# Patient Record
Sex: Female | Born: 1945
Health system: Southern US, Community
[De-identification: ages and names within clinical notes are randomized; demographics above are authoritative.]

## PROBLEM LIST (undated history)

## (undated) DIAGNOSIS — I739 Peripheral vascular disease, unspecified: Secondary | ICD-10-CM

## (undated) DIAGNOSIS — J189 Pneumonia, unspecified organism: Secondary | ICD-10-CM

## (undated) DIAGNOSIS — F17200 Nicotine dependence, unspecified, uncomplicated: Secondary | ICD-10-CM

## (undated) DIAGNOSIS — C140 Malignant neoplasm of pharynx, unspecified: Secondary | ICD-10-CM

## (undated) DIAGNOSIS — E785 Hyperlipidemia, unspecified: Secondary | ICD-10-CM

## (undated) DIAGNOSIS — R0989 Other specified symptoms and signs involving the circulatory and respiratory systems: Secondary | ICD-10-CM

## (undated) HISTORY — DX: Other specified symptoms and signs involving the circulatory and respiratory systems: R09.89

## (undated) HISTORY — PX: PEG TUBE PLACEMENT: SUR1034

## (undated) HISTORY — PX: PEG TUBE REMOVAL: SHX2187

## (undated) HISTORY — DX: Peripheral vascular disease, unspecified: I73.9

## (undated) HISTORY — PX: AORTA - BILATERAL FEMORAL ARTERY BYPASS GRAFT: SHX1175

## (undated) HISTORY — DX: Hyperlipidemia, unspecified: E78.5

## (undated) HISTORY — DX: Nicotine dependence, unspecified, uncomplicated: F17.200

## (undated) HISTORY — PX: TONSILLECTOMY: SUR1361

## (undated) HISTORY — PX: COLONOSCOPY: SHX174

## (undated) HISTORY — PX: TOTAL LARYNGECTOMY: SHX2543

## (undated) HISTORY — DX: Malignant neoplasm of pharynx, unspecified: C14.0

---

## 2008-06-13 ENCOUNTER — Ambulatory Visit: Payer: Self-pay | Admitting: Vascular Surgery

## 2008-06-27 ENCOUNTER — Encounter: Admission: RE | Admit: 2008-06-27 | Discharge: 2008-06-27 | Payer: Self-pay | Admitting: Vascular Surgery

## 2008-06-27 ENCOUNTER — Ambulatory Visit: Payer: Self-pay | Admitting: Vascular Surgery

## 2008-08-08 ENCOUNTER — Ambulatory Visit: Payer: Self-pay | Admitting: Vascular Surgery

## 2008-08-09 ENCOUNTER — Encounter: Payer: Self-pay | Admitting: Vascular Surgery

## 2008-08-09 ENCOUNTER — Ambulatory Visit: Payer: Self-pay | Admitting: Vascular Surgery

## 2008-08-09 ENCOUNTER — Inpatient Hospital Stay (HOSPITAL_COMMUNITY): Admission: RE | Admit: 2008-08-09 | Discharge: 2008-08-15 | Payer: Self-pay | Admitting: Vascular Surgery

## 2008-08-29 ENCOUNTER — Ambulatory Visit: Payer: Self-pay | Admitting: Vascular Surgery

## 2008-09-05 ENCOUNTER — Ambulatory Visit: Payer: Self-pay | Admitting: Vascular Surgery

## 2008-12-12 ENCOUNTER — Ambulatory Visit: Payer: Self-pay | Admitting: Vascular Surgery

## 2009-02-26 ENCOUNTER — Ambulatory Visit: Payer: Self-pay | Admitting: Vascular Surgery

## 2009-05-29 ENCOUNTER — Ambulatory Visit: Payer: Self-pay | Admitting: Vascular Surgery

## 2009-09-11 ENCOUNTER — Ambulatory Visit: Payer: Self-pay | Admitting: Vascular Surgery

## 2009-12-20 ENCOUNTER — Emergency Department (HOSPITAL_COMMUNITY): Admission: EM | Admit: 2009-12-20 | Discharge: 2009-12-20 | Payer: Self-pay | Admitting: Emergency Medicine

## 2009-12-27 ENCOUNTER — Ambulatory Visit: Payer: Self-pay | Admitting: Cardiology

## 2010-05-29 LAB — CBC
HCT: 39.7 % (ref 36.0–46.0)
MCHC: 33.5 g/dL (ref 30.0–36.0)
Platelets: 178 10*3/uL (ref 150–400)
RDW: 15.6 % — ABNORMAL HIGH (ref 11.5–15.5)
WBC: 16.3 10*3/uL — ABNORMAL HIGH (ref 4.0–10.5)

## 2010-05-29 LAB — COMPREHENSIVE METABOLIC PANEL
ALT: 25 U/L (ref 0–35)
Albumin: 3.7 g/dL (ref 3.5–5.2)
Calcium: 9.5 mg/dL (ref 8.4–10.5)
Glucose, Bld: 216 mg/dL — ABNORMAL HIGH (ref 70–99)
Potassium: 3.7 mEq/L (ref 3.5–5.1)
Sodium: 140 mEq/L (ref 135–145)
Total Protein: 6.1 g/dL (ref 6.0–8.3)

## 2010-05-29 LAB — DIFFERENTIAL
Eosinophils Absolute: 0 10*3/uL (ref 0.0–0.7)
Lymphs Abs: 1.1 10*3/uL (ref 0.7–4.0)
Monocytes Absolute: 1 10*3/uL (ref 0.1–1.0)
Monocytes Relative: 6 % (ref 3–12)
Neutro Abs: 14.2 10*3/uL — ABNORMAL HIGH (ref 1.7–7.7)
Neutrophils Relative %: 87 % — ABNORMAL HIGH (ref 43–77)

## 2010-05-29 LAB — URINALYSIS, ROUTINE W REFLEX MICROSCOPIC
Glucose, UA: 250 mg/dL — AB
Specific Gravity, Urine: 1.034 — ABNORMAL HIGH (ref 1.005–1.030)

## 2010-05-29 LAB — URINE MICROSCOPIC-ADD ON

## 2010-05-29 LAB — URINE CULTURE

## 2010-06-23 LAB — BASIC METABOLIC PANEL
BUN: 8 mg/dL (ref 6–23)
Calcium: 8.1 mg/dL — ABNORMAL LOW (ref 8.4–10.5)
Creatinine, Ser: 0.54 mg/dL (ref 0.4–1.2)
GFR calc non Af Amer: >60 mL/min (ref >60)

## 2010-06-23 LAB — CBC
MCV: 91 fL (ref 78.0–100.0)
Platelets: 219 10*3/uL (ref 150–400)
WBC: 8.2 10*3/uL (ref 4.0–10.5)

## 2010-06-23 LAB — BRAIN NATRIURETIC PEPTIDE: Pro B Natriuretic peptide (BNP): 109 pg/mL — ABNORMAL HIGH (ref 0.0–100.0)

## 2010-06-23 LAB — GLUCOSE, CAPILLARY
Glucose-Capillary: 115 mg/dL — ABNORMAL HIGH (ref 70–99)
Glucose-Capillary: 119 mg/dL — ABNORMAL HIGH (ref 70–99)

## 2010-06-24 LAB — CBC
HCT: 26.8 % — ABNORMAL LOW (ref 36.0–46.0)
HCT: 27.3 % — ABNORMAL LOW (ref 36.0–46.0)
HCT: 29.9 % — ABNORMAL LOW (ref 36.0–46.0)
Hemoglobin: 10.1 g/dL — ABNORMAL LOW (ref 12.0–15.0)
Hemoglobin: 7.7 g/dL — CL (ref 12.0–15.0)
Hemoglobin: 9.2 g/dL — ABNORMAL LOW (ref 12.0–15.0)
Hemoglobin: 9.4 g/dL — ABNORMAL LOW (ref 12.0–15.0)
Hemoglobin: 9.5 g/dL — ABNORMAL LOW (ref 12.0–15.0)
MCHC: 33.5 g/dL (ref 30.0–36.0)
MCHC: 34.2 g/dL (ref 30.0–36.0)
MCV: 86.4 fL (ref 78.0–100.0)
MCV: 87.5 fL (ref 78.0–100.0)
MCV: 88.2 fL (ref 78.0–100.0)
Platelets: 66 10*3/uL — ABNORMAL LOW (ref 150–400)
Platelets: 73 10*3/uL — ABNORMAL LOW (ref 150–400)
RBC: 2.57 MIL/uL — ABNORMAL LOW (ref 3.87–5.11)
RBC: 3.04 MIL/uL — ABNORMAL LOW (ref 3.87–5.11)
RBC: 3.12 MIL/uL — ABNORMAL LOW (ref 3.87–5.11)
RBC: 3.37 MIL/uL — ABNORMAL LOW (ref 3.87–5.11)
RBC: 3.46 MIL/uL — ABNORMAL LOW (ref 3.87–5.11)
RBC: 5.11 MIL/uL (ref 3.87–5.11)
RDW: 14.3 % (ref 11.5–15.5)
RDW: 14.4 % (ref 11.5–15.5)
WBC: 10.1 10*3/uL (ref 4.0–10.5)
WBC: 10.9 10*3/uL — ABNORMAL HIGH (ref 4.0–10.5)
WBC: 6.9 10*3/uL (ref 4.0–10.5)
WBC: 9.9 10*3/uL (ref 4.0–10.5)

## 2010-06-24 LAB — BASIC METABOLIC PANEL
BUN: 3 mg/dL — ABNORMAL LOW (ref 6–23)
BUN: 5 mg/dL — ABNORMAL LOW (ref 6–23)
CO2: 19 mEq/L (ref 19–32)
CO2: 29 mEq/L (ref 19–32)
Calcium: 6.1 mg/dL — CL (ref 8.4–10.5)
Calcium: 8.1 mg/dL — ABNORMAL LOW (ref 8.4–10.5)
Chloride: 103 mEq/L (ref 96–112)
Chloride: 108 mEq/L (ref 96–112)
Chloride: 109 mEq/L (ref 96–112)
Creatinine, Ser: 0.56 mg/dL (ref 0.4–1.2)
GFR calc Af Amer: >60 mL/min (ref >60)
GFR calc Af Amer: >60 mL/min (ref >60)
GFR calc Af Amer: >60 mL/min (ref >60)
GFR calc non Af Amer: >60 mL/min (ref >60)
Glucose, Bld: 147 mg/dL — ABNORMAL HIGH (ref 70–99)
Potassium: 3 mEq/L — ABNORMAL LOW (ref 3.5–5.1)
Potassium: 3.9 mEq/L (ref 3.5–5.1)
Potassium: 4.1 mEq/L (ref 3.5–5.1)
Sodium: 134 mEq/L — ABNORMAL LOW (ref 135–145)
Sodium: 136 mEq/L (ref 135–145)
Sodium: 137 mEq/L (ref 135–145)
Sodium: 138 mEq/L (ref 135–145)
Sodium: 139 mEq/L (ref 135–145)

## 2010-06-24 LAB — GLUCOSE, CAPILLARY
Glucose-Capillary: 120 mg/dL — ABNORMAL HIGH (ref 70–99)
Glucose-Capillary: 124 mg/dL — ABNORMAL HIGH (ref 70–99)
Glucose-Capillary: 130 mg/dL — ABNORMAL HIGH (ref 70–99)
Glucose-Capillary: 132 mg/dL — ABNORMAL HIGH (ref 70–99)
Glucose-Capillary: 142 mg/dL — ABNORMAL HIGH (ref 70–99)
Glucose-Capillary: 146 mg/dL — ABNORMAL HIGH (ref 70–99)
Glucose-Capillary: 162 mg/dL — ABNORMAL HIGH (ref 70–99)

## 2010-06-24 LAB — TYPE AND SCREEN
ABO/RH(D): AB NEG
Antibody Screen: POSITIVE
Donor AG Type: NEGATIVE
Donor AG Type: NEGATIVE
PT AG Type: NEGATIVE

## 2010-06-24 LAB — POCT I-STAT 3, ART BLOOD GAS (G3+)
Acid-Base Excess: 2 mmol/L (ref 0.0–2.0)
Acid-base deficit: 4 mmol/L — ABNORMAL HIGH (ref 0.0–2.0)
Acid-base deficit: 8 mmol/L — ABNORMAL HIGH (ref 0.0–2.0)
Bicarbonate: 17.1 mEq/L — ABNORMAL LOW (ref 20.0–24.0)
Bicarbonate: 22.3 mEq/L (ref 20.0–24.0)
Bicarbonate: 25.2 mEq/L — ABNORMAL HIGH (ref 20.0–24.0)
O2 Saturation: 94 %
O2 Saturation: 95 %
O2 Saturation: 96 %
O2 Saturation: 96 %
Patient temperature: 98.4
TCO2: 18 mmol/L (ref 0–100)
TCO2: 19 mmol/L (ref 0–100)
TCO2: 21 mmol/L (ref 0–100)
TCO2: 24 mmol/L (ref 0–100)
pCO2 arterial: 36.3 mmHg (ref 35.0–45.0)
pCO2 arterial: 37.3 mmHg (ref 35.0–45.0)
pH, Arterial: 7.267 — ABNORMAL LOW (ref 7.350–7.400)
pH, Arterial: 7.308 — ABNORMAL LOW (ref 7.350–7.400)
pO2, Arterial: 102 mmHg — ABNORMAL HIGH (ref 80.0–100.0)
pO2, Arterial: 129 mmHg — ABNORMAL HIGH (ref 80.0–100.0)
pO2, Arterial: 67 mmHg — ABNORMAL LOW (ref 80.0–100.0)
pO2, Arterial: 81 mmHg (ref 80.0–100.0)
pO2, Arterial: 86 mmHg (ref 80.0–100.0)
pO2, Arterial: 94 mmHg (ref 80.0–100.0)

## 2010-06-24 LAB — COMPREHENSIVE METABOLIC PANEL
ALT: 12 U/L (ref 0–35)
ALT: 9 U/L (ref 0–35)
AST: 16 U/L (ref 0–37)
AST: 20 U/L (ref 0–37)
CO2: 19 mEq/L (ref 19–32)
CO2: 22 mEq/L (ref 19–32)
Calcium: 9.9 mg/dL (ref 8.4–10.5)
Chloride: 106 mEq/L (ref 96–112)
GFR calc Af Amer: >60 mL/min (ref >60)
GFR calc Af Amer: >60 mL/min (ref >60)
GFR calc non Af Amer: >60 mL/min (ref >60)
Sodium: 133 mEq/L — ABNORMAL LOW (ref 135–145)
Sodium: 142 mEq/L (ref 135–145)
Total Bilirubin: 1.8 mg/dL — ABNORMAL HIGH (ref 0.3–1.2)
Total Protein: 6.8 g/dL (ref 6.0–8.3)

## 2010-06-24 LAB — POCT I-STAT 4, (NA,K, GLUC, HGB,HCT): Hemoglobin: 10.5 g/dL — ABNORMAL LOW (ref 12.0–15.0)

## 2010-06-24 LAB — POCT I-STAT 7, (LYTES, BLD GAS, ICA,H+H)
Acid-base deficit: 1 mmol/L (ref 0.0–2.0)
Bicarbonate: 26.2 mEq/L — ABNORMAL HIGH (ref 20.0–24.0)
Calcium, Ion: 1.21 mmol/L (ref 1.12–1.32)
HCT: 31 % — ABNORMAL LOW (ref 36.0–46.0)
Hemoglobin: 10.5 g/dL — ABNORMAL LOW (ref 12.0–15.0)
O2 Saturation: 100 %
Patient temperature: 35.1
Patient temperature: 35.2
Potassium: 3.9 mEq/L (ref 3.5–5.1)
Sodium: 138 mEq/L (ref 135–145)
pCO2 arterial: 49.1 mmHg — ABNORMAL HIGH (ref 35.0–45.0)
pH, Arterial: 7.346 — ABNORMAL LOW (ref 7.350–7.400)
pO2, Arterial: 447 mmHg — ABNORMAL HIGH (ref 80.0–100.0)
pO2, Arterial: 482 mmHg — ABNORMAL HIGH (ref 80.0–100.0)

## 2010-06-24 LAB — BLOOD GAS, ARTERIAL
Drawn by: 181601
FIO2: 0.21 %
O2 Saturation: 97.5 %
pCO2 arterial: 36.7 mmHg (ref 35.0–45.0)
pO2, Arterial: 83.4 mmHg (ref 80.0–100.0)

## 2010-06-24 LAB — PROTIME-INR
INR: 1.7 — ABNORMAL HIGH (ref 0.00–1.49)
Prothrombin Time: 18.5 seconds — ABNORMAL HIGH (ref 11.6–15.2)

## 2010-06-24 LAB — APTT: aPTT: 40 seconds — ABNORMAL HIGH (ref 24–37)

## 2010-06-24 LAB — CARDIAC PANEL(CRET KIN+CKTOT+MB+TROPI)
Total CK: 431 U/L — ABNORMAL HIGH (ref 7–177)
Troponin I: 0.03 ng/mL (ref 0.00–0.06)

## 2010-06-24 LAB — AMYLASE: Amylase: 58 U/L (ref 27–131)

## 2010-07-09 IMAGING — CR DG CHEST 1V PORT
1 series · 1 of 1 positions shown · non-contrast
Comparison: 08/09/2008.

CLINICAL DATA: Postop aortic bypass surgery.  Swan-Ganz catheter
placement.

PORTABLE CHEST - 1 VIEW

[view not recorded]
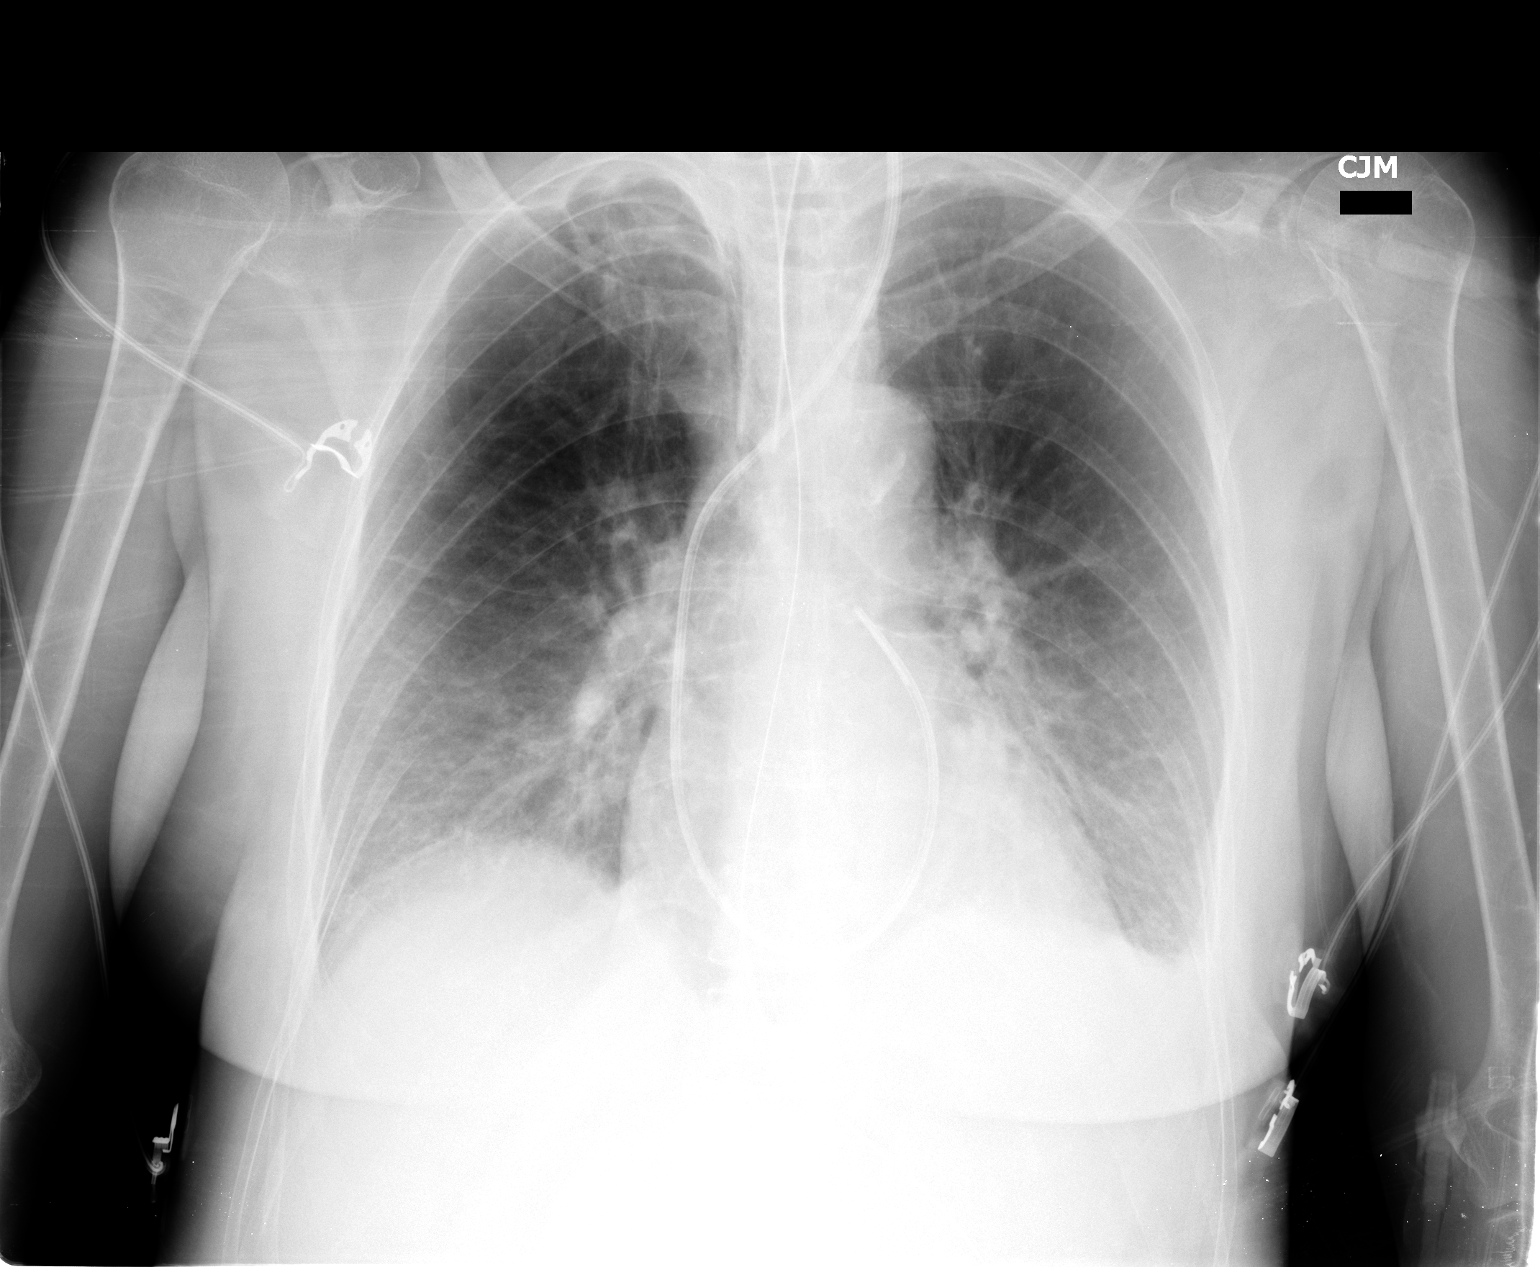

[1 of 1 positions shown; findings below may reference images not displayed]

FINDINGS: 6065 hours.  The tip of the endotracheal tube is 2.0 cm
above the carina.  A left internal jugular Swan-Ganz catheter tip
is in the main pulmonary artery.  A nasogastric tube projects below
the diaphragm.  There is bibasilar atelectasis with small bilateral
pleural effusions.  No pneumothorax is present.  The heart size and
mediastinal contours appear stable allowing for the lower lung
volumes.  Right apical scarring appears stable.
IMPRESSION: 1.  Support system positioned as above.
2.  Small pleural effusions and basilar atelectasis.  No
pneumothorax.

## 2010-07-28 ENCOUNTER — Telehealth: Payer: Self-pay | Admitting: Cardiology

## 2010-07-28 NOTE — Telephone Encounter (Signed)
Patient was prescribed Lipitor, Zetia, and Meteprolol, does she need to continue to take these.  Last seen Oct 2011.  Please call, they were not sure how long she has to take these.

## 2010-07-29 ENCOUNTER — Telehealth: Payer: Self-pay | Admitting: Cardiology

## 2010-07-29 NOTE — Discharge Summary (Signed)
NAMEMATEJA, DIER               ACCOUNT NO.:  [id number]   MEDICAL RECORD NO.:  [id number]          PATIENT TYPE:  INP   LOCATION:  2030                         FACILITY:  [facility]   PHYSICIAN:  Janetta Hora. Fields, MD  DATE OF BIRTH:  March 22, 1945   DATE OF ADMISSION:  08/09/2008  DATE OF DISCHARGE:  08/15/2008                               DISCHARGE SUMMARY   FINAL DISCHARGE DIAGNOSES:  1. Chronic aortic occlusion with risk of limb loss.  2. Dyslipidemia.   PROCEDURES PERFORMED:  Aortobifemoral bypass grafting with bilateral  common femoral endarterectomy with right femoral to above knee popliteal  bypass grafting with nonreversed right greater saphenous vein.  Intraoperative arteriogram x1 by Dr. Darrick Penna, Aug 09, 2008.   COMPLICATIONS:  None.   CONDITION ON DISCHARGE:  Stable, improving.   DISCHARGE MEDICATIONS:  1. Metoprolol succinate 25 mg p.o. q.a.m.  2. Lipitor 40 mg p.o. q.p.m.  3. Aspirin 81 mg p.o. q.p.m.  4. Aleve p.r.n.  5. Percocet 5/325 one p.o. q.4 h. p.r.n. pain, total #40 were given.   DISPOSITION:  She is being discharged home in stable condition with her  wounds healing well.  She is given careful instructions regarding the  care of her wounds and her activity level.  She is given a return  appointment with Dr. Darrick Penna in 2 weeks for staple removal, ABIs, and  followup.   BRIEF IDENTIFYING STATEMENT:  For complete details, please refer the  typed history and physical.  Briefly, this 65 year old woman was  referred to Dr. Darrick Penna with chronic aortic occlusion.  Dr. Darrick Penna found  her to have a very high risk for limb loss.  He recommended  aortobifemoral bypass grafting with a right femoral through popliteal  bypass grafting and endarterectomies of both common femoral arteries.  She was informed of the risks and benefits of the procedure and after  careful consideration, she elected to proceed with surgery.   HOSPITAL COURSE:  Preoperative workup was completed  as an outpatient.  She was brought in through same-day surgery and underwent the  aforementioned revascularization procedure.  For complete details,  please refer the typed operative report.  The procedure was without  complications.  She was returned to an intensive care unit in critical,  but hemodynamically stable condition.  Mechanical ventilation was  weaned, since she was able to be extubated on the first postoperative  day.  She was transferred to a bed on the surgical step down unit, Jul 16, 2008.  On August 14, 2008, she was able to be transferred to a bed on a  surgical convalescent floor.   Postoperatively, she did have some oxygenation issues which eventually  resolved.  Her activity level and diet were advanced as she tolerated.  By August 15, 2008, she was walking and improving with physical therapy.  She was tolerating a regular diet.  Her wounds were healing well and she  was felt stable for discharge.  She was discharged in stable condition  on August 15, 2008.      Wilmon Arms, PA      Leonette Most  Holland Commons, MD  Electronically Signed    KEL/MEDQ  D:  08/15/2008  T:  08/15/2008  Job:  [id number]   cc:   Janetta Hora. Darrick Penna, MD

## 2010-07-29 NOTE — Assessment & Plan Note (Signed)
OFFICE VISIT   Heather Jimenez, Heather Jimenez  DOB:  [birth year]                                       09/05/2008  ZOXWR#:[id number]   The patient returns for followup today after undergoing aortobifemoral  bypass graft with bilateral common femoral endarterectomy and right  femoral to above knee popliteal bypass with vein.  She recovered well  postoperatively.  She returns today for her postop visit.   On exam today abdominal and groin incisions are well-healed.  The leg  incisions on her right leg are well-healed.  She has a 1+ DP pulse in  the right foot.  She states that she essentially has no claudication  symptoms at this point.  The ulcer on her right toe is almost healed at  this point.  ABIs today were 1.02 on the right and 0.71 on the left.  These have improved dramatically from 0 on the right and 0.37 on the  left preoperatively.  She had biphasic waveforms bilaterally.   Overall the patient seems to be doing well.  Unfortunately she continues  to smoke and I counseled her against this today.  She will follow up on  our graft surveillance protocol in three months' time.   Janetta Hora. Fields, MD  Electronically Signed   CEF/MEDQ  D:  09/05/2008  T:  09/06/2008  Job:  [id number]   cc:   Gillis Ends, Dr  Elmore Guise., M.D.

## 2010-07-29 NOTE — Assessment & Plan Note (Signed)
OFFICE VISIT   TERISSA, HAFFEY  DOB:  17-Jan-1946                                       08/08/2008  ZOXWR#:[id number]   The patient was last seen on 06/27/2008.  At that time it was  recommended to her she needed aortobifemoral bypass grafting.  However,  she refused at that time.  The patient now presents and is now willing  to undergo the procedure.  Unfortunately she has had some deterioration  of her right foot.  She has developed an ulceration on the medial aspect  of her right first toe and she has some erythema in the foot as well.   I believe the best option for limb salvage is aortobifemoral bypass  grafting and possibly a right femoral-popliteal bypass as well.  Risks,  benefits, possible complications and procedure details including but not  limited to bleeding, infection, myocardial infarction, limb loss were  explained to the patient today.  She understands and agrees to proceed.  The procedure is scheduled for 08/09/2008.  A full history and physical  is dictated on the [facility] system.   Janetta Hora. Fields, MD  Electronically Signed   CEF/MEDQ  D:  08/08/2008  T:  08/09/2008  Job:  [id number]

## 2010-07-29 NOTE — Procedures (Signed)
BYPASS GRAFT EVALUATION   INDICATION:  Follow up right lower extremity bypass graft.   HISTORY:  Diabetes:  No.  Cardiac:  No.  Hypertension:  No.  Smoking:  Yes.  Previous Surgery:  Right femoral to above-knee bypass graft, 08/09/08.   SINGLE LEVEL ARTERIAL EXAM                               RIGHT              LEFT  Brachial:                    113                118  Anterior tibial:             110                78  Posterior tibial:            96                 111  Peroneal:  Ankle/brachial index:        0.93               0.94   PREVIOUS ABI:  Date: 05/29/09  RIGHT:  0.93  LEFT:  0.76   LOWER EXTREMITY BYPASS GRAFT DUPLEX EXAM:   DUPLEX:  Patent right femoral to above-knee bypass graft with biphasic  waveforms noted proximal and within the graft.  There are elevated  velocities of 318 cm at the distal anastomosis.   IMPRESSION:  1. Patient's right ankle brachial index is stable; however, the left      ankle brachial index has increased from previous study.  2. Stable right femoral to above-knee bypass graft with elevated      velocity, as noted above.    ___________________________________________  Janetta Hora. Fields, MD   CB/MEDQ  D:  09/11/2009  T:  09/11/2009  Job:  [id number]

## 2010-07-29 NOTE — Procedures (Signed)
CAROTID DUPLEX EXAM   INDICATION:  Bruit.   HISTORY:  Diabetes:  No.  Cardiac:  No.  Hypertension:  No.  Smoking:  Yes.  Previous Surgery:  __________.  CV History:  Asymptomatic  Amaurosis Fugax No, Paresthesias No, Hemiparesis No.                                       RIGHT             LEFT  Brachial systolic pressure:         120               102  Brachial Doppler waveforms:         Normal.           Abnormal.  Vertebral direction of flow:        Antegrade.  Antegrade/atypical.  DUPLEX VELOCITIES (cm/sec)  CCA peak systolic                   79.               100.  ECA peak systolic                   354.              137.  ICA peak systolic                   167.              86.  ICA end diastolic                   40.               27.  PLAQUE MORPHOLOGY:                  Mixed.            Mixed.  PLAQUE AMOUNT:                      Moderate.         Mild.  PLAQUE LOCATION:                    ICA/ECA.          ICA/ECA.   IMPRESSION:  1. 40 TO 59% stenosis of the internal carotid artery.  2. 1 to 39% stenosis of the left internal carotid artery.  3. There is an elevated velocity of greater than 300 cm per second      noted in the left proximal subclavian artery with the antegrade      left vetebral artery demonstrating early systolic deceleration.  4. A mild difference in the bilateral brachial pressures is noted.   ___________________________________________    Janetta Hora Fields, MD   CH/MEDQ  D:  06/13/2008  T:  06/13/2008  Job:  [id number]

## 2010-07-29 NOTE — Procedures (Signed)
BYPASS GRAFT EVALUATION   INDICATION:  Followup lower extremity bypass graft.   HISTORY:  Diabetes:  No.  Cardiac:  No.  Hypertension:  No.  Smoking:  Yes.  Previous Surgery:  Aortobifemoral and right fem-pop bypass graft on  08/09/2008.   SINGLE LEVEL ARTERIAL EXAM                               RIGHT              LEFT  Brachial:                    123                120  Anterior tibial:             117                100  Posterior tibial:            Not detected       104  Peroneal:                    116  Ankle/brachial index:        0.95               0.85   PREVIOUS ABI:  Date:  12/12/2008  RIGHT:  1.03  LEFT:  0.66   LOWER EXTREMITY BYPASS GRAFT DUPLEX EXAM:   DUPLEX:  Biphasic Doppler waveforms noted throughout the aortobifemoral  bypass graft with no increased velocities noted.  Biphasic Doppler waveforms noted throughout the right lower extremity  bypass graft with an increased velocity of 327 cm/s noted at the distal  anastomosis.   IMPRESSION:  1. Patent aortobifemoral and right femoral-popliteal bypass grafts      with an increased velocity noted, as described above.  2. Stable right ankle brachial index with a significant increase in      the left ankle brachial index noted.   ___________________________________________  Janetta Hora Fields, MD   CH/MEDQ  D:  02/26/2009  T:  02/27/2009  Job:  [id number]

## 2010-07-29 NOTE — Assessment & Plan Note (Signed)
OFFICE VISIT   Heather Jimenez, Heather Jimenez  DOB:  [birth year]                                       06/27/2008  UEAVW#:[id number]   The patient is a 65 year old female who was last seen on March 31 with  signs and symptoms of aortic occlusion.  She returns today for further  followup after a CT angiogram.   On physical exam today blood pressure is 105/72, pulse is 121 and  regular.  Lower extremities:  She has some dependent rubor of the right  foot.  Left foot is pale.  She has absent femoral and pedal pulses  bilaterally.  She has no ulcerations on the feet.   Review of her CT angiogram today shows a distal aortic occlusion with a  large amount of thrombus lining the infrarenal aorta and extending up  into the suprarenal aorta.  She has bilateral severe common femoral  disease.  The profunda femoris arteries are patent bilaterally.  She has  bilateral superficial femoral artery occlusions with two-vessel runoff  to the right and three-vessel runoff to the left.   I discussed with the patient today that she would need aortobifemoral  bypass grafting with most likely bilateral femoral endarterectomies and  possible profundoplasty.  Risks, benefits, possible complications and  procedure details were explained to the patient including bleeding,  infection, graft thrombosis, limb loss, myocardial events, pulmonary  events.  She understands and agrees to proceed.  She is scheduled for a  cardiac stress test on April 19 with [facility].  She will  than have had an echocardiogram on April 20 to evaluate her possible  aortic stenosis.  Tentatively her aortobifemoral bypass is scheduled for  April 27.   Janetta Hora. Fields, MD  Electronically Signed   CEF/MEDQ  D:  06/28/2008  T:  06/28/2008  Job:  [id number]   cc:   Elmore Guise., M.D.  Dr Gillis Ends

## 2010-07-29 NOTE — H&P (Signed)
Heather Jimenez, Heather Jimenez NO.:  [id number]   MEDICAL RECORD NO.:  [id number]          PATIENT TYPE:  INP   LOCATION:  2899                         FACILITY:  [facility]   PHYSICIAN:  Janetta Hora. Fields, MD  DATE OF BIRTH:  [birth year]   DATE OF ADMISSION:  08/09/2008  DATE OF DISCHARGE:                              HISTORY & PHYSICAL   The patient is a 65 year old female who was initially seen on June 13, 2008.  She was referred for right foot pain which had been going on for  approximately 3 weeks.  Her past medical history is primarily  significant for heavy smoking up to a pack and a half per day for many  years.  At that time, she denied any problems with nonhealing wounds on  her foot.  She denies history of hypertension, elevated cholesterol, or  diabetes.  Since that time, she still does not really have significant  pain in her foot, but says it hurts occasionally and this is relieved by  1 or 2 aspirin.  However, she has had deterioration of her right foot  with development of an ulceration on the medial aspect of her right  first toe.  She has no symptoms in her left leg.  Recent CT angiogram  showed distal aortic occlusion with a large amount of thrombus lying in  the infrarenal aorta extending into the suprarenal aorta.  She also has  bilateral severe common femoral disease.  The profunda femoris is patent  bilaterally.  She has bilateral superficial femoral artery occlusions  with 2-vessel runoff to the right foot and 3-vessel runoff to the left  foot.   PAST SURGICAL HISTORY:  She had a tonsillectomy.  She had a left foot  abscess 3 years ago.   MEDICATIONS:  She takes Darvocet 1 tablet 4 times a day, Cipro which has  now been discontinued, aspirin 325 mg once a day.   PHYSICAL EXAMINATION:  VITAL SIGNS:  Blood pressure is 129/77 in the  left arm, pulse is 74 and regular.  HEENT:  Unremarkable.  NECK:  2+ carotid pulses with bilateral carotid bruits.  CHEST:  Clear to auscultation.  CARDIAC:  A 4/6 systolic murmur heard throughout the precordium  radiating to the neck.  She has 2+ brachial pulses bilaterally.  She has  a 2+ right radial pulse.  She has a 1+ left radial pulse.  ABDOMEN:  Thin, soft, nontender, and nondistended.  Good palpable aortic  pulsation.  She has no masses.  EXTREMITIES:  She has absent femoral popliteal and pedal pulses  bilaterally.  Feet are cool bilaterally.  She has dependent rubor in the  right forefoot.  She has an ulceration on the medial aspect of the right  first toe with no purulent drainage, but some clearish discharge.   She had bilateral ABIs performed on June 14, 2008, which were 0.37 on  the left with absent Doppler flow on the right.  She had a mild blood  pressure discrepancy with the right arm being 120 mmHg and the left  being 120 mmHg suggesting left  subclavian stenosis.  She also had a  carotid duplex exam which showed a 40-60% right internal carotid artery  stenosis and less than 40% left internal carotid artery stenosis.   She also recently had a full cardiology evaluation by Dr. Reyes Ivan with  [facility].  This included an echocardiogram and stress test  which are included with her preoperative packet.   In summary, the patient has a chronic aortic occlusion and has risk of  limb loss especially in her right lower extremity.  Plan is for  aortobifemoral bypass grafting which will most likely also require  bilateral femoral endarterectomy and potentially right femoral popliteal  bypass grafting as well.  Risks, benefits, possible  complications, and procedure details were explained to the patient today  including but not limited to bleeding, infection, limb loss, myocardial  infarction.  She understands and agrees to proceed.  Procedure is  scheduled for Aug 09, 2008.      Janetta Hora. Fields, MD  Electronically Signed     CEF/MEDQ  D:  08/08/2008  T:  08/09/2008  Job:   [id number]

## 2010-07-29 NOTE — Assessment & Plan Note (Signed)
OFFICE VISIT   Heather Jimenez, Heather Jimenez  DOB:  1946/02/14                                       06/13/2008  FAOZH#:[id number]   The patient is a 65 year old female referred by Dr. Gillis Ends for  right foot pain for 3 weeks.  She was previously seen in the emergency  room in [place] 2 days ago and was put on Cipro and Darvocet.  She was  seen by Dr. Manson Passey with [facility] urgent care during that visit.  The  patient denies any claudication symptoms.  However, she has been  developing rest pain in her right foot for approximately 3 weeks.  She  denies history of hypertension, diabetes, elevated cholesterol or  coronary disease.  She is a heavy smoker and has been a pack and a half  per day smoker for many years.  She has not really had any problems with  nonhealing wounds on her foot.   PAST MEDICAL HISTORY:  Is otherwise fairly unremarkable.   PAST SURGICAL HISTORY:  She had a tonsillectomy.  She had a left foot  abscess 3 years ago.   MEDICATIONS:  1. Darvocet 1 tablet four times a day.  2. Cipro 1 tablet twice a day.  3. She is also currently on aspirin 325 mg once a day as well.   ALLERGIES:  She has no known drug allergies.   FAMILY HISTORY:  Unremarkable.   SOCIAL HISTORY:  She is married and has five children.  Smoking history  is as above.  She does not consume alcohol regularly.   REVIEW OF SYSTEMS:  CONSTITUTIONAL:  She is 5 feet 3 inches, 102 pounds.  Cardiac, GI, renal, neurologic, orthopedic, psychiatric, ENT and  hematologic review of systems are all negative.  VASCULAR:  She denies history of TIA or stroke or DVT.  PULMONARY:  She denies history of asthma or COPD but cannot climb two  flights of stairs without becoming short of breath.   PHYSICAL EXAM:  Blood pressure is 102/72 in the left arm, pulse is 98  and regular.  HEENT is unremarkable.  Neck has bilateral carotid bruits.  Chest is clear to auscultation.  She has a 4/6 systolic murmur  heard  throughout the precordium and radiating to her neck.  She has 2+  brachial pulses bilaterally.  She has a 2+ right radial pulse.  She has  a 1+ left radial pulse.  Abdomen is thin, soft, nontender, nondistended  with palpable aortic pulsation.  She has no masses.  Extremities, she  has absent femoral, popliteal and pedal pulses bilaterally.  Feet are  cool with no edema.  She has no ulcerations on the feet.  The color is  slightly dusky bilaterally.   She had bilateral ABIs performed today which were 0.37 on the left and  absent Doppler flow on the right side.  She also had mild blood pressure  discrepancy with a systolic pressure of 120 mmHg on the right side and  102 mmHg on the left side suggesting left subclavian stenosis.  In light  of her bilateral carotid bruits we also did a carotid duplex exam which  showed a 40-60% right internal carotid artery stenosis, less than 40%  left internal carotid artery stenosis with antegrade vertebral flow  bilaterally and again some suggestion of left proximal subclavian  stenosis.  In summary, the patient has evidence clinically of aortic occlusion.  This is chronic in nature due to her overall symptoms.  Her primary risk  factor is smoking.  I discussed with her at length today smoking  cessation.  I also gave her a brochure and numbers for the [place]  smoking cessation clinic.  She will most likely need aortobifemoral  bypass to reperfuse her lower extremities.  Since she also has a very  harsh cardiac murmur and certainly has cardiac risk factors I have also  scheduled her an appointment with [facility] for a  preoperative evaluation and further evaluation of her cardiac murmur.  She apparently did have rheumatic fever as a child and may have some  valvular compromise secondary to this or her atherosclerosis.  She will  return for followup with me after her cardiology evaluation.  We will  also arrange a CT angiogram  of the abdomen, pelvis with lower extremity  runoff for further evaluation of her aortic occlusive disease.   Janetta Hora. Fields, MD  Electronically Signed   CEF/MEDQ  D:  06/14/2008  T:  06/14/2008  Job:  [id number]   cc:   Dr Gillis Ends

## 2010-07-29 NOTE — Op Note (Signed)
Heather Jimenez, Heather Jimenez               ACCOUNT NO.:  [id number]   MEDICAL RECORD NO.:  [id number]          PATIENT TYPE:  INP   LOCATION:  2316                         FACILITY:  [facility]   PHYSICIAN:  Janetta Hora. Fields, MD  DATE OF BIRTH:  1945-11-07   DATE OF PROCEDURE:  08/09/2008  DATE OF DISCHARGE:                               OPERATIVE REPORT   PROCEDURES:  1. Aortobifemoral bypass graft.  2. Bilateral common femoral endarterectomy.  3. Right femoral to above-knee popliteal bypass with nonreversed right      greater saphenous vein.  4. Intraoperative arteriogram x1.   PREOPERATIVE DIAGNOSIS:  Bilateral lower extremity ischemia with tissue  loss, right foot.   POSTOPERATIVE DIAGNOSIS:  Bilateral lower extremity ischemia with tissue  loss, right foot.   ANESTHESIA:  General.   SURGEON:  Charles E. Fields, MD   ASSISTANT:  1. Larina Earthly, MD  2. Wilmon Arms, PA-C   OPERATIVE FINDINGS:  1. 16 x 8-mm Dacron graft, end-to-side anastomosis.  2. 2.5 x 3-mm right greater saphenous vein.  3. Patent right femoral to above-knee popliteal bypass with three-      vessel runoff to the right foot.   OPERATIVE DETAILS:  After obtaining informed consent, the patient was  taken to the operating room.  The patient was placed in supine position  on the operating table.  After induction of general anesthesia and  endotracheal intubation, the patient was prepped from the nipples down  to the toes bilaterally.  Next, a right groin incision was made over the  area of the right common femoral artery.  This was carried down through  the subcutaneous tissues down to the level of the artery.  The artery  was dissected free circumferentially.  The artery was occluded and  heavily calcified with atherosclerotic plaque.  The artery was dissected  free circumferentially and dissection carried up onto the level of the  external iliac artery underneath the inguinal ligament.  Circumflex  iliac  branches were dissected free circumferentially and vessel loops  were placed around these.  The profunda femoris and superficial femoral  arteries were also dissected free circumferentially.  The proximal  portion of the profunda femoris artery was heavily calcified, but was  more soft down at the level of the first branch points.  The profunda  femoris artery was approximately 2 mm in diameter.  Next, a similar  incision was made on the left leg.  A longitudinal incision was made  over the level of the left common femoral artery carried down through  the subcutaneous tissues down to the level of the left common femoral  artery.  Common femoral arteries were dissected free circumferentially.  The profunda femoris and superficial femoral arteries were of similar  nature in character as the right side.  These were dissected free  circumferentially and vessel loops were placed around these.  Vessel  loops were also placed around the circumflex iliac branches.  Retroperitoneal tunnels were begun underneath the inguinal ligament and  the circumflex iliac vein crossing the artery was divided between silk  ties bilaterally.  Next, on the right side, the saphenofemoral junction  was dissected free.  Small side branches were ligated and divided  between silk ties.  Moist Ray-Tec sponges were then placed in each groin  and a midline laparotomy incision was then performed extending from the  xiphoid down to the pubis.  This was carried down through the  subcutaneous tissues.  There were several large collateral branches in  the subcutaneous tissues and bleeding was controlled with cautery.  Incision was carried down at the level of the fascia.  The fascia and  peritoneum were opened with a full lengthy incision.  Abdomen was  inspected.  There were mild changes of the liver consistent with  possible early cirrhotic changes.  There was also some blunting of the  edge of the liver.  The remainder of  the abdomen was without significant  pathology.  The left and right ovaries were small and normal size for  age.  Next, a small bowel was reflected to the right transverse colon,  greater omentum was reflected superiorly and Omni retractor was brought  up in the operative field to assist in retraction.  Dissection was  carried down to the level of the retroperitoneum and the abdominal aorta  was exposed on its anterior surface.  The inferior mesenteric artery was  fairly large in character approximately 3 mm in diameter.  This was  dissected free circumferentially and vessel loop placed around this.  Dissection was carried up to the level of the left renal vein.  This was  retracted slightly, superiorly.  The inferior portion of the right renal  artery was identified and a suitable place for clamping was made just  below this.  Due to the fact that the patient had no significant pelvic  flow, due to the aortic occlusion, it was decided that an end-to-side  anastomosis would be performed.  Next, retroperitoneal tunnels were  created from the abdomen to the left groin and right groin.  These were  retroperitoneal and retroureteral bilaterally.  Umbilical tapes were  placed below these.  The patient was then given 5000 units of  intravenous heparin.  The patient was also given additional units of  heparin during the course with a totaled of 15,000 units of heparin.   Next, the IMA was controlled with vessel loop.  The aorta was clamped  just below the level of the renal arteries and just at the level of the  inferior mesenteric artery.  A longitudinal opening was made in the  abdominal aorta.  There was some thrombus and atherosclerotic debris in  the abdominal aorta at this level and this was thoroughly irrigated and  removed under direct vision.  A 16 x 8-mm Dacron graft was then brought  up in the operative field and sewn end of graft to side of artery using  a running 3-0 Prolene suture.   Just prior to completion of anastomosis,  this was tested and found to be leaking some at the very top portion at  approximately 3 o'clock position.  These were repaired with two  pledgeted sutures.  Next, the clamp was removed and there was good  pulsatile flow in both limbs immediately.  Attention was then turned to  the right limb.  This was brought through the retroperitoneal tunnel  down to the level of the right common femoral artery.  Right common  femoral artery was controlled proximally with a Cooley clamp and the  profunda femoris and superficial femoral artery  was controlled with  vessel loops.  Longitudinal opening was made in the right common femoral  artery and there was a large amount of atherosclerotic plaque  essentially completely obstructing the artery.  Endarterectomy was begun  in a suitable plane and a portion of the distal external iliac artery  was also endarterectomized bluntly.  A good endpoint was made on the  profunda femoris on the right side.  The superficial femoral artery was  chronically occluded.  The Dacron graft was then spatulated on the right  side and sewn end of graft to side of artery using a running 5-0 Prolene  suture.  Just prior to completion of the anastomosis, this was forebled,  backbled, and thoroughly flushed.  Anastomosis was secured.  Clamps were  released.  There was pulsatile flow in the right limb immediately.  Next, attention was turned to the left leg.  In similar fashion, the  left external iliac artery was controlled with a Cooley clamp and the  vessels distally were controlled with vessel loops.  A longitudinal  opening was made in the left common femoral artery and again this was  essentially completely occluded.  Endarterectomy was begun in a suitable  plane and a good endpoint was also obtained on the profunda femoris on  the left.  The left superficial femoral artery was chronically occluded.  Graft on the left side was then  beveled and sewn end of graft to side of  artery using a running 5-0 Prolene suture.  Just prior to completion of  the anastomosis, this was forebled, backbled, and thoroughly flushed.  Anastomosis was secured.  Clamps were released.  There was a pulsatile  flow in the left limb immediately.  The patient had a dorsalis pedis and  posterior tibial Doppler signal on the left side at this point.  Since  the patient had some tissue loss in the right foot, it was decided to  also add a femoral to above-knee popliteal bypass to the right foot for  assistance in healing the wound on her right foot.  At this point, the  greater saphenous vein on the right side was harvested through several  skip incisions down the right leg down to the level of just below the  knee.  The vein became quite diminutive at this level.  The vein was  then harvested from this level all the way to the saphenofemoral  junction, which was ligated with a 2-0 silk tie and transected.  The  vein was then gently distended and inspected.  A longitudinal opening  was made in the hood of the left limb of the aortobifemoral bypass on  the right side.  The vein was spatulated and sewn end of vein to the  side of graft using a running 6-0 Prolene suture.  Just prior to  completion of the anastomosis, this was forebled, backbled, and  thoroughly flushed.  Anastomosis was secured.  Clamps were released.  There was pulsatile flow in the proximal aspect of vein immediately.  Valvulotome was then used to lyse the valves since the vein had been  placed in a nonreversed configuration.  A subsartorial tunnel was  created and the vein was brought through the subsartorial tunnel down to  the level of the popliteal space.  The above-knee popliteal artery was  exposed through deepening one of the saphenectomy incisions and the  artery dissected free circumferentially.  It was then controlled  proximally and distally with fine bulldog clamps.  The artery was fairly  soft in character, but thickened.  The artery was opened longitudinally  and the vein cut to length and sewn end of vein to side of artery using  a running 6-0 Prolene suture.  Just prior to completion of the  anastomosis, this was forebled, backbled, and thoroughly flushed.  Anastomosis was secured.  Clamps were released.  There was pulsatile  flow in the popliteal artery immediately.  The patient had dorsalis  pedis and posterior tibial Doppler signal as well.  Intraoperative  arteriogram was then obtained by introducing a 21-gauge butterfly needle  in the proximal aspect of the vein grafting with inflow occlusion.  Arteriogram was obtained.  This showed a patent popliteal anastomosis of  the above-knee popliteal artery with three-vessel runoff.  At this  point, the needle was removed from the vein graft and the hole was  secured with a single 6-0 Prolene suture.  Next, the patient was given a  total of 150 mg of protamine to obtain hemostasis.  The retroperitoneum  was closed back over the graft in the abdomen.  A piece of bovine  pericardium was wrapped circumferentially around the midportion of the  graft to assist in coverage.  The viscera were then returned to their  normal position and the abdomen was closed with a running #1 PDS suture  in the fascia.  The skin was then closed with staples.  Attention was  then turned to the left groin.  This was closed with multiple layers of  running 2-0 and 3-0 Vicryl suture.  The skin was closed with staples.  One the right lower extremity, the right groin was closed with running 2-  0 and 3-0 Vicryl suture.  The skin was closed with staples.  The  saphenectomy incisions were then closed with running 3-0 Vicryl sutures  and the skin closed with staples.  The patient tolerated the procedure  well and there were no complications.  Instrument, sponge, needle counts  were correct at the end of the case.  The patient was  taken to the  intensive care unit, intubated on a ventilator at the end of the case.      Janetta Hora. Fields, MD  Electronically Signed     CEF/MEDQ  D:  08/10/2008  T:  08/11/2008  Job:  [id number]

## 2010-07-29 NOTE — Procedures (Signed)
BYPASS GRAFT EVALUATION   INDICATION:  Followup lower extremity revascularization.   HISTORY:  Diabetes:  No.  Cardiac:  No.  Hypertension:  No.  Smoking:  Yes.  Previous Surgery:  Right femoral-popliteal artery bypass graft and  aortobifemoral bypass graft 08/09/2008 by Dr. Darrick Penna.   SINGLE LEVEL ARTERIAL EXAM                               RIGHT              LEFT  Brachial:                    130                136  Anterior tibial:             126                93  Posterior tibial:            100                103  Peroneal:  Ankle/brachial index:        0.93               0.76   PREVIOUS ABI:  Date:  02/26/2009  RIGHT:  0.95  LEFT:  0.85   LOWER EXTREMITY BYPASS GRAFT DUPLEX EXAM:   DUPLEX:  Doppler arterial waveforms appear biphasic proximal to, within  and distal to right lower extremity bypass graft.  Stable elevated velocities at the distal anastomosis of 353 cm/s.   IMPRESSION:  1. Patent right femoral-popliteal artery bypass graft with stable      elevated velocities at the distal anastomosis.  2. Fairly stable bilateral ankle brachial indices when compared to      previous studies.  3. No significant changes from previous studies.   ___________________________________________  Janetta Hora Fields, MD   AS/MEDQ  D:  05/29/2009  T:  05/29/2009  Job:  [id number]

## 2010-07-29 NOTE — Procedures (Signed)
BYPASS GRAFT EVALUATION   INDICATION:  Right lower extremity bypass graft.   HISTORY:  Diabetes:  No.  Cardiac:  No.  Hypertension:  No.  Smoking:  Yes.  Previous Surgery:  Right fem-pop bypass graft and aortobifemoral bypass  graft on 08/09/2008.   SINGLE LEVEL ARTERIAL EXAM                               RIGHT              LEFT  Brachial:                    136                125  Anterior tibial:             140                77  Posterior tibial:            116                90  Peroneal:  Ankle/brachial index:        1.03               0.66   PREVIOUS ABI:  Date:  09/05/2008  RIGHT:  1.02  LEFT:  0.71   LOWER EXTREMITY BYPASS GRAFT DUPLEX EXAM:   DUPLEX:  Monophasic Doppler waveforms noted throughout the right lower  extremity bypass graft and its native vessels with no increased  velocities.   IMPRESSION:  1. Patent right femoral-popliteal bypass graft with no evidence of      stenosis.  2. Stable bilateral ankle brachial indices.   ___________________________________________  Janetta Hora Fields, MD   CH/MEDQ  D:  12/12/2008  T:  12/12/2008  Job:  [id number]

## 2010-07-29 NOTE — Telephone Encounter (Signed)
Patient's husband has a question about Heather Jimenez's medications

## 2010-07-29 NOTE — Telephone Encounter (Signed)
Husband called wanting to know if she should stay on metorprolol,lipitor, and zetia. Advised that she was suppose to get lab work last November. Will need to check lab. States her PCP did lab work in April. Advised him to call them and have recent labs sent here. Will advise when receive labs.

## 2010-07-29 NOTE — Procedures (Signed)
CAROTID DUPLEX EXAM   INDICATION:  Followup carotid artery disease.   HISTORY:  Diabetes:  No.  Cardiac:  No.  Hypertension:  No.  Smoking:  Yes.  Previous Surgery:  No carotid surgery.  CV History:  Asymptomatic.  Amaurosis Fugax No, Paresthesias No, Hemiparesis No                                       RIGHT             LEFT  Brachial systolic pressure:         130               136  Brachial Doppler waveforms:         WNL               WNL  Vertebral direction of flow:        Antegrade         Antegrade  DUPLEX VELOCITIES (cm/sec)  CCA peak systolic                   91                88  ECA peak systolic                   314               110  ICA peak systolic                   147               99  ICA end diastolic                   38                29  PLAQUE MORPHOLOGY:                  Calcific          Mixed  PLAQUE AMOUNT:                      Moderate          Mild  PLAQUE LOCATION:                    ICA / ECA / bifurcation             ICA / ECA / bifurcation   IMPRESSION:  1. Right internal carotid artery shows evidence of 40-59% stenosis.  2. Left internal carotid artery shows evidence of 1-39% stenosis.  3. Right external carotid artery stenosis.   ___________________________________________  Janetta Hora Fields, MD   AS/MEDQ  D:  05/29/2009  T:  05/29/2009  Job:  [id number]

## 2010-08-01 ENCOUNTER — Telehealth: Payer: Self-pay | Admitting: *Deleted

## 2010-08-01 NOTE — Telephone Encounter (Signed)
Husband called wanting to know if she should stay on Metoprolol, lipitor and Zetia. Had lab done at Dr. Marigene Ehlers office in March. He had them fax results to [place]. Dr. [place] reviewed and advised to continue all the meds.Advised husband.  Also made an app for her since she was to be scheduled for 6 mo.

## 2010-08-28 ENCOUNTER — Encounter: Payer: Self-pay | Admitting: Cardiology

## 2010-08-28 ENCOUNTER — Encounter

## 2010-09-03 ENCOUNTER — Ambulatory Visit: Payer: Self-pay | Admitting: Cardiology

## 2010-11-05 ENCOUNTER — Other Ambulatory Visit: Payer: Self-pay | Admitting: *Deleted

## 2010-11-05 DIAGNOSIS — E7800 Pure hypercholesterolemia, unspecified: Secondary | ICD-10-CM

## 2010-11-05 MED ORDER — EZETIMIBE 10 MG PO TABS: 10.0000 mg | ORAL_TABLET | Freq: Every day | ORAL | Status: DC

## 2010-11-05 NOTE — Telephone Encounter (Signed)
escribe medication per fax request  

## 2011-01-05 ENCOUNTER — Other Ambulatory Visit: Admitting: *Deleted

## 2011-01-05 ENCOUNTER — Encounter: Payer: Self-pay | Admitting: Cardiology

## 2011-01-05 ENCOUNTER — Other Ambulatory Visit: Payer: Medicare Other | Admitting: *Deleted

## 2011-01-05 ENCOUNTER — Ambulatory Visit (INDEPENDENT_AMBULATORY_CARE_PROVIDER_SITE_OTHER): Payer: Medicare Other | Admitting: Cardiology

## 2011-01-05 VITALS — BP 110/60 | Ht 63.0 in | Wt 80.1 lb

## 2011-01-05 DIAGNOSIS — E7800 Pure hypercholesterolemia, unspecified: Secondary | ICD-10-CM

## 2011-01-05 DIAGNOSIS — E785 Hyperlipidemia, unspecified: Secondary | ICD-10-CM

## 2011-01-05 DIAGNOSIS — I2510 Atherosclerotic heart disease of native coronary artery without angina pectoris: Secondary | ICD-10-CM

## 2011-01-05 DIAGNOSIS — F17200 Nicotine dependence, unspecified, uncomplicated: Secondary | ICD-10-CM

## 2011-01-05 DIAGNOSIS — R070 Pain in throat: Secondary | ICD-10-CM

## 2011-01-05 DIAGNOSIS — I779 Disorder of arteries and arterioles, unspecified: Secondary | ICD-10-CM | POA: Insufficient documentation

## 2011-01-05 DIAGNOSIS — I739 Peripheral vascular disease, unspecified: Secondary | ICD-10-CM

## 2011-01-05 DIAGNOSIS — Z720 Tobacco use: Secondary | ICD-10-CM

## 2011-01-05 NOTE — Assessment & Plan Note (Addendum)
Based on prior stress test showing a fixed inferior wall defect. She has no history of angina. We will continue medical management and followup again in one year.

## 2011-01-05 NOTE — Assessment & Plan Note (Signed)
Status post aortobifemoral bypass grafting. She has no claudication symptoms. She has had no followup with vascular surgery.

## 2011-01-05 NOTE — Patient Instructions (Signed)
Stay on your current medications.  I will see you again in 1 year.

## 2011-01-05 NOTE — Assessment & Plan Note (Signed)
Ongoing tobacco abuse despite a recent diagnosis of throat cancer. I have again encouraged her to quit.

## 2011-01-05 NOTE — Progress Notes (Signed)
   Heather Jimenez Date of Birth: February 20, 1946 Medical Record #[id number]  History of Present Illness: Heather Jimenez is seen today for followup of peripheral vascular disease. She's had previous aortobifemoral bypass grafting. She has a history of tobacco dependence and dyslipidemia. Previous nuclear stress test in 2010 showed a fixed inferior apical defect. Ejection fraction was normal 73%. She was last seen one year ago. Since then she was diagnosed with throat cancer. She underwent a laryngectomy and had some type of skin graft. She lost over 30 pounds. For a time she had a tracheostomy and feeding tube. The feeding tube has since been removed. She is now eating normally. He has regained some of her weight. She currently denies any claudication, chest pain, or shortness of breath. Amazingly she continues to smoke. She has cut down to one pack per day.  Current Outpatient Prescriptions on File Prior to Visit  Medication Sig Dispense Refill  . ACETAMINOPHEN PO Take by mouth as needed.        Marland Kitchen atorvastatin (LIPITOR) 80 MG tablet Take 80 mg by mouth daily.        Marland Kitchen ezetimibe (ZETIA) 10 MG tablet Take 1 tablet (10 mg total) by mouth daily.  30 tablet  2  . gabapentin (NEURONTIN) 300 MG capsule Take 300 mg by mouth daily.        . metoprolol tartrate (LOPRESSOR) 25 MG tablet Take 25 mg by mouth 2 (two) times daily.          No Known Allergies  Past Medical History  Diagnosis Date  . PAD (peripheral artery disease)   . Dyslipidemia   . Carotid bruit   . Tobacco dependence   . Throat cancer     Past Surgical History  Procedure Date  . Aorta - bilateral femoral artery bypass graft   . Tonsillectomy   . Total laryngectomy   . Peg tube placement   . Peg tube removal     History  Smoking status  . Current Everyday Smoker -- 1.0 packs/day  Smokeless tobacco  . Not on file    History  Alcohol Use No    Family History  Problem Relation Age of Onset  . Cancer Mother   . Lung cancer  Brother     Review of Systems: The review of systems is positive for tracheostomy.  All other systems were reviewed and are negative.  Physical Exam: BP 110/60  Ht 5\' 3"  (1.6 m)  Wt 80 lb 1.9 oz (36.342 kg)  BMI 14.19 kg/m2 She is a thin elderly white female in no acute distress. She has a tracheostomy. She is normocephalic, atraumatic. Pupils are equal round and reactive. Oropharynx is clear. She has no JVD. Chest bilateral carotid bruits. Lungs are clear. Cardiac exam reveals a regular rate and rhythm with a grade 1-2/6 systolic ejection murmur at the left sternal border. Abdomen is soft and nontender. She has an old surgical incision the abdomen. Her lower extremities are warm. Pedal pulses are 1+. She has no edema. Her neurologic exam is nonfocal. LABORATORY DATA:   Assessment / Plan:

## 2011-01-15 DIAGNOSIS — C139 Malignant neoplasm of hypopharynx, unspecified: Secondary | ICD-10-CM | POA: Insufficient documentation

## 2011-12-27 DIAGNOSIS — C3490 Malignant neoplasm of unspecified part of unspecified bronchus or lung: Secondary | ICD-10-CM | POA: Insufficient documentation

## 2011-12-27 DIAGNOSIS — C760 Malignant neoplasm of head, face and neck: Secondary | ICD-10-CM | POA: Insufficient documentation

## 2012-01-27 ENCOUNTER — Other Ambulatory Visit: Payer: Self-pay

## 2012-01-27 MED ORDER — EZETIMIBE 10 MG PO TABS: 10.0000 mg | ORAL_TABLET | Freq: Every day | ORAL | Status: DC

## 2014-10-13 DIAGNOSIS — F17290 Nicotine dependence, other tobacco product, uncomplicated: Secondary | ICD-10-CM | POA: Diagnosis present

## 2014-10-13 DIAGNOSIS — Z9119 Patient's noncompliance with other medical treatment and regimen: Secondary | ICD-10-CM | POA: Diagnosis present

## 2014-10-13 DIAGNOSIS — N390 Urinary tract infection, site not specified: Secondary | ICD-10-CM | POA: Diagnosis not present

## 2014-10-13 DIAGNOSIS — Z716 Tobacco abuse counseling: Secondary | ICD-10-CM | POA: Diagnosis present

## 2014-10-13 DIAGNOSIS — I2510 Atherosclerotic heart disease of native coronary artery without angina pectoris: Secondary | ICD-10-CM | POA: Diagnosis present

## 2014-10-13 DIAGNOSIS — E876 Hypokalemia: Secondary | ICD-10-CM | POA: Diagnosis not present

## 2014-10-13 DIAGNOSIS — R29898 Other symptoms and signs involving the musculoskeletal system: Secondary | ICD-10-CM | POA: Diagnosis not present

## 2014-10-13 DIAGNOSIS — Z23 Encounter for immunization: Secondary | ICD-10-CM | POA: Diagnosis not present

## 2014-10-13 DIAGNOSIS — Z951 Presence of aortocoronary bypass graft: Secondary | ICD-10-CM | POA: Diagnosis not present

## 2014-10-13 DIAGNOSIS — M79604 Pain in right leg: Secondary | ICD-10-CM | POA: Diagnosis not present

## 2014-10-13 DIAGNOSIS — M4316 Spondylolisthesis, lumbar region: Secondary | ICD-10-CM | POA: Diagnosis not present

## 2014-10-13 DIAGNOSIS — E46 Unspecified protein-calorie malnutrition: Secondary | ICD-10-CM | POA: Diagnosis present

## 2014-10-13 DIAGNOSIS — F17210 Nicotine dependence, cigarettes, uncomplicated: Secondary | ICD-10-CM | POA: Diagnosis present

## 2014-10-13 DIAGNOSIS — R627 Adult failure to thrive: Secondary | ICD-10-CM | POA: Diagnosis not present

## 2014-10-13 DIAGNOSIS — I739 Peripheral vascular disease, unspecified: Secondary | ICD-10-CM | POA: Diagnosis not present

## 2014-10-17 DIAGNOSIS — E876 Hypokalemia: Secondary | ICD-10-CM | POA: Diagnosis not present

## 2014-10-18 DIAGNOSIS — Z681 Body mass index (BMI) 19 or less, adult: Secondary | ICD-10-CM | POA: Diagnosis not present

## 2014-10-18 DIAGNOSIS — J449 Chronic obstructive pulmonary disease, unspecified: Secondary | ICD-10-CM | POA: Diagnosis not present

## 2014-10-18 DIAGNOSIS — C140 Malignant neoplasm of pharynx, unspecified: Secondary | ICD-10-CM | POA: Diagnosis not present

## 2014-10-18 DIAGNOSIS — I739 Peripheral vascular disease, unspecified: Secondary | ICD-10-CM | POA: Diagnosis not present

## 2014-10-18 DIAGNOSIS — E876 Hypokalemia: Secondary | ICD-10-CM | POA: Diagnosis not present

## 2014-10-18 DIAGNOSIS — G629 Polyneuropathy, unspecified: Secondary | ICD-10-CM | POA: Diagnosis not present

## 2014-10-18 DIAGNOSIS — R636 Underweight: Secondary | ICD-10-CM | POA: Diagnosis not present

## 2014-10-18 DIAGNOSIS — I10 Essential (primary) hypertension: Secondary | ICD-10-CM | POA: Diagnosis not present

## 2014-10-18 DIAGNOSIS — E780 Pure hypercholesterolemia: Secondary | ICD-10-CM | POA: Diagnosis not present

## 2014-10-18 DIAGNOSIS — Z720 Tobacco use: Secondary | ICD-10-CM | POA: Diagnosis not present

## 2014-10-22 DIAGNOSIS — J449 Chronic obstructive pulmonary disease, unspecified: Secondary | ICD-10-CM | POA: Diagnosis not present

## 2014-10-22 DIAGNOSIS — C140 Malignant neoplasm of pharynx, unspecified: Secondary | ICD-10-CM | POA: Diagnosis not present

## 2014-10-22 DIAGNOSIS — E780 Pure hypercholesterolemia: Secondary | ICD-10-CM | POA: Diagnosis not present

## 2014-10-22 DIAGNOSIS — Z720 Tobacco use: Secondary | ICD-10-CM | POA: Diagnosis not present

## 2014-10-22 DIAGNOSIS — R600 Localized edema: Secondary | ICD-10-CM | POA: Diagnosis not present

## 2014-10-22 DIAGNOSIS — I10 Essential (primary) hypertension: Secondary | ICD-10-CM | POA: Diagnosis not present

## 2014-10-22 DIAGNOSIS — Z681 Body mass index (BMI) 19 or less, adult: Secondary | ICD-10-CM | POA: Diagnosis not present

## 2014-10-22 DIAGNOSIS — G629 Polyneuropathy, unspecified: Secondary | ICD-10-CM | POA: Diagnosis not present

## 2014-10-22 DIAGNOSIS — I739 Peripheral vascular disease, unspecified: Secondary | ICD-10-CM | POA: Diagnosis not present

## 2014-10-22 DIAGNOSIS — R636 Underweight: Secondary | ICD-10-CM | POA: Diagnosis not present

## 2014-10-24 DIAGNOSIS — R636 Underweight: Secondary | ICD-10-CM | POA: Diagnosis not present

## 2014-10-24 DIAGNOSIS — J449 Chronic obstructive pulmonary disease, unspecified: Secondary | ICD-10-CM | POA: Diagnosis not present

## 2014-10-24 DIAGNOSIS — I739 Peripheral vascular disease, unspecified: Secondary | ICD-10-CM | POA: Diagnosis not present

## 2014-10-24 DIAGNOSIS — E780 Pure hypercholesterolemia: Secondary | ICD-10-CM | POA: Diagnosis not present

## 2014-10-24 DIAGNOSIS — I10 Essential (primary) hypertension: Secondary | ICD-10-CM | POA: Diagnosis not present

## 2014-10-24 DIAGNOSIS — Z720 Tobacco use: Secondary | ICD-10-CM | POA: Diagnosis not present

## 2014-10-24 DIAGNOSIS — C140 Malignant neoplasm of pharynx, unspecified: Secondary | ICD-10-CM | POA: Diagnosis not present

## 2014-10-24 DIAGNOSIS — E786 Lipoprotein deficiency: Secondary | ICD-10-CM | POA: Diagnosis not present

## 2014-10-24 DIAGNOSIS — G629 Polyneuropathy, unspecified: Secondary | ICD-10-CM | POA: Diagnosis not present

## 2014-10-24 DIAGNOSIS — Z681 Body mass index (BMI) 19 or less, adult: Secondary | ICD-10-CM | POA: Diagnosis not present

## 2014-10-24 DIAGNOSIS — R600 Localized edema: Secondary | ICD-10-CM | POA: Diagnosis not present

## 2014-11-01 DIAGNOSIS — I10 Essential (primary) hypertension: Secondary | ICD-10-CM | POA: Diagnosis not present

## 2014-11-01 DIAGNOSIS — Z1389 Encounter for screening for other disorder: Secondary | ICD-10-CM | POA: Diagnosis not present

## 2014-11-01 DIAGNOSIS — Z720 Tobacco use: Secondary | ICD-10-CM | POA: Diagnosis not present

## 2014-11-01 DIAGNOSIS — G629 Polyneuropathy, unspecified: Secondary | ICD-10-CM | POA: Diagnosis not present

## 2014-11-01 DIAGNOSIS — C140 Malignant neoplasm of pharynx, unspecified: Secondary | ICD-10-CM | POA: Diagnosis not present

## 2014-11-01 DIAGNOSIS — R636 Underweight: Secondary | ICD-10-CM | POA: Diagnosis not present

## 2014-11-01 DIAGNOSIS — E780 Pure hypercholesterolemia: Secondary | ICD-10-CM | POA: Diagnosis not present

## 2014-11-01 DIAGNOSIS — J449 Chronic obstructive pulmonary disease, unspecified: Secondary | ICD-10-CM | POA: Diagnosis not present

## 2014-11-01 DIAGNOSIS — Z9181 History of falling: Secondary | ICD-10-CM | POA: Diagnosis not present

## 2014-11-01 DIAGNOSIS — E876 Hypokalemia: Secondary | ICD-10-CM | POA: Diagnosis not present

## 2014-11-01 DIAGNOSIS — I739 Peripheral vascular disease, unspecified: Secondary | ICD-10-CM | POA: Diagnosis not present

## 2014-11-01 DIAGNOSIS — M159 Polyosteoarthritis, unspecified: Secondary | ICD-10-CM | POA: Diagnosis not present

## 2014-11-02 DIAGNOSIS — I10 Essential (primary) hypertension: Secondary | ICD-10-CM | POA: Diagnosis not present

## 2014-11-02 DIAGNOSIS — L03119 Cellulitis of unspecified part of limb: Secondary | ICD-10-CM | POA: Diagnosis not present

## 2014-11-02 DIAGNOSIS — J449 Chronic obstructive pulmonary disease, unspecified: Secondary | ICD-10-CM | POA: Diagnosis not present

## 2014-11-02 DIAGNOSIS — M159 Polyosteoarthritis, unspecified: Secondary | ICD-10-CM | POA: Diagnosis not present

## 2014-11-02 DIAGNOSIS — E780 Pure hypercholesterolemia: Secondary | ICD-10-CM | POA: Diagnosis not present

## 2014-11-02 DIAGNOSIS — E876 Hypokalemia: Secondary | ICD-10-CM | POA: Diagnosis not present

## 2014-11-02 DIAGNOSIS — C140 Malignant neoplasm of pharynx, unspecified: Secondary | ICD-10-CM | POA: Diagnosis not present

## 2014-11-02 DIAGNOSIS — R636 Underweight: Secondary | ICD-10-CM | POA: Diagnosis not present

## 2014-11-02 DIAGNOSIS — I739 Peripheral vascular disease, unspecified: Secondary | ICD-10-CM | POA: Diagnosis not present

## 2014-11-02 DIAGNOSIS — Z720 Tobacco use: Secondary | ICD-10-CM | POA: Diagnosis not present

## 2014-11-02 DIAGNOSIS — G629 Polyneuropathy, unspecified: Secondary | ICD-10-CM | POA: Diagnosis not present

## 2014-11-02 DIAGNOSIS — R600 Localized edema: Secondary | ICD-10-CM | POA: Diagnosis not present

## 2014-11-06 DIAGNOSIS — I10 Essential (primary) hypertension: Secondary | ICD-10-CM | POA: Diagnosis not present

## 2014-11-06 DIAGNOSIS — M159 Polyosteoarthritis, unspecified: Secondary | ICD-10-CM | POA: Diagnosis not present

## 2014-11-06 DIAGNOSIS — E780 Pure hypercholesterolemia: Secondary | ICD-10-CM | POA: Diagnosis not present

## 2014-11-06 DIAGNOSIS — J449 Chronic obstructive pulmonary disease, unspecified: Secondary | ICD-10-CM | POA: Diagnosis not present

## 2014-11-06 DIAGNOSIS — L03119 Cellulitis of unspecified part of limb: Secondary | ICD-10-CM | POA: Diagnosis not present

## 2014-11-06 DIAGNOSIS — E876 Hypokalemia: Secondary | ICD-10-CM | POA: Diagnosis not present

## 2014-11-06 DIAGNOSIS — G629 Polyneuropathy, unspecified: Secondary | ICD-10-CM | POA: Diagnosis not present

## 2014-11-06 DIAGNOSIS — R636 Underweight: Secondary | ICD-10-CM | POA: Diagnosis not present

## 2014-11-06 DIAGNOSIS — C140 Malignant neoplasm of pharynx, unspecified: Secondary | ICD-10-CM | POA: Diagnosis not present

## 2014-11-06 DIAGNOSIS — R600 Localized edema: Secondary | ICD-10-CM | POA: Diagnosis not present

## 2014-11-06 DIAGNOSIS — I739 Peripheral vascular disease, unspecified: Secondary | ICD-10-CM | POA: Diagnosis not present

## 2014-11-06 DIAGNOSIS — Z720 Tobacco use: Secondary | ICD-10-CM | POA: Diagnosis not present

## 2014-11-12 DIAGNOSIS — L03119 Cellulitis of unspecified part of limb: Secondary | ICD-10-CM | POA: Diagnosis not present

## 2014-11-12 DIAGNOSIS — E780 Pure hypercholesterolemia: Secondary | ICD-10-CM | POA: Diagnosis not present

## 2014-11-12 DIAGNOSIS — G629 Polyneuropathy, unspecified: Secondary | ICD-10-CM | POA: Diagnosis not present

## 2014-11-12 DIAGNOSIS — I739 Peripheral vascular disease, unspecified: Secondary | ICD-10-CM | POA: Diagnosis not present

## 2014-11-12 DIAGNOSIS — R600 Localized edema: Secondary | ICD-10-CM | POA: Diagnosis not present

## 2014-11-12 DIAGNOSIS — M159 Polyosteoarthritis, unspecified: Secondary | ICD-10-CM | POA: Diagnosis not present

## 2014-11-12 DIAGNOSIS — C140 Malignant neoplasm of pharynx, unspecified: Secondary | ICD-10-CM | POA: Diagnosis not present

## 2014-11-12 DIAGNOSIS — Z720 Tobacco use: Secondary | ICD-10-CM | POA: Diagnosis not present

## 2014-11-12 DIAGNOSIS — I10 Essential (primary) hypertension: Secondary | ICD-10-CM | POA: Diagnosis not present

## 2014-11-12 DIAGNOSIS — J449 Chronic obstructive pulmonary disease, unspecified: Secondary | ICD-10-CM | POA: Diagnosis not present

## 2014-11-12 DIAGNOSIS — R636 Underweight: Secondary | ICD-10-CM | POA: Diagnosis not present

## 2014-11-12 DIAGNOSIS — E876 Hypokalemia: Secondary | ICD-10-CM | POA: Diagnosis not present

## 2014-11-20 DIAGNOSIS — R636 Underweight: Secondary | ICD-10-CM | POA: Diagnosis not present

## 2014-11-20 DIAGNOSIS — R600 Localized edema: Secondary | ICD-10-CM | POA: Diagnosis not present

## 2014-11-20 DIAGNOSIS — Z720 Tobacco use: Secondary | ICD-10-CM | POA: Diagnosis not present

## 2014-11-20 DIAGNOSIS — I10 Essential (primary) hypertension: Secondary | ICD-10-CM | POA: Diagnosis not present

## 2014-11-20 DIAGNOSIS — G629 Polyneuropathy, unspecified: Secondary | ICD-10-CM | POA: Diagnosis not present

## 2014-11-20 DIAGNOSIS — E876 Hypokalemia: Secondary | ICD-10-CM | POA: Diagnosis not present

## 2014-11-20 DIAGNOSIS — J449 Chronic obstructive pulmonary disease, unspecified: Secondary | ICD-10-CM | POA: Diagnosis not present

## 2014-11-20 DIAGNOSIS — Z681 Body mass index (BMI) 19 or less, adult: Secondary | ICD-10-CM | POA: Diagnosis not present

## 2014-11-20 DIAGNOSIS — M159 Polyosteoarthritis, unspecified: Secondary | ICD-10-CM | POA: Diagnosis not present

## 2014-11-20 DIAGNOSIS — E780 Pure hypercholesterolemia: Secondary | ICD-10-CM | POA: Diagnosis not present

## 2014-11-20 DIAGNOSIS — C140 Malignant neoplasm of pharynx, unspecified: Secondary | ICD-10-CM | POA: Diagnosis not present

## 2014-11-20 DIAGNOSIS — I739 Peripheral vascular disease, unspecified: Secondary | ICD-10-CM | POA: Diagnosis not present

## 2014-11-23 ENCOUNTER — Other Ambulatory Visit: Payer: Self-pay | Admitting: *Deleted

## 2014-11-23 DIAGNOSIS — I739 Peripheral vascular disease, unspecified: Secondary | ICD-10-CM

## 2014-11-26 DIAGNOSIS — J449 Chronic obstructive pulmonary disease, unspecified: Secondary | ICD-10-CM | POA: Diagnosis not present

## 2014-11-26 DIAGNOSIS — I10 Essential (primary) hypertension: Secondary | ICD-10-CM | POA: Diagnosis not present

## 2014-11-26 DIAGNOSIS — G629 Polyneuropathy, unspecified: Secondary | ICD-10-CM | POA: Diagnosis not present

## 2014-11-26 DIAGNOSIS — C140 Malignant neoplasm of pharynx, unspecified: Secondary | ICD-10-CM | POA: Diagnosis not present

## 2014-11-26 DIAGNOSIS — Z720 Tobacco use: Secondary | ICD-10-CM | POA: Diagnosis not present

## 2014-11-26 DIAGNOSIS — R636 Underweight: Secondary | ICD-10-CM | POA: Diagnosis not present

## 2014-11-26 DIAGNOSIS — L0390 Cellulitis, unspecified: Secondary | ICD-10-CM | POA: Diagnosis not present

## 2014-11-26 DIAGNOSIS — I739 Peripheral vascular disease, unspecified: Secondary | ICD-10-CM | POA: Diagnosis not present

## 2014-11-26 DIAGNOSIS — E876 Hypokalemia: Secondary | ICD-10-CM | POA: Diagnosis not present

## 2014-11-26 DIAGNOSIS — Z681 Body mass index (BMI) 19 or less, adult: Secondary | ICD-10-CM | POA: Diagnosis not present

## 2014-11-26 DIAGNOSIS — R600 Localized edema: Secondary | ICD-10-CM | POA: Diagnosis not present

## 2014-11-26 DIAGNOSIS — E780 Pure hypercholesterolemia: Secondary | ICD-10-CM | POA: Diagnosis not present

## 2014-12-04 DIAGNOSIS — L0390 Cellulitis, unspecified: Secondary | ICD-10-CM | POA: Diagnosis not present

## 2014-12-04 DIAGNOSIS — E876 Hypokalemia: Secondary | ICD-10-CM | POA: Diagnosis not present

## 2014-12-04 DIAGNOSIS — R636 Underweight: Secondary | ICD-10-CM | POA: Diagnosis not present

## 2014-12-04 DIAGNOSIS — Z720 Tobacco use: Secondary | ICD-10-CM | POA: Diagnosis not present

## 2014-12-04 DIAGNOSIS — C140 Malignant neoplasm of pharynx, unspecified: Secondary | ICD-10-CM | POA: Diagnosis not present

## 2014-12-04 DIAGNOSIS — R600 Localized edema: Secondary | ICD-10-CM | POA: Diagnosis not present

## 2014-12-04 DIAGNOSIS — J449 Chronic obstructive pulmonary disease, unspecified: Secondary | ICD-10-CM | POA: Diagnosis not present

## 2014-12-04 DIAGNOSIS — G629 Polyneuropathy, unspecified: Secondary | ICD-10-CM | POA: Diagnosis not present

## 2014-12-04 DIAGNOSIS — M159 Polyosteoarthritis, unspecified: Secondary | ICD-10-CM | POA: Diagnosis not present

## 2014-12-04 DIAGNOSIS — I739 Peripheral vascular disease, unspecified: Secondary | ICD-10-CM | POA: Diagnosis not present

## 2014-12-04 DIAGNOSIS — E780 Pure hypercholesterolemia: Secondary | ICD-10-CM | POA: Diagnosis not present

## 2014-12-04 DIAGNOSIS — I10 Essential (primary) hypertension: Secondary | ICD-10-CM | POA: Diagnosis not present

## 2014-12-10 DIAGNOSIS — R600 Localized edema: Secondary | ICD-10-CM | POA: Diagnosis not present

## 2014-12-10 DIAGNOSIS — Z720 Tobacco use: Secondary | ICD-10-CM | POA: Diagnosis not present

## 2014-12-10 DIAGNOSIS — L0390 Cellulitis, unspecified: Secondary | ICD-10-CM | POA: Diagnosis not present

## 2014-12-10 DIAGNOSIS — J449 Chronic obstructive pulmonary disease, unspecified: Secondary | ICD-10-CM | POA: Diagnosis not present

## 2014-12-10 DIAGNOSIS — G629 Polyneuropathy, unspecified: Secondary | ICD-10-CM | POA: Diagnosis not present

## 2014-12-10 DIAGNOSIS — M159 Polyosteoarthritis, unspecified: Secondary | ICD-10-CM | POA: Diagnosis not present

## 2014-12-10 DIAGNOSIS — E780 Pure hypercholesterolemia: Secondary | ICD-10-CM | POA: Diagnosis not present

## 2014-12-10 DIAGNOSIS — R636 Underweight: Secondary | ICD-10-CM | POA: Diagnosis not present

## 2014-12-10 DIAGNOSIS — C140 Malignant neoplasm of pharynx, unspecified: Secondary | ICD-10-CM | POA: Diagnosis not present

## 2014-12-10 DIAGNOSIS — E876 Hypokalemia: Secondary | ICD-10-CM | POA: Diagnosis not present

## 2014-12-10 DIAGNOSIS — I739 Peripheral vascular disease, unspecified: Secondary | ICD-10-CM | POA: Diagnosis not present

## 2014-12-10 DIAGNOSIS — I10 Essential (primary) hypertension: Secondary | ICD-10-CM | POA: Diagnosis not present

## 2014-12-11 ENCOUNTER — Encounter: Payer: Self-pay | Admitting: Vascular Surgery

## 2014-12-13 ENCOUNTER — Ambulatory Visit (INDEPENDENT_AMBULATORY_CARE_PROVIDER_SITE_OTHER): Payer: Medicare Other | Admitting: Vascular Surgery

## 2014-12-13 ENCOUNTER — Ambulatory Visit (HOSPITAL_COMMUNITY)
Admission: RE | Admit: 2014-12-13 | Discharge: 2014-12-13 | Disposition: A | Discharge status: Home / Self Care | Payer: Medicare Other | Source: Ambulatory Visit | Attending: Vascular Surgery | Admitting: Vascular Surgery

## 2014-12-13 ENCOUNTER — Encounter: Payer: Self-pay | Admitting: Vascular Surgery

## 2014-12-13 ENCOUNTER — Other Ambulatory Visit: Payer: Self-pay

## 2014-12-13 VITALS — BP 149/101 | HR 90 | Temp 98.3°F | Ht 63.0 in | Wt 88.0 lb

## 2014-12-13 DIAGNOSIS — I739 Peripheral vascular disease, unspecified: Secondary | ICD-10-CM | POA: Diagnosis not present

## 2014-12-13 NOTE — Progress Notes (Signed)
Referred by:  Cher Nakai, MD [address], [place], [place] [address]  Reason for referral: PVD  History of Present Illness  Heather Jimenez is a 69 y.o. (06/29/1945) female who presents for evaluation of her left foot wound. She is known to Dr. Oneida Alar from having undergone prior aortobifemoral bypass and right femoral popliteal bypass in 2010. She has not been seen in the office since 2011. She is no longer able to speak following laryngectomy for throat cancer. She is able to mouth words and her history is aided by her husband. The wound on her left foot has been present for at least a month. She had a ultrasound She does not ambulate much and uses a wheelchair. She continues to smoke.   The patient denies any pulmonary or cardiac issues. She has a history of dyslipidemia that is managed on a statin. She is not on any anticoagulation.   Past Medical History  Diagnosis Date  . PAD (peripheral artery disease)   . Dyslipidemia   . Carotid bruit   . Tobacco dependence   . Throat cancer     Past Surgical History  Procedure Laterality Date  . Aorta - bilateral femoral artery bypass graft    . Tonsillectomy    . Total laryngectomy    . Peg tube placement    . Peg tube removal      Social History   Social History  . Marital Status: Married    Spouse Name: N/A  . Number of Children: N/A  . Years of Education: N/A   Occupational History  . Not on file.   Social History Main Topics  . Smoking status: Current Every Day Smoker -- 1.00 packs/day  . Smokeless tobacco: Not on file  . Alcohol Use: No  . Drug Use: No  . Sexual Activity: Not on file   Other Topics Concern  . Not on file   Social History Narrative    Family History  Problem Relation Age of Onset  . Cancer Mother   . Lung cancer Brother      Current Outpatient Prescriptions on File Prior to Visit  Medication Sig Dispense Refill  . ACETAMINOPHEN PO Take by mouth as needed.      . gabapentin  (NEURONTIN) 300 MG capsule Take 300 mg by mouth daily.      . metoprolol tartrate (LOPRESSOR) 25 MG tablet Take 25 mg by mouth 2 (two) times daily.      Marland Kitchen atorvastatin (LIPITOR) 80 MG tablet Take 80 mg by mouth daily.      Marland Kitchen ezetimibe (ZETIA) 10 MG tablet Take 1 tablet (10 mg total) by mouth daily. (Patient not taking: Reported on 12/13/2014) 30 tablet 0   No current facility-administered medications on file prior to visit.    No Known Allergies    REVIEW OF SYSTEMS:  (Positives checked otherwise negative)  CARDIOVASCULAR:  '[]'$  chest pain, '[]'$  chest pressure, '[]'$  palpitations, '[]'$  shortness of breath when laying flat, '[]'$  shortness of breath with exertion,  '[]'$  pain in feet when walking, '[x]'$  pain in feet when laying flat, '[]'$  history of blood clot in veins (DVT), '[]'$  history of phlebitis, '[]'$  swelling in legs, '[]'$  varicose veins  PULMONARY:  '[]'$  productive cough, '[]'$  asthma, '[]'$  wheezing  NEUROLOGIC:  '[]'$  weakness in arms or legs, '[]'$  numbness in arms or legs, '[]'$  difficulty speaking or slurred speech, '[]'$  temporary loss of vision in one eye, '[]'$  dizziness  HEMATOLOGIC:  '[]'$  bleeding  problems, '[]'$  problems with blood clotting too easily  MUSCULOSKEL:  '[]'$  joint pain, '[]'$  joint swelling  GASTROINTEST:  '[]'$  vomiting blood, '[]'$  blood in stool     GENITOURINARY:  '[]'$  burning with urination, '[]'$  blood in urine  PSYCHIATRIC:  '[]'$  history of major depression  INTEGUMENTARY:  '[]'$  rashes, '[]'$  ulcers  CONSTITUTIONAL:  '[]'$  fever, '[]'$  chills  For VQI Use Only  PRE-ADM LIVING: Home  AMB STATUS: Wheelchair  Physical Examination  Filed Vitals:   12/13/14 1459 12/13/14 1506  BP: 179/113 149/101  Pulse: 90   Temp: 98.3 F (36.8 C)   TempSrc: Oral   Height: '5\' 3"'$  (1.6 m)   Weight: 88 lb (39.917 kg)   SpO2: 97%    Body mass index is 15.59 kg/(m^2).  General: A&O x 3, thin, frail appearing female  Pulmonary: Non labored breathing  Vascular: Nonpalpable right femoral pulse. Palpable left femoral pulse. Non  palpable pedal pulses. Large full thickness ulceration to dorsum of right foot. Ulceration without drainage.   Neurologic: No focal deficits  Musculoskeletal: Diffuse muscle atrophy   Psychiatric: Judgment intact, Mood & affect appropriate for pt's clinical situation  Non-Invasive Vascular Imaging  ABI (Date: 12/13/2014)  Unable to be performed due to patient intolerance of test  Outside Studies/Documentation 4 pages of outside documents were reviewed including: medical records from PCP.  Medical Decision Making  Heather Jimenez is a 69 y.o. female who presents with large ulceration of right foot s/p aortobifemoral bypass and right femoral-popliteal bypass in 2010.   Based on physical exam, the right limb of her aortobifemoral bypass is occluded as well as her right femoral-popliteal bypass. She is at very high risk for amputation of her right lower extremity. Plan for aortogram with lower extremity runoff tomorrow to evaluate for possible revascularization options with Dr. Oneida Alar.   Virgina Jock, PA-C [facility] Office: [phone] Pager: [phone]  12/13/2014, 3:31 PM  This patient was seen in conjunction with Dr. Oneida Alar    History and exam details as above. Patient has a large wound on her right foot it is 7 x 7 cm and is full-thickness loss of skin. Most likely this foot is not going to be salvageable. However I believe she should at least have an arteriogram to see whether or not she has any revascularization options. Most likely the right limb of her aortobifemoral bypass graft has occluded which has also occluded her right femoropopliteal bypass. She may not have enough flow to heal even out above-knee amputation at this point. I would consider doing a femoral femoral bypass if she has a reasonable target. However, given her current frail state she may only be a candidate for amputation.  I have scheduled her for aortogram and lower  extremity runoff tomorrow and we will have further consideration at that point whether or not she would need revascularization versus primary amputation.  Risks benefits possible complications and procedure details were discussed patient and her husband today. Understand and agree to proceed.  [name], MD [facility] Office: [phone] Pager: [phone]

## 2014-12-14 ENCOUNTER — Encounter (HOSPITAL_COMMUNITY): Payer: Self-pay | Admitting: Vascular Surgery

## 2014-12-14 ENCOUNTER — Ambulatory Visit (HOSPITAL_COMMUNITY)
Admission: RE | Admit: 2014-12-14 | Discharge: 2014-12-14 | Disposition: A | Discharge status: Home / Self Care | Payer: Medicare Other | Source: Ambulatory Visit | Attending: Vascular Surgery | Admitting: Vascular Surgery

## 2014-12-14 ENCOUNTER — Other Ambulatory Visit: Payer: Self-pay

## 2014-12-14 ENCOUNTER — Encounter (HOSPITAL_COMMUNITY)
Admission: RE | Disposition: A | Discharge status: Home / Self Care | Payer: Medicare Other | Source: Ambulatory Visit | Attending: Vascular Surgery

## 2014-12-14 DIAGNOSIS — L97519 Non-pressure chronic ulcer of other part of right foot with unspecified severity: Secondary | ICD-10-CM | POA: Insufficient documentation

## 2014-12-14 DIAGNOSIS — I779 Disorder of arteries and arterioles, unspecified: Secondary | ICD-10-CM | POA: Diagnosis not present

## 2014-12-14 DIAGNOSIS — S91302A Unspecified open wound, left foot, initial encounter: Secondary | ICD-10-CM | POA: Diagnosis not present

## 2014-12-14 DIAGNOSIS — Z79899 Other long term (current) drug therapy: Secondary | ICD-10-CM | POA: Diagnosis not present

## 2014-12-14 DIAGNOSIS — Y832 Surgical operation with anastomosis, bypass or graft as the cause of abnormal reaction of the patient, or of later complication, without mention of misadventure at the time of the procedure: Secondary | ICD-10-CM | POA: Insufficient documentation

## 2014-12-14 DIAGNOSIS — T82898A Other specified complication of vascular prosthetic devices, implants and grafts, initial encounter: Secondary | ICD-10-CM | POA: Insufficient documentation

## 2014-12-14 DIAGNOSIS — Z85819 Personal history of malignant neoplasm of unspecified site of lip, oral cavity, and pharynx: Secondary | ICD-10-CM | POA: Diagnosis not present

## 2014-12-14 DIAGNOSIS — I70435 Atherosclerosis of autologous vein bypass graft(s) of the right leg with ulceration of other part of foot: Secondary | ICD-10-CM | POA: Diagnosis not present

## 2014-12-14 DIAGNOSIS — M2450 Contracture, unspecified joint: Secondary | ICD-10-CM | POA: Insufficient documentation

## 2014-12-14 DIAGNOSIS — I739 Peripheral vascular disease, unspecified: Secondary | ICD-10-CM | POA: Insufficient documentation

## 2014-12-14 DIAGNOSIS — E785 Hyperlipidemia, unspecified: Secondary | ICD-10-CM | POA: Diagnosis not present

## 2014-12-14 DIAGNOSIS — F17210 Nicotine dependence, cigarettes, uncomplicated: Secondary | ICD-10-CM | POA: Insufficient documentation

## 2014-12-14 HISTORY — PX: LOWER EXTREMITY ANGIOGRAM: SHX5508

## 2014-12-14 HISTORY — PX: PERIPHERAL VASCULAR CATHETERIZATION: SHX172C

## 2014-12-14 LAB — COMPREHENSIVE METABOLIC PANEL
ALBUMIN: 3.1 g/dL — AB (ref 3.5–5.0)
ALK PHOS: 102 U/L (ref 38–126)
ALT: 8 U/L — ABNORMAL LOW (ref 14–54)
ANION GAP: 11 (ref 5–15)
AST: 25 U/L (ref 15–41)
BUN: 6 mg/dL (ref 6–20)
CALCIUM: 8.9 mg/dL (ref 8.9–10.3)
CHLORIDE: 100 mmol/L — AB (ref 101–111)
CO2: 24 mmol/L (ref 22–32)
Creatinine, Ser: 0.64 mg/dL (ref 0.44–1.00)
GFR calc non Af Amer: >60 mL/min (ref >60)
GLUCOSE: 224 mg/dL — AB (ref 65–99)
POTASSIUM: 3.7 mmol/L (ref 3.5–5.1)
SODIUM: 135 mmol/L (ref 135–145)
Total Bilirubin: 0.4 mg/dL (ref 0.3–1.2)
Total Protein: 6.6 g/dL (ref 6.5–8.1)

## 2014-12-14 LAB — POCT I-STAT, CHEM 8
BUN: 9 mg/dL (ref 6–20)
CALCIUM ION: 1.21 mmol/L (ref 1.13–1.30)
CREATININE: 0.7 mg/dL (ref 0.44–1.00)
Chloride: 98 mmol/L — ABNORMAL LOW (ref 101–111)
Glucose, Bld: 132 mg/dL — ABNORMAL HIGH (ref 65–99)
HEMATOCRIT: 43 % (ref 36.0–46.0)
HEMOGLOBIN: 14.6 g/dL (ref 12.0–15.0)
Potassium: 4.2 mmol/L (ref 3.5–5.1)
SODIUM: 136 mmol/L (ref 135–145)
TCO2: 30 mmol/L (ref 0–100)

## 2014-12-14 LAB — CBC
HCT: 37.1 % (ref 36.0–46.0)
Hemoglobin: 11.3 g/dL — ABNORMAL LOW (ref 12.0–15.0)
MCH: 27.5 pg (ref 26.0–34.0)
MCHC: 30.5 g/dL (ref 30.0–36.0)
MCV: 90.3 fL (ref 78.0–100.0)
PLATELETS: 360 10*3/uL (ref 150–400)
RBC: 4.11 MIL/uL (ref 3.87–5.11)
RDW: 17.6 % — AB (ref 11.5–15.5)
WBC: 7 10*3/uL (ref 4.0–10.5)

## 2014-12-14 SURGERY — ABDOMINAL AORTOGRAM

## 2014-12-14 MED ORDER — HYDRALAZINE HCL 20 MG/ML IJ SOLN: 5.0000 mg | INTRAMUSCULAR | Status: DC | PRN

## 2014-12-14 MED ORDER — OXYCODONE HCL 5 MG PO TABS: 5.0000 mg | ORAL_TABLET | ORAL | Status: DC | PRN

## 2014-12-14 MED ORDER — MORPHINE SULFATE (PF) 2 MG/ML IV SOLN
INTRAVENOUS | Status: AC
Start: 2014-12-14 — End: 2014-12-14
  Filled 2014-12-14: qty 1

## 2014-12-14 MED ORDER — SODIUM CHLORIDE 0.9 % IV SOLN
INTRAVENOUS | Status: DC
  Administered 2014-12-14: 08:00:00 via INTRAVENOUS

## 2014-12-14 MED ORDER — LIDOCAINE HCL (PF) 1 % IJ SOLN
INTRAMUSCULAR | Status: AC
  Filled 2014-12-14: qty 30

## 2014-12-14 MED ORDER — LABETALOL HCL 5 MG/ML IV SOLN: 10.0000 mg | INTRAVENOUS | Status: DC | PRN

## 2014-12-14 MED ORDER — LABETALOL HCL 5 MG/ML IV SOLN
INTRAVENOUS | Status: AC
  Filled 2014-12-14: qty 4

## 2014-12-14 MED ORDER — METOPROLOL TARTRATE 1 MG/ML IV SOLN: 2.0000 mg | INTRAVENOUS | Status: DC | PRN

## 2014-12-14 MED ORDER — SODIUM CHLORIDE 0.45 % IV SOLN
INTRAVENOUS | Status: DC
  Administered 2014-12-14: 500 mL via INTRAVENOUS

## 2014-12-14 MED ORDER — ONDANSETRON HCL 4 MG/2ML IJ SOLN: 4.0000 mg | Freq: Four times a day (QID) | INTRAMUSCULAR | Status: DC | PRN

## 2014-12-14 MED ORDER — MORPHINE SULFATE (PF) 2 MG/ML IV SOLN
INTRAVENOUS | Status: AC
  Administered 2014-12-14: 2 mg via INTRAVENOUS
  Filled 2014-12-14: qty 1

## 2014-12-14 MED ORDER — ACETAMINOPHEN 325 MG RE SUPP: 325.0000 mg | RECTAL | Status: DC | PRN

## 2014-12-14 MED ORDER — MORPHINE SULFATE (PF) 10 MG/ML IV SOLN
2.0000 mg | Freq: Once | INTRAVENOUS | Status: AC
  Administered 2014-12-14: 2 mg via INTRAVENOUS

## 2014-12-14 MED ORDER — MORPHINE SULFATE (PF) 2 MG/ML IV SOLN
INTRAVENOUS | Status: AC
  Filled 2014-12-14: qty 1

## 2014-12-14 MED ORDER — ACETAMINOPHEN 325 MG PO TABS: 325.0000 mg | ORAL_TABLET | ORAL | Status: DC | PRN

## 2014-12-14 MED ORDER — MORPHINE SULFATE (PF) 10 MG/ML IV SOLN
2.0000 mg | INTRAVENOUS | Status: DC | PRN
  Administered 2014-12-14: 2 mg via INTRAVENOUS

## 2014-12-14 MED ORDER — LIDOCAINE HCL (PF) 1 % IJ SOLN
INTRAMUSCULAR | Status: DC | PRN
  Administered 2014-12-14: 15 mL

## 2014-12-14 MED ORDER — HEPARIN (PORCINE) IN NACL 2-0.9 UNIT/ML-% IJ SOLN
INTRAMUSCULAR | Status: AC
  Filled 2014-12-14: qty 1000

## 2014-12-14 MED ORDER — IODIXANOL 320 MG/ML IV SOLN
INTRAVENOUS | Status: DC | PRN
  Administered 2014-12-14: 200 mL via INTRAVENOUS

## 2014-12-14 MED ORDER — LABETALOL HCL 5 MG/ML IV SOLN
INTRAVENOUS | Status: DC | PRN
  Administered 2014-12-14 (×2): 10 mg via INTRAVENOUS

## 2014-12-14 SURGICAL SUPPLY — 7 items
CATH ANGIO 5F PIGTAIL 65CM (CATHETERS) ×3 IMPLANT
KIT PV (KITS) ×3 IMPLANT
SHEATH PINNACLE 5F 10CM (SHEATH) ×3 IMPLANT
SYR MEDRAD MARK V 150ML (SYRINGE) ×3 IMPLANT
TRANSDUCER W/STOPCOCK (MISCELLANEOUS) ×3 IMPLANT
TRAY PV CATH (CUSTOM PROCEDURE TRAY) ×3 IMPLANT
WIRE HITORQ VERSACORE ST 145CM (WIRE) ×3 IMPLANT

## 2014-12-14 NOTE — Discharge Instructions (Signed)

## 2014-12-14 NOTE — H&P (View-Only) (Signed)
Referred by:  Heather Nakai, MD [place], Heather Jimenez, [place] [id number]  Reason for referral: PVD  History of Present Illness  Heather Jimenez is a 69 y.o. (November 02, 1945) female who presents for evaluation of her left foot wound. She is known to Heather Jimenez from having undergone prior aortobifemoral bypass and right femoral popliteal bypass in 2010. She has not been seen in the office since 2011. She is no longer able to speak following laryngectomy for throat cancer. She is able to mouth words and her history is aided by her husband. The wound on her left foot has been present for at least a month. She had a ultrasound She does not ambulate much and uses a wheelchair. She continues to smoke.   The patient denies any pulmonary or cardiac issues. She has a history of dyslipidemia that is managed on a statin. She is not on any anticoagulation.   Past Medical History  Diagnosis Date  . PAD (peripheral artery disease)   . Dyslipidemia   . Carotid bruit   . Tobacco dependence   . Throat cancer     Past Surgical History  Procedure Laterality Date  . Aorta - bilateral femoral artery bypass graft    . Tonsillectomy    . Total laryngectomy    . Peg tube placement    . Peg tube removal      Social History   Social History  . Marital Status: Married    Spouse Name: N/A  . Number of Children: N/A  . Years of Education: N/A   Occupational History  . Not on file.   Social History Main Topics  . Smoking status: Current Every Day Smoker -- 1.00 packs/day  . Smokeless tobacco: Not on file  . Alcohol Use: No  . Drug Use: No  . Sexual Activity: Not on file   Other Topics Concern  . Not on file   Social History Narrative    Family History  Problem Relation Age of Onset  . Cancer Mother   . Lung cancer Brother      Current Outpatient Prescriptions on File Prior to Visit  Medication Sig Dispense Refill  . ACETAMINOPHEN PO Take by mouth as needed.      . gabapentin  (NEURONTIN) 300 MG capsule Take 300 mg by mouth daily.      . metoprolol tartrate (LOPRESSOR) 25 MG tablet Take 25 mg by mouth 2 (two) times daily.      Marland Kitchen atorvastatin (LIPITOR) 80 MG tablet Take 80 mg by mouth daily.      Marland Kitchen ezetimibe (ZETIA) 10 MG tablet Take 1 tablet (10 mg total) by mouth daily. (Patient not taking: Reported on 12/13/2014) 30 tablet 0   No current facility-administered medications on file prior to visit.    No Known Allergies    REVIEW OF SYSTEMS:  (Positives checked otherwise negative)  CARDIOVASCULAR:  '[]'$  chest pain, '[]'$  chest pressure, '[]'$  palpitations, '[]'$  shortness of breath when laying flat, '[]'$  shortness of breath with exertion,  '[]'$  pain in feet when walking, '[x]'$  pain in feet when laying flat, '[]'$  history of blood clot in veins (DVT), '[]'$  history of phlebitis, '[]'$  swelling in legs, '[]'$  varicose veins  PULMONARY:  '[]'$  productive cough, '[]'$  asthma, '[]'$  wheezing  NEUROLOGIC:  '[]'$  weakness in arms or legs, '[]'$  numbness in arms or legs, '[]'$  difficulty speaking or slurred speech, '[]'$  temporary loss of vision in one eye, '[]'$  dizziness  HEMATOLOGIC:  '[]'$  bleeding  problems, '[]'$  problems with blood clotting too easily  MUSCULOSKEL:  '[]'$  joint pain, '[]'$  joint swelling  GASTROINTEST:  '[]'$  vomiting blood, '[]'$  blood in stool     GENITOURINARY:  '[]'$  burning with urination, '[]'$  blood in urine  PSYCHIATRIC:  '[]'$  history of major depression  INTEGUMENTARY:  '[]'$  rashes, '[]'$  ulcers  CONSTITUTIONAL:  '[]'$  fever, '[]'$  chills  For VQI Use Only  PRE-ADM LIVING: Home  AMB STATUS: Wheelchair  Physical Examination  Filed Vitals:   12/13/14 1459 12/13/14 1506  BP: 179/113 149/101  Pulse: 90   Temp: 98.3 F (36.8 C)   TempSrc: Oral   Height: '5\' 3"'$  (1.6 m)   Weight: 88 lb (39.917 kg)   SpO2: 97%    Body mass index is 15.59 kg/(m^2).  General: A&O x 3, thin, frail appearing female  Pulmonary: Non labored breathing  Vascular: Nonpalpable right femoral pulse. Palpable left femoral pulse. Non  palpable pedal pulses. Large full thickness ulceration to dorsum of right foot. Ulceration without drainage.   Neurologic: No focal deficits  Musculoskeletal: Diffuse muscle atrophy   Psychiatric: Judgment intact, Mood & affect appropriate for pt's clinical situation  Non-Invasive Vascular Imaging  ABI (Date: 12/13/2014)  Unable to be performed due to patient intolerance of test  Outside Studies/Documentation 4 pages of outside documents were reviewed including: medical records from PCP.  Medical Decision Making  Heather Jimenez is a 69 y.o. female who presents with large ulceration of right foot s/p aortobifemoral bypass and right femoral-popliteal bypass in 2010.   Based on physical exam, the right limb of her aortobifemoral bypass is occluded as well as her right femoral-popliteal bypass. She is at very high risk for amputation of her right lower extremity. Plan for aortogram with lower extremity runoff tomorrow to evaluate for possible revascularization options with Heather Jimenez.   Heather Jock, PA-C [facility] Office: [phone] Pager: [phone]  12/13/2014, 3:31 PM  This patient was seen in conjunction with Heather Jimenez    History and exam details as above. Patient has a large wound on her right foot it is 7 x 7 cm and is full-thickness loss of skin. Most likely this foot is not going to be salvageable. However I believe she should at least have an arteriogram to see whether or not she has any revascularization options. Most likely the right limb of her aortobifemoral bypass graft has occluded which has also occluded her right femoropopliteal bypass. She may not have enough flow to heal even out above-knee amputation at this point. I would consider doing a femoral femoral bypass if she has a reasonable target. However, given her current frail state she may only be a candidate for amputation.  I have scheduled her for aortogram and lower  extremity runoff tomorrow and we will have further consideration at that point whether or not she would need revascularization versus primary amputation.  Risks benefits possible complications and procedure details were discussed patient and her husband today. Understand and agree to proceed.  [name], MD [facility] Office: [phone] Pager: [phone]

## 2014-12-14 NOTE — Progress Notes (Signed)
Husband in room visiting.

## 2014-12-14 NOTE — Progress Notes (Addendum)
Site area: Left FA Site Prior to Removal:  Level 0 Pressure Applied For:9mn Manual:   yes Patient Status During Pull: stable  Post Pull Site:  Level 0 Post Pull Instructions Given:  yes Post Pull Pulses Present: doppler Dressing Applied:  clear Bedrest begins @ 1100 till 1600 Comments:

## 2014-12-14 NOTE — Interval H&P Note (Signed)
History and Physical Interval Note:  12/14/2014 9:04 AM  Heather Jimenez  has presented today for surgery, with the diagnosis of pvd right foot ulcer  The various methods of treatment have been discussed with the patient and family. After consideration of risks, benefits and other options for treatment, the patient has consented to  Procedure(s): Abdominal Aortogram (N/A) as a surgical intervention .  The patient's history has been reviewed, patient examined, no change in status, stable for surgery.  I have reviewed the patient's chart and labs.  Questions were answered to the patient's satisfaction.     [name]

## 2014-12-17 ENCOUNTER — Encounter (HOSPITAL_COMMUNITY): Payer: Self-pay | Admitting: *Deleted

## 2014-12-17 DIAGNOSIS — R600 Localized edema: Secondary | ICD-10-CM | POA: Diagnosis not present

## 2014-12-17 DIAGNOSIS — Z681 Body mass index (BMI) 19 or less, adult: Secondary | ICD-10-CM | POA: Diagnosis not present

## 2014-12-17 DIAGNOSIS — E876 Hypokalemia: Secondary | ICD-10-CM | POA: Diagnosis not present

## 2014-12-17 DIAGNOSIS — C140 Malignant neoplasm of pharynx, unspecified: Secondary | ICD-10-CM | POA: Diagnosis not present

## 2014-12-17 DIAGNOSIS — Z720 Tobacco use: Secondary | ICD-10-CM | POA: Diagnosis not present

## 2014-12-17 DIAGNOSIS — E7800 Pure hypercholesterolemia, unspecified: Secondary | ICD-10-CM | POA: Diagnosis not present

## 2014-12-17 DIAGNOSIS — R636 Underweight: Secondary | ICD-10-CM | POA: Diagnosis not present

## 2014-12-17 DIAGNOSIS — I10 Essential (primary) hypertension: Secondary | ICD-10-CM | POA: Diagnosis not present

## 2014-12-17 DIAGNOSIS — J449 Chronic obstructive pulmonary disease, unspecified: Secondary | ICD-10-CM | POA: Diagnosis not present

## 2014-12-17 DIAGNOSIS — G629 Polyneuropathy, unspecified: Secondary | ICD-10-CM | POA: Diagnosis not present

## 2014-12-17 DIAGNOSIS — M159 Polyosteoarthritis, unspecified: Secondary | ICD-10-CM | POA: Diagnosis not present

## 2014-12-17 DIAGNOSIS — I739 Peripheral vascular disease, unspecified: Secondary | ICD-10-CM | POA: Diagnosis not present

## 2014-12-17 MED ORDER — CEFUROXIME SODIUM 1.5 G IJ SOLR
1.5000 g | INTRAMUSCULAR | Status: AC
  Administered 2014-12-18: 1.5 g via INTRAVENOUS

## 2014-12-17 MED FILL — Morphine Sulfate Inj 2 MG/ML: INTRAMUSCULAR | Qty: 1 | Status: AC

## 2014-12-17 NOTE — Progress Notes (Signed)
Spoke with Ned Card, pt's husband for pre-op call. Pt is unable to speak due to a complete laryngectomy. He states pt has never had any cardiac history, chest pain or sob.

## 2014-12-18 ENCOUNTER — Encounter (HOSPITAL_COMMUNITY)
Admission: RE | Disposition: A | Discharge status: Home / Self Care | Payer: Self-pay | Source: Ambulatory Visit | Attending: Vascular Surgery

## 2014-12-18 ENCOUNTER — Inpatient Hospital Stay (HOSPITAL_COMMUNITY): Payer: Medicare Other

## 2014-12-18 ENCOUNTER — Inpatient Hospital Stay (HOSPITAL_COMMUNITY)
Admission: RE | Admit: 2014-12-18 | Discharge: 2014-12-20 | DRG: 240 | Disposition: A | Discharge status: Home / Self Care | Payer: Medicare Other | Source: Ambulatory Visit | Attending: Vascular Surgery | Admitting: Vascular Surgery

## 2014-12-18 ENCOUNTER — Encounter (HOSPITAL_COMMUNITY): Payer: Self-pay

## 2014-12-18 DIAGNOSIS — D62 Acute posthemorrhagic anemia: Secondary | ICD-10-CM | POA: Diagnosis not present

## 2014-12-18 DIAGNOSIS — E785 Hyperlipidemia, unspecified: Secondary | ICD-10-CM | POA: Diagnosis present

## 2014-12-18 DIAGNOSIS — F17210 Nicotine dependence, cigarettes, uncomplicated: Secondary | ICD-10-CM | POA: Diagnosis present

## 2014-12-18 DIAGNOSIS — Z85819 Personal history of malignant neoplasm of unspecified site of lip, oral cavity, and pharynx: Secondary | ICD-10-CM | POA: Diagnosis not present

## 2014-12-18 DIAGNOSIS — Z79899 Other long term (current) drug therapy: Secondary | ICD-10-CM

## 2014-12-18 DIAGNOSIS — M79671 Pain in right foot: Secondary | ICD-10-CM | POA: Diagnosis not present

## 2014-12-18 DIAGNOSIS — I70234 Atherosclerosis of native arteries of right leg with ulceration of heel and midfoot: Principal | ICD-10-CM | POA: Diagnosis present

## 2014-12-18 DIAGNOSIS — I739 Peripheral vascular disease, unspecified: Secondary | ICD-10-CM

## 2014-12-18 DIAGNOSIS — I779 Disorder of arteries and arterioles, unspecified: Secondary | ICD-10-CM | POA: Diagnosis present

## 2014-12-18 DIAGNOSIS — I70261 Atherosclerosis of native arteries of extremities with gangrene, right leg: Secondary | ICD-10-CM | POA: Diagnosis not present

## 2014-12-18 DIAGNOSIS — L97119 Non-pressure chronic ulcer of right thigh with unspecified severity: Secondary | ICD-10-CM | POA: Diagnosis not present

## 2014-12-18 HISTORY — PX: AMPUTATION: SHX166

## 2014-12-18 HISTORY — DX: Pneumonia, unspecified organism: J18.9

## 2014-12-18 LAB — CREATININE, SERUM: Creatinine, Ser: 0.66 mg/dL (ref 0.44–1.00)

## 2014-12-18 LAB — CBC
HCT: 35.3 % — ABNORMAL LOW (ref 36.0–46.0)
Hemoglobin: 10.9 g/dL — ABNORMAL LOW (ref 12.0–15.0)
MCH: 27.5 pg (ref 26.0–34.0)
MCHC: 30.9 g/dL (ref 30.0–36.0)
MCV: 89.1 fL (ref 78.0–100.0)
PLATELETS: 341 10*3/uL (ref 150–400)
RBC: 3.96 MIL/uL (ref 3.87–5.11)
RDW: 16.8 % — AB (ref 11.5–15.5)
WBC: 6.9 10*3/uL (ref 4.0–10.5)

## 2014-12-18 SURGERY — AMPUTATION, ABOVE KNEE
Anesthesia: General | Site: Leg Upper | Laterality: Right

## 2014-12-18 MED ORDER — SENNOSIDES-DOCUSATE SODIUM 8.6-50 MG PO TABS: 1.0000 | ORAL_TABLET | Freq: Every evening | ORAL | Status: DC | PRN

## 2014-12-18 MED ORDER — DEXTROSE 5 % IV SOLN
1.5000 g | Freq: Two times a day (BID) | INTRAVENOUS | Status: AC
  Administered 2014-12-18 – 2014-12-19 (×2): 1.5 g via INTRAVENOUS
  Filled 2014-12-18 (×2): qty 1.5

## 2014-12-18 MED ORDER — ASPIRIN EC 81 MG PO TBEC: 81.0000 mg | DELAYED_RELEASE_TABLET | Freq: Every day | ORAL | Status: DC | PRN

## 2014-12-18 MED ORDER — FENTANYL CITRATE (PF) 250 MCG/5ML IJ SOLN
INTRAMUSCULAR | Status: AC
  Filled 2014-12-18: qty 5

## 2014-12-18 MED ORDER — LABETALOL HCL 5 MG/ML IV SOLN: 10.0000 mg | INTRAVENOUS | Status: DC | PRN

## 2014-12-18 MED ORDER — ONDANSETRON HCL 4 MG/2ML IJ SOLN
INTRAMUSCULAR | Status: AC
  Filled 2014-12-18: qty 2

## 2014-12-18 MED ORDER — PROPOFOL 10 MG/ML IV BOLUS
INTRAVENOUS | Status: DC | PRN
  Administered 2014-12-18: 90 mg via INTRAVENOUS

## 2014-12-18 MED ORDER — OXYCODONE-ACETAMINOPHEN 5-325 MG PO TABS
1.0000 | ORAL_TABLET | ORAL | Status: DC | PRN
  Administered 2014-12-18 – 2014-12-20 (×5): 2 via ORAL
  Filled 2014-12-18 (×7): qty 2

## 2014-12-18 MED ORDER — MELOXICAM 7.5 MG PO TABS
7.5000 mg | ORAL_TABLET | Freq: Two times a day (BID) | ORAL | Status: DC | PRN
  Administered 2014-12-20: 7.5 mg via ORAL
  Filled 2014-12-18 (×2): qty 1

## 2014-12-18 MED ORDER — PANTOPRAZOLE SODIUM 40 MG PO TBEC
40.0000 mg | DELAYED_RELEASE_TABLET | Freq: Every day | ORAL | Status: DC
  Administered 2014-12-18 – 2014-12-20 (×3): 40 mg via ORAL
  Filled 2014-12-18 (×3): qty 1

## 2014-12-18 MED ORDER — SODIUM CHLORIDE 0.9 % IV SOLN: INTRAVENOUS | Status: DC

## 2014-12-18 MED ORDER — GUAIFENESIN-DM 100-10 MG/5ML PO SYRP: 15.0000 mL | ORAL_SOLUTION | ORAL | Status: DC | PRN

## 2014-12-18 MED ORDER — MEPERIDINE HCL 25 MG/ML IJ SOLN: 6.2500 mg | INTRAMUSCULAR | Status: DC | PRN

## 2014-12-18 MED ORDER — SODIUM CHLORIDE 0.9 % IV SOLN
INTRAVENOUS | Status: DC
  Administered 2014-12-18: 50 mL/h via INTRAVENOUS

## 2014-12-18 MED ORDER — LACTATED RINGERS IV SOLN
INTRAVENOUS | Status: DC
  Administered 2014-12-18: 10:00:00 via INTRAVENOUS

## 2014-12-18 MED ORDER — 0.9 % SODIUM CHLORIDE (POUR BTL) OPTIME
TOPICAL | Status: DC | PRN
  Administered 2014-12-18: 1000 mL

## 2014-12-18 MED ORDER — GABAPENTIN 400 MG PO CAPS
400.0000 mg | ORAL_CAPSULE | Freq: Three times a day (TID) | ORAL | Status: DC
  Administered 2014-12-18 – 2014-12-20 (×6): 400 mg via ORAL
  Filled 2014-12-18 (×6): qty 1

## 2014-12-18 MED ORDER — BISACODYL 10 MG RE SUPP
10.0000 mg | Freq: Every day | RECTAL | Status: DC | PRN
Start: 2014-12-18 — End: 2014-12-20

## 2014-12-18 MED ORDER — LIDOCAINE HCL 4 % MT SOLN
OROMUCOSAL | Status: DC | PRN
  Administered 2014-12-18: 4 mL via TOPICAL

## 2014-12-18 MED ORDER — PHENYLEPHRINE HCL 10 MG/ML IJ SOLN
10.0000 mg | INTRAVENOUS | Status: DC | PRN
  Administered 2014-12-18: 30 ug/min via INTRAVENOUS

## 2014-12-18 MED ORDER — ENOXAPARIN SODIUM 30 MG/0.3ML ~~LOC~~ SOLN
30.0000 mg | SUBCUTANEOUS | Status: DC
Start: 2014-12-19 — End: 2014-12-20
  Administered 2014-12-19 – 2014-12-20 (×2): 30 mg via SUBCUTANEOUS
  Filled 2014-12-18 (×2): qty 0.3

## 2014-12-18 MED ORDER — PHENYLEPHRINE 40 MCG/ML (10ML) SYRINGE FOR IV PUSH (FOR BLOOD PRESSURE SUPPORT)
PREFILLED_SYRINGE | INTRAVENOUS | Status: AC
  Filled 2014-12-18: qty 10

## 2014-12-18 MED ORDER — PHENOL 1.4 % MT LIQD: 1.0000 | OROMUCOSAL | Status: DC | PRN

## 2014-12-18 MED ORDER — CHLORHEXIDINE GLUCONATE CLOTH 2 % EX PADS: 6.0000 | MEDICATED_PAD | Freq: Once | CUTANEOUS | Status: DC

## 2014-12-18 MED ORDER — PROMETHAZINE HCL 25 MG/ML IJ SOLN: 6.2500 mg | INTRAMUSCULAR | Status: DC | PRN

## 2014-12-18 MED ORDER — ACETAMINOPHEN 325 MG RE SUPP: 325.0000 mg | RECTAL | Status: DC | PRN

## 2014-12-18 MED ORDER — MIDAZOLAM HCL 2 MG/2ML IJ SOLN: 0.5000 mg | Freq: Once | INTRAMUSCULAR | Status: DC | PRN

## 2014-12-18 MED ORDER — DOCUSATE SODIUM 100 MG PO CAPS
100.0000 mg | ORAL_CAPSULE | Freq: Every day | ORAL | Status: DC
  Administered 2014-12-20: 100 mg via ORAL
  Filled 2014-12-18 (×2): qty 1

## 2014-12-18 MED ORDER — ALUM & MAG HYDROXIDE-SIMETH 200-200-20 MG/5ML PO SUSP: 15.0000 mL | ORAL | Status: DC | PRN

## 2014-12-18 MED ORDER — POTASSIUM CHLORIDE CRYS ER 20 MEQ PO TBCR
20.0000 meq | EXTENDED_RELEASE_TABLET | Freq: Every day | ORAL | Status: DC | PRN
Start: 2014-12-18 — End: 2014-12-20

## 2014-12-18 MED ORDER — MAGNESIUM SULFATE 2 GM/50ML IV SOLN
2.0000 g | Freq: Every day | INTRAVENOUS | Status: DC | PRN
  Filled 2014-12-18: qty 50

## 2014-12-18 MED ORDER — PHENYLEPHRINE HCL 10 MG/ML IJ SOLN
INTRAMUSCULAR | Status: DC | PRN
  Administered 2014-12-18 (×2): 120 ug via INTRAVENOUS
  Administered 2014-12-18: 80 ug via INTRAVENOUS

## 2014-12-18 MED ORDER — MORPHINE SULFATE (PF) 2 MG/ML IV SOLN
2.0000 mg | INTRAVENOUS | Status: DC | PRN
  Administered 2014-12-18: 2 mg via INTRAVENOUS
  Filled 2014-12-18: qty 1

## 2014-12-18 MED ORDER — PROPOFOL 10 MG/ML IV BOLUS
INTRAVENOUS | Status: AC
  Filled 2014-12-18: qty 20

## 2014-12-18 MED ORDER — METOPROLOL TARTRATE 1 MG/ML IV SOLN: 2.0000 mg | INTRAVENOUS | Status: DC | PRN

## 2014-12-18 MED ORDER — ONDANSETRON HCL 4 MG/2ML IJ SOLN
INTRAMUSCULAR | Status: DC | PRN
  Administered 2014-12-18: 4 mg via INTRAVENOUS

## 2014-12-18 MED ORDER — FENTANYL CITRATE (PF) 100 MCG/2ML IJ SOLN
INTRAMUSCULAR | Status: DC | PRN
  Administered 2014-12-18 (×5): 25 ug via INTRAVENOUS
  Administered 2014-12-18: 50 ug via INTRAVENOUS
  Administered 2014-12-18: 25 ug via INTRAVENOUS
  Administered 2014-12-18: 50 ug via INTRAVENOUS

## 2014-12-18 MED ORDER — FUROSEMIDE 20 MG PO TABS
20.0000 mg | ORAL_TABLET | Freq: Three times a day (TID) | ORAL | Status: DC
  Administered 2014-12-18 – 2014-12-20 (×6): 20 mg via ORAL
  Filled 2014-12-18 (×6): qty 1

## 2014-12-18 MED ORDER — ACETAMINOPHEN 325 MG PO TABS: 325.0000 mg | ORAL_TABLET | ORAL | Status: DC | PRN

## 2014-12-18 MED ORDER — HYDRALAZINE HCL 20 MG/ML IJ SOLN: 5.0000 mg | INTRAMUSCULAR | Status: DC | PRN

## 2014-12-18 MED ORDER — DEXTROSE 5 % IV SOLN
INTRAVENOUS | Status: AC
  Filled 2014-12-18: qty 1.5

## 2014-12-18 MED ORDER — ROCURONIUM BROMIDE 50 MG/5ML IV SOLN
INTRAVENOUS | Status: AC
  Filled 2014-12-18: qty 1

## 2014-12-18 MED ORDER — LIDOCAINE HCL (CARDIAC) 20 MG/ML IV SOLN
INTRAVENOUS | Status: AC
Start: 2014-12-18 — End: 2014-12-18
  Filled 2014-12-18: qty 5

## 2014-12-18 MED ORDER — ONDANSETRON HCL 4 MG/2ML IJ SOLN
4.0000 mg | Freq: Four times a day (QID) | INTRAMUSCULAR | Status: DC | PRN
Start: 2014-12-18 — End: 2014-12-20

## 2014-12-18 MED ORDER — POTASSIUM CHLORIDE CRYS ER 20 MEQ PO TBCR
20.0000 meq | EXTENDED_RELEASE_TABLET | Freq: Two times a day (BID) | ORAL | Status: DC
  Administered 2014-12-18 – 2014-12-20 (×5): 20 meq via ORAL
  Filled 2014-12-18 (×5): qty 1

## 2014-12-18 MED ORDER — HYDROMORPHONE HCL 1 MG/ML IJ SOLN: 0.2500 mg | INTRAMUSCULAR | Status: DC | PRN

## 2014-12-18 SURGICAL SUPPLY — 43 items
BANDAGE ELASTIC 6 VELCRO ST LF (GAUZE/BANDAGES/DRESSINGS) ×2 IMPLANT
BLADE SAW RECIP 87.9 MT (BLADE) ×2 IMPLANT
BNDG COHESIVE 6X5 TAN STRL LF (GAUZE/BANDAGES/DRESSINGS) ×2 IMPLANT
BNDG GAUZE ELAST 4 BULKY (GAUZE/BANDAGES/DRESSINGS) ×2 IMPLANT
CANISTER SUCTION 2500CC (MISCELLANEOUS) ×2 IMPLANT
CLIP TI MEDIUM 6 (CLIP) ×2 IMPLANT
COVER SURGICAL LIGHT HANDLE (MISCELLANEOUS) ×2 IMPLANT
COVER TABLE BACK 60X90 (DRAPES) ×2 IMPLANT
DRAPE ORTHO SPLIT 77X108 STRL (DRAPES) ×2
DRAPE PROXIMA HALF (DRAPES) ×2 IMPLANT
DRAPE SURG ORHT 6 SPLT 77X108 (DRAPES) ×2 IMPLANT
DRSG ADAPTIC 3X8 NADH LF (GAUZE/BANDAGES/DRESSINGS) ×2 IMPLANT
ELECT REM PT RETURN 9FT ADLT (ELECTROSURGICAL) ×2
ELECTRODE REM PT RTRN 9FT ADLT (ELECTROSURGICAL) ×1 IMPLANT
GAUZE SPONGE 4X4 12PLY STRL (GAUZE/BANDAGES/DRESSINGS) ×2 IMPLANT
GLOVE BIO SURGEON STRL SZ7.5 (GLOVE) ×2 IMPLANT
GLOVE BIOGEL PI IND STRL 6.5 (GLOVE) ×3 IMPLANT
GLOVE BIOGEL PI IND STRL 7.0 (GLOVE) ×1 IMPLANT
GLOVE BIOGEL PI IND STRL 7.5 (GLOVE) ×1 IMPLANT
GLOVE BIOGEL PI INDICATOR 6.5 (GLOVE) ×3
GLOVE BIOGEL PI INDICATOR 7.0 (GLOVE) ×1
GLOVE BIOGEL PI INDICATOR 7.5 (GLOVE) ×1
GLOVE ECLIPSE 7.0 STRL STRAW (GLOVE) ×2 IMPLANT
GLOVE SS BIOGEL STRL SZ 6.5 (GLOVE) ×1 IMPLANT
GLOVE SUPERSENSE BIOGEL SZ 6.5 (GLOVE) ×1
GOWN STRL REUS W/ TWL LRG LVL3 (GOWN DISPOSABLE) ×3 IMPLANT
GOWN STRL REUS W/TWL LRG LVL3 (GOWN DISPOSABLE) ×3
KIT BASIN OR (CUSTOM PROCEDURE TRAY) ×2 IMPLANT
KIT ROOM TURNOVER OR (KITS) ×2 IMPLANT
NS IRRIG 1000ML POUR BTL (IV SOLUTION) ×2 IMPLANT
PACK GENERAL/GYN (CUSTOM PROCEDURE TRAY) ×2 IMPLANT
PAD ARMBOARD 7.5X6 YLW CONV (MISCELLANEOUS) ×4 IMPLANT
SPONGE GAUZE 4X4 12PLY STER LF (GAUZE/BANDAGES/DRESSINGS) ×2 IMPLANT
STAPLER VISISTAT 35W (STAPLE) ×2 IMPLANT
STOCKINETTE IMPERVIOUS LG (DRAPES) ×2 IMPLANT
SUT SILK 2 0 SH CR/8 (SUTURE) ×2 IMPLANT
SUT SILK 2 0 TIES 10X30 (SUTURE) ×2 IMPLANT
SUT VIC AB 2-0 CT1 18 (SUTURE) ×2 IMPLANT
SUT VIC AB 2-0 SH 18 (SUTURE) ×2 IMPLANT
SUT VIC AB 3-0 SH 27 (SUTURE) ×2
SUT VIC AB 3-0 SH 27X BRD (SUTURE) ×2 IMPLANT
UNDERPAD 30X30 INCONTINENT (UNDERPADS AND DIAPERS) ×2 IMPLANT
WATER STERILE IRR 1000ML POUR (IV SOLUTION) ×2 IMPLANT

## 2014-12-18 NOTE — Anesthesia Postprocedure Evaluation (Signed)
  Anesthesia Post-op Note  Patient: Heather Jimenez  Procedure(s) Performed: Procedure(s): AMPUTATION ABOVE KNEE (Right)  Patient Location: PACU  Anesthesia Type:General  Level of Consciousness: awake, alert , oriented and patient cooperative  Airway and Oxygen Therapy: Patient Spontanous Breathing and Patient connected to tracheostomy mask oxygen  Post-op Pain: none  Post-op Assessment: Post-op Vital signs reviewed, Patient's Cardiovascular Status Stable, Respiratory Function Stable, Patent Airway, No signs of Nausea or vomiting and Pain level controlled              Post-op Vital Signs: Reviewed and stable  Last Vitals:  Filed Vitals:   12/18/14 1330  BP: 135/69  Pulse: 93  Temp:   Resp: 23    Complications: No apparent anesthesia complications

## 2014-12-18 NOTE — Anesthesia Procedure Notes (Signed)
Procedure Name: Intubation Date/Time: 12/18/2014 12:08 PM Performed by: Deidre Ala Pre-anesthesia Checklist: Patient identified, Emergency Drugs available, Suction available and Patient being monitored Patient Re-evaluated:Patient Re-evaluated prior to inductionOxygen Delivery Method: Circle system utilized Preoxygenation: Pre-oxygenation with 100% oxygen Intubation Type: IV induction Tube type: Reinforced (Lubricated reinforced tube placed in tracheal stoma) Tube size: 7.0 mm Number of attempts: 1 Airway Equipment and Method: LTA kit utilized Placement Confirmation: positive ETCO2 and breath sounds checked- equal and bilateral Tube secured with: Tape

## 2014-12-18 NOTE — Interval H&P Note (Signed)
History and Physical Interval Note:  12/18/2014 11:47 AM  Heather Jimenez  has presented today for surgery, with the diagnosis of Peripheral vascular disease with right foot ulcer I70.234  The various methods of treatment have been discussed with the patient and family. After consideration of risks, benefits and other options for treatment, the patient has consented to  Procedure(s): AMPUTATION ABOVE KNEE (Right) as a surgical intervention .  The patient's history has been reviewed, patient examined, no change in status, stable for surgery.  I have reviewed the patient's chart and labs.  Questions were answered to the patient's satisfaction.     [name]

## 2014-12-18 NOTE — Progress Notes (Signed)
Physical medicine rehabilitation consult requested chart reviewed. Patient right AKA 12/18/2014. Plan physical occupational therapy evaluations and follow-up with appropriate recommendations per physical medicine rehabilitation

## 2014-12-18 NOTE — Progress Notes (Signed)
Scheduled procedure is on Medicare IP only list.  Will need order for INPATIENT post-op. Thanks you

## 2014-12-18 NOTE — H&P (View-Only) (Signed)
Referred by:  Cher Nakai, MD [place], [place], [place] [address]  Reason for referral: PVD  History of Present Illness  Heather Jimenez is a 69 y.o. (1945-04-30) female who presents for evaluation of her left foot wound. She is known to Dr. Oneida Alar from having undergone prior aortobifemoral bypass and right femoral popliteal bypass in 2010. She has not been seen in the office since 2011. She is no longer able to speak following laryngectomy for throat cancer. She is able to mouth words and her history is aided by her husband. The wound on her left foot has been present for at least a month. She had a ultrasound She does not ambulate much and uses a wheelchair. She continues to smoke.   The patient denies any pulmonary or cardiac issues. She has a history of dyslipidemia that is managed on a statin. She is not on any anticoagulation.   Past Medical History  Diagnosis Date  . PAD (peripheral artery disease)   . Dyslipidemia   . Carotid bruit   . Tobacco dependence   . Throat cancer     Past Surgical History  Procedure Laterality Date  . Aorta - bilateral femoral artery bypass graft    . Tonsillectomy    . Total laryngectomy    . Peg tube placement    . Peg tube removal      Social History   Social History  . Marital Status: Married    Spouse Name: N/A  . Number of Children: N/A  . Years of Education: N/A   Occupational History  . Not on file.   Social History Main Topics  . Smoking status: Current Every Day Smoker -- 1.00 packs/day  . Smokeless tobacco: Not on file  . Alcohol Use: No  . Drug Use: No  . Sexual Activity: Not on file   Other Topics Concern  . Not on file   Social History Narrative    Family History  Problem Relation Age of Onset  . Cancer Mother   . Lung cancer Brother      Current Outpatient Prescriptions on File Prior to Visit  Medication Sig Dispense Refill  . ACETAMINOPHEN PO Take by mouth as needed.      . gabapentin  (NEURONTIN) 300 MG capsule Take 300 mg by mouth daily.      . metoprolol tartrate (LOPRESSOR) 25 MG tablet Take 25 mg by mouth 2 (two) times daily.      Marland Kitchen atorvastatin (LIPITOR) 80 MG tablet Take 80 mg by mouth daily.      Marland Kitchen ezetimibe (ZETIA) 10 MG tablet Take 1 tablet (10 mg total) by mouth daily. (Patient not taking: Reported on 12/13/2014) 30 tablet 0   No current facility-administered medications on file prior to visit.    No Known Allergies    REVIEW OF SYSTEMS:  (Positives checked otherwise negative)  CARDIOVASCULAR:  '[]'$  chest pain, '[]'$  chest pressure, '[]'$  palpitations, '[]'$  shortness of breath when laying flat, '[]'$  shortness of breath with exertion,  '[]'$  pain in feet when walking, '[x]'$  pain in feet when laying flat, '[]'$  history of blood clot in veins (DVT), '[]'$  history of phlebitis, '[]'$  swelling in legs, '[]'$  varicose veins  PULMONARY:  '[]'$  productive cough, '[]'$  asthma, '[]'$  wheezing  NEUROLOGIC:  '[]'$  weakness in arms or legs, '[]'$  numbness in arms or legs, '[]'$  difficulty speaking or slurred speech, '[]'$  temporary loss of vision in one eye, '[]'$  dizziness  HEMATOLOGIC:  '[]'$  bleeding  problems, '[]'$  problems with blood clotting too easily  MUSCULOSKEL:  '[]'$  joint pain, '[]'$  joint swelling  GASTROINTEST:  '[]'$  vomiting blood, '[]'$  blood in stool     GENITOURINARY:  '[]'$  burning with urination, '[]'$  blood in urine  PSYCHIATRIC:  '[]'$  history of major depression  INTEGUMENTARY:  '[]'$  rashes, '[]'$  ulcers  CONSTITUTIONAL:  '[]'$  fever, '[]'$  chills  For VQI Use Only  PRE-ADM LIVING: Home  AMB STATUS: Wheelchair  Physical Examination  Filed Vitals:   12/13/14 1459 12/13/14 1506  BP: 179/113 149/101  Pulse: 90   Temp: 98.3 F (36.8 C)   TempSrc: Oral   Height: '5\' 3"'$  (1.6 m)   Weight: 88 lb (39.917 kg)   SpO2: 97%    Body mass index is 15.59 kg/(m^2).  General: A&O x 3, thin, frail appearing female  Pulmonary: Non labored breathing  Vascular: Nonpalpable right femoral pulse. Palpable left femoral pulse. Non  palpable pedal pulses. Large full thickness ulceration to dorsum of right foot. Ulceration without drainage.   Neurologic: No focal deficits  Musculoskeletal: Diffuse muscle atrophy   Psychiatric: Judgment intact, Mood & affect appropriate for pt's clinical situation  Non-Invasive Vascular Imaging  ABI (Date: 12/13/2014)  Unable to be performed due to patient intolerance of test  Outside Studies/Documentation 4 pages of outside documents were reviewed including: medical records from PCP.  Medical Decision Making  Yasaman Kolek is a 69 y.o. female who presents with large ulceration of right foot s/p aortobifemoral bypass and right femoral-popliteal bypass in 2010.   Based on physical exam, the right limb of her aortobifemoral bypass is occluded as well as her right femoral-popliteal bypass. She is at very high risk for amputation of her right lower extremity. Plan for aortogram with lower extremity runoff tomorrow to evaluate for possible revascularization options with Dr. Oneida Alar.   Virgina Jock, PA-C [facility] Office: [phone] Pager: [phone]  12/13/2014, 3:31 PM  This patient was seen in conjunction with Dr. Oneida Alar    History and exam details as above. Patient has a large wound on her right foot it is 7 x 7 cm and is full-thickness loss of skin. Most likely this foot is not going to be salvageable. However I believe she should at least have an arteriogram to see whether or not she has any revascularization options. Most likely the right limb of her aortobifemoral bypass graft has occluded which has also occluded her right femoropopliteal bypass. She may not have enough flow to heal even out above-knee amputation at this point. I would consider doing a femoral femoral bypass if she has a reasonable target. However, given her current frail state she may only be a candidate for amputation.  I have scheduled her for aortogram and lower  extremity runoff tomorrow and we will have further consideration at that point whether or not she would need revascularization versus primary amputation.  Risks benefits possible complications and procedure details were discussed patient and her husband today. Understand and agree to proceed.  [name], MD [facility] Office: [phone] Pager: [phone]

## 2014-12-18 NOTE — Anesthesia Preprocedure Evaluation (Addendum)
Anesthesia Evaluation  Patient identified by MRN, date of birth, ID band Patient awake    Reviewed: Allergy & Precautions, NPO status , Patient's Chart, lab work & pertinent test results  Airway Mallampati: Trach       Dental  (+) Edentulous Upper, Edentulous Lower   Pulmonary COPD,  COPD inhaler, Current Smoker,  S/p laryngectomy: tracheal stoma   breath sounds clear to auscultation       Cardiovascular + CAD and + Peripheral Vascular Disease   Rhythm:Regular Rate:Normal  '10 stress: inferior wall fixed defect, EF 73%   Neuro/Psych    GI/Hepatic Neg liver ROS, Has PEG   Endo/Other  negative endocrine ROS  Renal/GU negative Renal ROS     Musculoskeletal   Abdominal   Peds  Hematology   Anesthesia Other Findings   Reproductive/Obstetrics                        Anesthesia Physical Anesthesia Plan  ASA: III  Anesthesia Plan: General   Post-op Pain Management:    Induction: Intravenous  Airway Management Planned: Tracheostomy  Additional Equipment:   Intra-op Plan:   Post-operative Plan: Extubation in OR  Informed Consent: I have reviewed the patients History and Physical, chart, labs and discussed the procedure including the risks, benefits and alternatives for the proposed anesthesia with the patient or authorized representative who has indicated his/her understanding and acceptance.   Dental advisory given  Plan Discussed with: CRNA, Anesthesiologist and Surgeon  Anesthesia Plan Comments: (Plan routine monitors, GETA with ETT in stoma)       Anesthesia Quick Evaluation

## 2014-12-18 NOTE — Op Note (Signed)
[facility] OPERATIVE NOTE   Procedure: Right above knee amputation  Surgeon(s): Elam Dutch, MD  ASSISTANT: Gerri Lins, PA-C  Anesthesia: General  Specimens: Right leg  PROCEDURE DETAIL: After obtaining informed consent, the patient was taken to the operating room. The patient was placed in supine position the operating room table. After induction of general anesthesia and endotracheal intubation the patient's Foley catheter was placed. Next patient's entire right lower extremity was prepped and draped in usual sterile fashion. A circumferential incision was made on the right leg just above the knee. The incision was carried down into the sucutaneous tissues down to level the saphenous vein. This was ligated and divided between silk ties. Soft tissues were taken down as well as the muscle and fascia with cautery. The superficial femoral artery and vein were dissected free circumferentially clamped and divided. These were suture ligated proximally. Remainder of the soft tissues were taken down with cautery. The periosteum was raised on the femur approximately 5 cm above the skin edge. The femur was divided at this level. The leg was passed off the table as a specimen. Hemostasis was obtained. The wound was thoroughly irrigated with normal saline solution. The fascial edges were reapproximated using interrupted 2 0 Vicryl sutures.  The skin was closed with staples. Patient tolerated procedure well and there were no complications. Instrument sponge and needle counts were correct at the end of the case. Patient was taken to recovery in stable condition.  [name], MD [facility] [place] Office: [phone] Pager: [phone]

## 2014-12-18 NOTE — Progress Notes (Signed)
Pharmacy Consult: Antibiotics renal dose adjustment  69 YO F s/p R AKA, getting zinacef for post-op prophylaxis. Pharmacy is consulted for antibiotics renal dose adjustment. Scr 0.64 on 9/30 , est. crcl ~35 ml/min. She received pre-op dose at 1215.  Plan: Zinacef 1.5 g IV Q 12 hrs x 2 doses as ordered, next dose at 2300, no adjustment needed. Pharmacy sign off.   Thanks.  Maryanna Shape, PharmD, BCPS  Clinical Pharmacist  Pager: [phone]

## 2014-12-18 NOTE — Transfer of Care (Signed)
Immediate Anesthesia Transfer of Care Note  Patient: Heather Jimenez  Procedure(s) Performed: Procedure(s): AMPUTATION ABOVE KNEE (Right)  Patient Location: PACU  Anesthesia Type:General  Level of Consciousness: awake and alert   Airway & Oxygen Therapy: Patient Spontanous Breathing and facemask covering stoma  Post-op Assessment: Report given to RN, Post -op Vital signs reviewed and stable and Patient moving all extremities  Post vital signs: Reviewed and stable  Last Vitals:  Filed Vitals:   12/18/14 0909  BP: 145/96  Pulse: 102  Temp: 36.5 C  Resp: 17    Complications: No apparent anesthesia complications

## 2014-12-19 ENCOUNTER — Encounter (HOSPITAL_COMMUNITY): Payer: Self-pay | Admitting: Vascular Surgery

## 2014-12-19 LAB — CBC
HEMATOCRIT: 31.9 % — AB (ref 36.0–46.0)
HEMOGLOBIN: 10.1 g/dL — AB (ref 12.0–15.0)
MCH: 28.9 pg (ref 26.0–34.0)
MCHC: 31.7 g/dL (ref 30.0–36.0)
MCV: 91.1 fL (ref 78.0–100.0)
Platelets: 281 10*3/uL (ref 150–400)
RBC: 3.5 MIL/uL — AB (ref 3.87–5.11)
RDW: 17 % — ABNORMAL HIGH (ref 11.5–15.5)
WBC: 8.8 10*3/uL (ref 4.0–10.5)

## 2014-12-19 LAB — BASIC METABOLIC PANEL
ANION GAP: 10 (ref 5–15)
BUN: <5 mg/dL — ABNORMAL LOW (ref 6–20)
CALCIUM: 8.1 mg/dL — AB (ref 8.9–10.3)
CO2: 28 mmol/L (ref 22–32)
Chloride: 96 mmol/L — ABNORMAL LOW (ref 101–111)
Creatinine, Ser: 0.62 mg/dL (ref 0.44–1.00)
Glucose, Bld: 138 mg/dL — ABNORMAL HIGH (ref 65–99)
POTASSIUM: 3.7 mmol/L (ref 3.5–5.1)
Sodium: 134 mmol/L — ABNORMAL LOW (ref 135–145)

## 2014-12-19 NOTE — Evaluation (Signed)
Occupational Therapy Evaluation Patient Details Name: Heather Jimenez MRN: [id number] DOB: [birth year] Today's Date: 12/19/2014    History of Present Illness Right above knee amputation. 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 Pt has hx of throat CA with laryngectomy; she uses dry erase board for communication and cannot speak PTA, pt using w/c for mobility and spouse providing assist with selfcare. Pt writes that she was still able to bake cookies recently and that enjoys baking. Pt and her spouse are planning for her to d/c to SNF rehab in [place] after acute care stay. Pt would benefit from acute care services to address impairments to increase level of function and safety    Follow Up Recommendations  SNF;Supervision/Assistance - 24 hour    Equipment Recommendations  Other (comment) (TBD at next vebue of care)    Recommendations for Other Services       Precautions / Restrictions Precautions Precautions: Fall Restrictions Weight Bearing Restrictions: Yes RLE Weight Bearing: Non weight bearing      Mobility Bed Mobility Overal bed mobility: Needs Assistance Bed Mobility: Sit to Supine;Supine to Sit     Supine to sit: Supervision Sit to supine: Supervision      Transfers Overall transfer level: Needs assistance Equipment used: Rolling walker (2 wheeled) Transfers: Sit to/from Stand Sit to Stand: Min assist              Balance Overall balance assessment: Needs assistance Sitting-balance support: No upper extremity supported;Feet supported Sitting balance-Leahy Scale: Good     Standing balance support: Bilateral upper extremity supported;During functional activity Standing balance-Leahy Scale: Poor                              ADL Overall ADL's : Needs  assistance/impaired     Grooming: Wash/dry face;Wash/dry hands;Brushing hair;Sitting;Set up   Upper Body Bathing: Set up;Sitting   Lower Body Bathing: Minimal assistance;Sitting/lateral leans   Upper Body Dressing : Set up;Sitting   Lower Body Dressing: Minimal assistance;Sitting/lateral leans   Toilet Transfer: Minimal assistance;+2 for physical assistance;+2 for safety/equipment;RW;BSC   Toileting- Clothing Manipulation and Hygiene: Minimal assistance;Sitting/lateral lean       Functional mobility during ADLs: Minimal assistance;+2 for physical assistance;+2 for safety/equipment;Cueing for safety;Rolling walker       Vision  reading glasses   Perception Perception Perception Tested?: No   Praxis Praxis Praxis tested?: Not tested    Pertinent Vitals/Pain Pain Assessment: 0-10 Pain Score: 6  Pain Location: R residual limb Pain Descriptors / Indicators: Aching;Sore;Tender Pain Intervention(s): Monitored during session     Hand Dominance Left   Extremity/Trunk Assessment Upper Extremity Assessment Upper Extremity Assessment: Generalized weakness           Communication Communication Communication: Expressive difficulties;Other (comment) (hx of throat CA with laryngectomy, cannot speak and uses dry erase board for communication)   Cognition Arousal/Alertness: Awake/alert Behavior During Therapy: WFL for tasks assessed/performed Overall Cognitive Status: Within Functional Limits for tasks assessed                     General Comments   pt pleasant and cooperative, husband very supportive                 Home Living Family/patient expects to be discharged  to:: Private residence Living Arrangements: Spouse/significant other   Type of Home: Mobile home Home Access: Stairs to enter [employer] of Steps: 4   Home Layout: One level     Bathroom Shower/Tub: Tub/shower unit Shower/tub characteristics: [place]:  Environmental consultant - 2 wheels;Wheelchair - manual;Shower seat          Prior Functioning/Environment Level of Independence: Needs assistance  Gait / Transfers Assistance Needed: pt uses w/c at home ADL's / Homemaking Assistance Needed: Assist with bathing (sponge bathes) and dressing from spouse        OT Diagnosis: Generalized weakness;Acute pain   OT Problem List: Decreased strength;Decreased knowledge of use of DME or AE;Impaired balance (sitting and/or standing);Pain;Decreased activity tolerance   OT Treatment/Interventions: Self-care/ADL training;Patient/family education;Therapeutic activities;DME and/or AE instruction    OT Goals(Current goals can be found in the care plan section) Acute Rehab OT Goals Patient Stated Goal: go to rehab and go home per spouse OT Goal Formulation: With patient/family Time For Goal Achievement: 12/26/14 Potential to Achieve Goals: Good ADL Goals Pt Will Perform Lower Body Bathing: with min assist;sitting/lateral leans Pt Will Perform Lower Body Dressing: with min assist;sitting/lateral leans Pt Will Transfer to Toilet: with min guard assist;bedside commode Pt Will Perform Toileting - Clothing Manipulation and hygiene: with min guard assist;with supervision;sitting/lateral leans  OT Frequency: Min 2X/week   Barriers to D/C: Decreased caregiver support  planning to go to SNF in [place] after acute d/c for further rehab before returning home                     End of Session Equipment Utilized During Treatment: Gait belt;Rolling walker;Other (comment) (BSC)  Activity Tolerance: Patient tolerated treatment well Patient left: in bed;with call bell/phone within reach;with family/visitor present   Time: 1131-1204 OT Time Calculation (min): 33 min Charges:  OT General Charges $OT Visit: 1 Procedure OT Evaluation $Initial OT Evaluation Tier I: 1 Procedure OT Treatments $Self Care/Home Management : 8-22 mins $Therapeutic Activity: 8-22  mins G-Codes:    Britt Bottom 12/19/2014, 1:47 PM

## 2014-12-19 NOTE — Progress Notes (Addendum)
  [facility] Progress Note  12/19/2014 10:18 AM POD 1  Subjective:  No complaints. Wants to go home.   Filed Vitals:   12/19/14 0822  BP: 92/44  Pulse: 112  Temp: 99.5 F (37.5 C)  Resp: 19    Physical Exam: Right AKA dressing clean and dry.   CBC    Component Value Date/Time   WBC 8.8 12/19/2014 0400   RBC 3.50* 12/19/2014 0400   HGB 10.1* 12/19/2014 0400   HCT 31.9* 12/19/2014 0400   PLT 281 12/19/2014 0400   MCV 91.1 12/19/2014 0400   MCH 28.9 12/19/2014 0400   MCHC 31.7 12/19/2014 0400   RDW 17.0* 12/19/2014 0400   LYMPHSABS 1.1 12/20/2009 1709   MONOABS 1.0 12/20/2009 1709   EOSABS 0.0 12/20/2009 1709   BASOSABS 0.0 12/20/2009 1709    BMET    Component Value Date/Time   NA 134* 12/19/2014 0400   K 3.7 12/19/2014 0400   CL 96* 12/19/2014 0400   CO2 28 12/19/2014 0400   GLUCOSE 138* 12/19/2014 0400   BUN <5* 12/19/2014 0400   CREATININE 0.62 12/19/2014 0400   CALCIUM 8.1* 12/19/2014 0400   GFRNONAA >60 12/19/2014 0400   GFRAA >60 12/19/2014 0400    INR    Component Value Date/Time   INR 1.7* 08/09/2008 2200     Intake/Output Summary (Last 24 hours) at 12/19/14 1018 Last data filed at 12/19/14 0902  Gross per 24 hour  Intake   1680 ml  Output    600 ml  Net   1080 ml     Assessment/Plan:  69 y.o. female is s/p right above knee amputation  POD 1  Will take down dressing tomorrow.  ABLA tolerating. CSW for SNF placement. Patient wants to go to rehab facility in [place].     Virgina Jock, PA-C [facility] Office: [phone] Pager: [phone] 12/19/2014 10:18 AM  Agree with above.  Plan for d/c tomorrow if SNF or rehab [place] or home decision made  [name], MD [facility] [place] Office: [phone] Pager: [phone]

## 2014-12-19 NOTE — Evaluation (Signed)
Physical Therapy Evaluation Patient Details Name: Heather Jimenez MRN: [id number] DOB: 1945-05-14 Today's Date: 12/19/2014   History of Present Illness  Right above knee amputation. 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 Pt currently with functional limitations due to the deficits listed below (see PT Problem List). At the time of PT eval pt was able to perform squat pivot transfer bed>chair with min assist for balance/support. Did not attempt taking steps with RW due to pt fatigue. Husband not present at time of PT eval, however it appears that the patient is planning for rehab in [place] at d/c. Pt will benefit from skilled PT to increase their independence and safety with mobility to allow discharge to the venue listed below.       Follow Up Recommendations CIR ([place])    Equipment Recommendations  Wheelchair (measurements PT) (With elevating leg rests)    Recommendations for Other Services       Precautions / Restrictions Precautions Precautions: Fall Restrictions Weight Bearing Restrictions: Yes RLE Weight Bearing: Non weight bearing      Mobility  Bed Mobility Overal bed mobility: Needs Assistance Bed Mobility: Supine to Sit     Supine to sit: Supervision Sit to supine: Supervision   General bed mobility comments: HOB elevated and railing use for support  Transfers Overall transfer level: Needs assistance Equipment used: 1 person hand held assist Transfers: Squat Pivot Transfers Sit to Stand: Min assist   Squat pivot transfers: Min assist     General transfer comment: Assist to squat pivot from bed to recliner (set-up at a 45 angle to the bed). Assist for support and management of IV lines  Ambulation/Gait                Stairs            Wheelchair Mobility    Modified Rankin (Stroke Patients Only)       Balance Overall balance assessment:  Needs assistance Sitting-balance support: Feet supported;No upper extremity supported Sitting balance-Leahy Scale: Good     Standing balance support: Bilateral upper extremity supported;During functional activity Standing balance-Leahy Scale: Poor                               Pertinent Vitals/Pain Pain Assessment: Faces Pain Score: 6  Faces Pain Scale: Hurts a little bit Pain Location: R residual limb Pain Descriptors / Indicators: Operative site guarding Pain Intervention(s): Limited activity within patient's tolerance;Monitored during session;Repositioned    Home Living Family/patient expects to be discharged to:: Private residence Living Arrangements: Spouse/significant other   Type of Home: Mobile home Home Access: Stairs to enter   Technical brewer of Steps: 4 Home Layout: One level Home Equipment: Environmental consultant - 2 wheels;Wheelchair - manual;Shower seat (no leg rests for wheelchair)      Prior Function Level of Independence: Needs assistance   Gait / Transfers Assistance Needed: pt uses w/c at home  ADL's / Homemaking Assistance Needed: Assist with bathing (sponge bathes) and dressing from spouse        Hand Dominance   Dominant Hand: Left    Extremity/Trunk Assessment   Upper Extremity Assessment: Defer to OT evaluation           Lower Extremity Assessment: RLE deficits/detail RLE Deficits / Details: AKA    Cervical / Trunk Assessment: Normal  Communication   Communication: Expressive  difficulties;Other (comment) (hx of laryngectomy, cannot speak and uses dry erase board)  Cognition Arousal/Alertness: Awake/alert Behavior During Therapy: WFL for tasks assessed/performed Overall Cognitive Status: Within Functional Limits for tasks assessed                      General Comments      Exercises        Assessment/Plan    PT Assessment Patient needs continued PT services  PT Diagnosis Difficulty walking;Generalized  weakness   PT Problem List Decreased strength;Decreased range of motion;Decreased activity tolerance;Decreased balance;Decreased mobility;Decreased knowledge of use of DME;Decreased safety awareness;Decreased knowledge of precautions;Pain  PT Treatment Interventions DME instruction;Stair training;Functional mobility training;Therapeutic activities;Therapeutic exercise;Neuromuscular re-education;Patient/family education;Gait training;Wheelchair mobility training   PT Goals (Current goals can be found in the Care Plan section) Acute Rehab PT Goals Patient Stated Goal: go to rehab and go home per OT PT Goal Formulation: With patient Time For Goal Achievement: 01/02/15 Potential to Achieve Goals: Good    Frequency Min 4X/week   Barriers to discharge        Co-evaluation               End of Session Equipment Utilized During Treatment: Gait belt Activity Tolerance: Patient tolerated treatment well Patient left: in chair;with call bell/phone within reach Nurse Communication: Mobility status         Time: 1425-1447 PT Time Calculation (min) (ACUTE ONLY): 22 min   Charges:   PT Evaluation $Initial PT Evaluation Tier I: 1 Procedure     PT G CodesRolinda Roan 11-Jan-2015, 3:09 PM   Rolinda Roan, PT, DPT Acute Rehabilitation Services Pager: [phone]

## 2014-12-19 NOTE — Progress Notes (Signed)
Post-op dressing fell off of right stump. New dressing applied of non-adherent dressing, 4x4 gauze pads, kerlix, and an ace bandage.  Very small amount of old, bloody drainage noted on old dressing.

## 2014-12-20 LAB — CBC
HCT: 31.5 % — ABNORMAL LOW (ref 36.0–46.0)
Hemoglobin: 9.7 g/dL — ABNORMAL LOW (ref 12.0–15.0)
MCH: 28.4 pg (ref 26.0–34.0)
MCHC: 30.8 g/dL (ref 30.0–36.0)
MCV: 92.4 fL (ref 78.0–100.0)
Platelets: 264 10*3/uL (ref 150–400)
RBC: 3.41 MIL/uL — AB (ref 3.87–5.11)
RDW: 16.5 % — ABNORMAL HIGH (ref 11.5–15.5)
WBC: 9.4 10*3/uL (ref 4.0–10.5)

## 2014-12-20 LAB — BASIC METABOLIC PANEL
ANION GAP: 9 (ref 5–15)
BUN: 9 mg/dL (ref 6–20)
CALCIUM: 8.2 mg/dL — AB (ref 8.9–10.3)
CHLORIDE: 101 mmol/L (ref 101–111)
CO2: 26 mmol/L (ref 22–32)
CREATININE: 0.58 mg/dL (ref 0.44–1.00)
GFR calc non Af Amer: >60 mL/min (ref >60)
Glucose, Bld: 131 mg/dL — ABNORMAL HIGH (ref 65–99)
Potassium: 4.4 mmol/L (ref 3.5–5.1)
SODIUM: 136 mmol/L (ref 135–145)

## 2014-12-20 MED ORDER — OXYCODONE-ACETAMINOPHEN 5-325 MG PO TABS: 1.0000 | ORAL_TABLET | ORAL | Status: DC | PRN

## 2014-12-20 NOTE — Progress Notes (Signed)
CSW informed that pt is wanting to return home and get rehab as outpatient.  CSW signing off at this time- please reconsult if CSW needs arise  Domenica Reamer, [place] Social Worker [phone]

## 2014-12-20 NOTE — Care Management Important Message (Signed)
Important Message  Patient Details  Name: Heather Jimenez MRN: [id number] Date of Birth: [birth year]   Medicare Important Message Given:  N/A - LOS <3 / Initial given by admissions    Dawayne Patricia, RN 12/20/2014, 10:34 AM

## 2014-12-20 NOTE — Progress Notes (Signed)
Pt being discharged home via wheelchair with husband. Pt alert and oriented x4. VSS. Pt c/o no pain at this time. No signs of respiratory distress. Education complete and care plans resolved. IV removed with catheter intact and pt tolerated well. No further issues at this time. Pt to follow up with PCP and outpatient PT/OT. Leanne Chang, RN

## 2014-12-20 NOTE — Progress Notes (Addendum)
[facility] [place]  Subjective  - Patient comfortable.  She wants to go home and do out patient PT.   Objective 108/60 89 98.9 F (37.2 C) (Oral) 18 94%  Intake/Output Summary (Last 24 hours) at 12/20/14 0751 Last data filed at 12/19/14 1855  Gross per 24 hour  Intake    720 ml  Output      0 ml  Net    720 ml    Right stump healing well Incision without drainage No erythema   Assessment/Planning: POD # 2 Right AKA   Patient wants to go home and do [facility] verses out patient rehab. Possible discharge home today.  Laurence Slate [facility] 12/20/2014 7:51 AM -- Agree with above D/c home today if we can get outpt rehab setup  [name], MD [facility] [place]: [phone] Pager: [phone]  Laboratory Lab Results:  Recent Labs  12/19/14 0400 12/20/14 0300  WBC 8.8 9.4  HGB 10.1* 9.7*  HCT 31.9* 31.5*  PLT 281 264   BMET  Recent Labs  12/19/14 0400 12/20/14 0300  NA 134* 136  K 3.7 4.4  CL 96* 101  CO2 28 26  GLUCOSE 138* 131*  BUN <5* 9  CREATININE 0.62 0.58  CALCIUM 8.1* 8.2*    COAG Lab Results  Component Value Date   INR 1.7* 08/09/2008   INR 1.5 08/09/2008   INR 1.0 08/09/2008   No results found for: PTT

## 2014-12-20 NOTE — Progress Notes (Signed)
Physical Therapy Treatment Patient Details Name: Heather Jimenez MRN: [id number] DOB: Jul 17, 1945 Today's Date: 12/20/2014    History of Present Illness Right above knee amputation. 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 Husband present during session and confirmed d/c plans with pt, husband, and case Freight forwarder. Pt and husband are planning for pt to return home at d/c with pt traveling to outpatient therapy for follow-up. At baseline, pt typically transfers to/from wheelchair, and is requiring min assist for basic transfers at this time. Feel pt and husband will be able to manage fairly well at home as they already have wheelchair and have been used to entering home/negotiating around home with w/c. Pt declining short term HHPT and will therefore need immediate outpatient therapy services at d/c. Will continue to follow.   Follow Up Recommendations  Outpatient PT;Supervision for mobility/OOB     Equipment Recommendations  None recommended by PT    Recommendations for Other Services       Precautions / Restrictions Precautions Precautions: Fall Restrictions Weight Bearing Restrictions: Yes RLE Weight Bearing: Non weight bearing    Mobility  Bed Mobility Overal bed mobility: Needs Assistance Bed Mobility: Supine to Sit     Supine to sit: Min guard     General bed mobility comments: HOB flat - pt pulling on sheets to elevate trunk. Min guard provided to get pt into full sitting position and gain/maintain balance.   Transfers Overall transfer level: Needs assistance Equipment used: Rolling walker (2 wheeled) Transfers: Sit to/from [facility] Sit to Stand: Min assist Stand pivot transfers: Min guard       General transfer comment: Pt was able to power-up to full standing with min assist and increased time. Good stability with the RW and pt was able to take a  few hopping steps from bed to chair. VC's for hand placement on seated surface for safety prior to initiating stand>sit.   Ambulation/Gait             General Gait Details: ~3 feet during pivotal steps bed>chair. See transfer details.    Stairs            Wheelchair Mobility    Modified Rankin (Stroke Patients Only)       Balance Overall balance assessment: Needs assistance Sitting-balance support: Feet supported Sitting balance-Leahy Scale: Good     Standing balance support: Bilateral upper extremity supported;During functional activity Standing balance-Leahy Scale: Poor Standing balance comment: Requires UE support to maintain standing balance.                     Cognition Arousal/Alertness: Awake/alert Behavior During Therapy: WFL for tasks assessed/performed Overall Cognitive Status: Within Functional Limits for tasks assessed                      Exercises      General Comments        Pertinent Vitals/Pain Pain Assessment: No/denies pain    Home Living                      Prior Function            PT Goals (current goals can now be found in the care plan section) Acute Rehab PT Goals Patient Stated Goal: Go home and get rehab at outpatient PT Goal Formulation: With patient Time  For Goal Achievement: 01/02/15 Potential to Achieve Goals: Good Progress towards PT goals: Progressing toward goals    Frequency  Min 4X/week    PT Plan Current plan remains appropriate    Co-evaluation             End of Session Equipment Utilized During Treatment: Gait belt Activity Tolerance: Patient tolerated treatment well Patient left: in chair;with call bell/phone within reach;with family/visitor present     Time: 6886-4847 PT Time Calculation (min) (ACUTE ONLY): 35 min  Charges:  $Therapeutic Activity: 23-37 mins                    G Codes:      Rolinda Roan 12-22-2014, 10:36 AM   Rolinda Roan, PT,  DPT Acute Rehabilitation Services Pager: [phone]

## 2014-12-20 NOTE — Care Management Note (Signed)
Case Management Note [name] RN, BSN Unit 2W-Case Manager [phone]  Patient Details  Name: Heather Jimenez MRN: [id number] Date of Birth: 07/09/1945  Subjective/Objective:      Pt admitted s/p Right AKA               Action/Plan: PTA pt lived at home with spouse- spoke with pt and spouse along with PT at bedside- per conversation pt has walker and wheelchair at home already- tub bench has been ordered - have spoken with Brenton Grills at [facility] - to be delivered to room prior to discharge- per spouse no other DME needs. Pt does not want to go to [facility] desires to return home and go to outpt PT in [place]- would like [facility]- have made call to see if they offer outpt therapy- which they do- have also given contact info to the [facility] outpt rehab along with [facility] in [place]- pt will have a script given to her for outpt PT/OT.  Pt and spouse will f/u with arranging outpt therapy in [place]  Expected Discharge Date:       12/20/14           Expected Discharge Plan:  Home/Self Care  In-House Referral:     Discharge planning Services  CM Consult  Post Acute Care Choice:  Durable Medical Equipment Choice offered to:  Patient  DME Arranged:  Tub bench DME Agency:  [place]:    [facility] Agency:     Status of Service:  Completed, signed off  Medicare Important Message Given:    Date Medicare IM Given:    Medicare IM give by:    Date Additional Medicare IM Given:    Additional Medicare Important Message give by:     If discussed at [place] of Stay Meetings, dates discussed:    Additional Comments:  Dawayne Patricia, RN 12/20/2014, 10:23 AM

## 2014-12-21 ENCOUNTER — Telehealth: Payer: Self-pay | Admitting: Vascular Surgery

## 2014-12-21 NOTE — Telephone Encounter (Addendum)
-----   Message from Heather Dutch, MD sent at 12/21/2014 10:59 AM EDT ----- Regarding: RE: Post Op Follow up 1 month  [name] ----- Message -----    From: Doristine Section    Sent: 12/21/2014  10:42 AM      To: Heather Dutch, MD, Mena Goes, RN, # Subject: Post Heather Jimenez's husband called asking for a post op appointment for staple removal.   I don't see that we received a staff message on this one.  When do you want to see her for staple removal?  [name]   notified patient's husband of post op appt. on 01-17-15 at 11:30 with dr. Oneida Alar

## 2014-12-24 NOTE — Discharge Summary (Signed)
[facility] Discharge Summary   Patient ID:  GARLAND SMOUSE MRN: [id number] DOB/AGE: 09/05/1945 69 y.o.  Admit date: 12/18/2014 Discharge date: 12/24/2014 Date of Surgery: 12/18/2014 Surgeon: Surgeon(s): Elam Dutch, MD  Admission Diagnosis: Peripheral vascular disease with right foot ulcer I70.234  Discharge Diagnoses:  Peripheral vascular disease with right foot ulcer I70.234  Secondary Diagnoses: Past Medical History  Diagnosis Date  . PAD (peripheral artery disease) ([place])   . Dyslipidemia   . Carotid bruit   . Tobacco dependence   . Throat cancer ([place])   . Pneumonia     Procedure(s): AMPUTATION ABOVE KNEE  Discharged Condition: good  HPI: Adamae Ricklefs is a 69 y.o. (07/31/1945) female who presents for evaluation of her left foot wound. She is known to Dr. Oneida Alar from having undergone prior aortobifemoral bypass and right femoral popliteal bypass in 2010. She has not been seen in the office since 2011. She is no longer able to speak following laryngectomy for throat cancer. She is able to mouth words and her history is aided by her husband. The wound on her left foot has been present for at least a month. She had a ultrasound She does not ambulate much and uses a wheelchair. She continues to smoke.   The patient denies any pulmonary or cardiac issues. She has a history of dyslipidemia that is managed on a statin. She is not on any anticoagulation  She was scheduled her for aortogram and lower extremity runoff tomorrow and we will have further consideration at that point whether or not she would need revascularization versus primary amputation.  Post angiogram:  Per Dr. Oneida Alar' note Operative management: The patient is in overall poor condition and has a nonsalvageable right foot. She is nonambulatory and has contracture of her right leg. She should have adequate flow into her profunda for healing a right above-knee amputation and she will be scheduled for  this in the near future.  Hospital Course:  RAYCHELL HOLCOMB is a 69 y.o. female is S/P Right Procedure(s): AMPUTATION ABOVE KNEE POD# 1 Dressing intact without drainage. POD# 2 Incision clean and dry without drainage, no erythema viable AKA.  She was discharged home and will go to out patient PT/OT.  Disposition stable.    Significant Diagnostic Studies: CBC Lab Results  Component Value Date   WBC 9.4 12/20/2014   HGB 9.7* 12/20/2014   HCT 31.5* 12/20/2014   MCV 92.4 12/20/2014   PLT 264 12/20/2014    BMET    Component Value Date/Time   NA 136 12/20/2014 0300   K 4.4 12/20/2014 0300   CL 101 12/20/2014 0300   CO2 26 12/20/2014 0300   GLUCOSE 131* 12/20/2014 0300   BUN 9 12/20/2014 0300   CREATININE 0.58 12/20/2014 0300   CALCIUM 8.2* 12/20/2014 0300   GFRNONAA >60 12/20/2014 0300   GFRAA >60 12/20/2014 0300   COAG Lab Results  Component Value Date   INR 1.7* 08/09/2008   INR 1.5 08/09/2008   INR 1.0 08/09/2008     Disposition:  Discharge to :Home Discharge Instructions    Call MD for:  redness, tenderness, or signs of infection (pain, swelling, bleeding, redness, odor or green/yellow discharge around incision site)    Complete by:  As directed      Call MD for:  severe or increased pain, loss or decreased feeling  in affected limb(s)    Complete by:  As directed      Call MD for:  temperature >100.5    Complete by:  As directed      Discharge instructions    Complete by:  As directed   You need to shower with soap and water daily keep the right leg incision clean.     Increase activity slowly    Complete by:  As directed   Walk with assistance use walker or cane as needed     Resume previous diet    Complete by:  As directed             Medication List    TAKE these medications        amoxicillin-clavulanate 875-125 MG tablet  Commonly known as:  AUGMENTIN  Take 1 tablet by mouth 2 (two) times daily.     aspirin EC 81 MG tablet  Take 81 mg by  mouth daily as needed (pain).     doxycycline 100 MG tablet  Commonly known as:  VIBRA-TABS  Take 100 mg by mouth 2 (two) times daily.     furosemide 20 MG tablet  Commonly known as:  LASIX  Take 20 mg by mouth 3 (three) times daily.     gabapentin 400 MG capsule  Commonly known as:  NEURONTIN  Take 400 mg by mouth 3 (three) times daily.     KLOR-CON M20 20 MEQ tablet  Generic drug:  potassium chloride SA  Take 20 mEq by mouth 2 (two) times daily.     meloxicam 7.5 MG tablet  Commonly known as:  MOBIC  Take 7.5 mg by mouth 2 (two) times daily as needed for pain.     oxyCODONE-acetaminophen 5-325 MG tablet  Commonly known as:  PERCOCET/ROXICET  Take 1 tablet by mouth every 4 (four) hours as needed for moderate pain.       Verbal and written Discharge instructions given to the patient. Wound care per Discharge AVS     Follow-up Information    Follow up with [facility].   Why:  tub bench arranged- to be delivered to room prior to dischage   Contact information:   [address] [place] [address] [phone]       Follow up with outpt PT/OT.   Why:  will be given prescription for PT/OT to arrange in [place] on own      Signed: Roxy Horseman 12/24/2014, 8:44 AM

## 2015-01-04 DIAGNOSIS — R600 Localized edema: Secondary | ICD-10-CM | POA: Diagnosis not present

## 2015-01-04 DIAGNOSIS — M159 Polyosteoarthritis, unspecified: Secondary | ICD-10-CM | POA: Diagnosis not present

## 2015-01-04 DIAGNOSIS — E876 Hypokalemia: Secondary | ICD-10-CM | POA: Diagnosis not present

## 2015-01-04 DIAGNOSIS — I739 Peripheral vascular disease, unspecified: Secondary | ICD-10-CM | POA: Diagnosis not present

## 2015-01-04 DIAGNOSIS — J449 Chronic obstructive pulmonary disease, unspecified: Secondary | ICD-10-CM | POA: Diagnosis not present

## 2015-01-04 DIAGNOSIS — Z23 Encounter for immunization: Secondary | ICD-10-CM | POA: Diagnosis not present

## 2015-01-04 DIAGNOSIS — E7800 Pure hypercholesterolemia, unspecified: Secondary | ICD-10-CM | POA: Diagnosis not present

## 2015-01-04 DIAGNOSIS — C140 Malignant neoplasm of pharynx, unspecified: Secondary | ICD-10-CM | POA: Diagnosis not present

## 2015-01-04 DIAGNOSIS — G629 Polyneuropathy, unspecified: Secondary | ICD-10-CM | POA: Diagnosis not present

## 2015-01-04 DIAGNOSIS — I10 Essential (primary) hypertension: Secondary | ICD-10-CM | POA: Diagnosis not present

## 2015-01-04 DIAGNOSIS — Z720 Tobacco use: Secondary | ICD-10-CM | POA: Diagnosis not present

## 2015-01-04 DIAGNOSIS — Z89611 Acquired absence of right leg above knee: Secondary | ICD-10-CM | POA: Diagnosis not present

## 2015-01-15 ENCOUNTER — Encounter: Payer: Self-pay | Admitting: Vascular Surgery

## 2015-01-17 ENCOUNTER — Encounter: Payer: Self-pay | Admitting: Family

## 2015-01-17 ENCOUNTER — Ambulatory Visit (INDEPENDENT_AMBULATORY_CARE_PROVIDER_SITE_OTHER): Payer: Medicare Other | Admitting: Family

## 2015-01-17 VITALS — BP 142/81 | HR 90 | Temp 98.8°F | Resp 16 | Ht 62.5 in | Wt <= 1120 oz

## 2015-01-17 DIAGNOSIS — Z720 Tobacco use: Secondary | ICD-10-CM

## 2015-01-17 DIAGNOSIS — F17200 Nicotine dependence, unspecified, uncomplicated: Secondary | ICD-10-CM

## 2015-01-17 DIAGNOSIS — I739 Peripheral vascular disease, unspecified: Secondary | ICD-10-CM

## 2015-01-17 DIAGNOSIS — Z4802 Encounter for removal of sutures: Secondary | ICD-10-CM

## 2015-01-17 DIAGNOSIS — Z89611 Acquired absence of right leg above knee: Secondary | ICD-10-CM

## 2015-01-17 NOTE — Progress Notes (Signed)
    Postoperative Visit   History of Present Illness  Heather Jimenez is a 69 y.o. year old female patient of Dr. Oneida Alar who is s/p Right AKA on 12-18-14.  She returns today for staple removal.   She is known to Dr. Oneida Alar from having undergone prior aortobifemoral bypass and right femoral popliteal bypass in 2010.  At her 12/13/14 visit she had not been seen in the office since 2011. She is no longer able to speak following laryngectomy for throat cancer. She is able to mouth words and her history is aided by her husband.  Unfortunately she still smokes, but has decreased to less than 1/2 ppd. She denies any non healing wounds or problems with her left leg. She is able to bake, take care of herself, stand on her left leg.  The patient's wounds are healed.  The patient is able to complete their activities of daily living.  The patient's current symptoms are: none.  For VQI Use Only  PRE-ADM LIVING: Home  AMB STATUS: Wheelchair  Physical Examination  Filed Vitals:   01/17/15 1151  BP: 142/81  Pulse: 90  Temp: 98.8 F (37.1 C)  TempSrc: Oral  Resp: 16  Height: 5' 2.5" (1.588 m)  Weight: 69 lb (31.298 kg)  SpO2: 98%   Body mass index is 12.41 kg/(m^2).   Right AKA incision is healed, no drainage, incision edges are well proximated, minimal erythema at 1 cm area of incision, no swelling. Pt denies pain.  Medical Decision Making  Heather Jimenez is a 69 y.o. year old female who presents s/p Right AKA on 12-18-14.  She returns today for staple removal.  .  The patient's bypass incisions are  healing appropriately with resolution of pre-operative symptoms.  All staples removed and steri strips applied.  [employer] will be contacted to discuss possible prosthesis fitting with pt's husband and pt. I discussed in depth with the patient the nature of atherosclerosis, and emphasized the importance of maximal medical management including strict control of blood pressure, blood  glucose, and lipid levels, obtaining regular exercise, and cessation of smoking.  The patient is aware that without maximal medical management the underlying atherosclerotic disease process will progress, limiting the benefit of any interventions. The patient's surveillance will included left ABI  which will be completed in: 3 months, at which time the patient will be re-evaluated.   I emphasized the importance of routine surveillance of the patient's bypass, as the vascular surgery literature emphasize the improved patency possible with assisted primary patency procedures versus secondary patency procedures. The patient agrees to participate in their maximal medical care and routine surveillance.  Thank you for allowing [place] to participate in this patient's care.  NICKEL, Sharmon Leyden, RN, MSN, FNP-C [facility] [place] Office: [phone]  01/17/2015, 12:06 PM  Clinic MD: Oneida Alar

## 2015-02-05 DIAGNOSIS — C139 Malignant neoplasm of hypopharynx, unspecified: Secondary | ICD-10-CM | POA: Diagnosis not present

## 2015-02-05 DIAGNOSIS — Z85819 Personal history of malignant neoplasm of unspecified site of lip, oral cavity, and pharynx: Secondary | ICD-10-CM | POA: Diagnosis not present

## 2015-02-15 DIAGNOSIS — Z89611 Acquired absence of right leg above knee: Secondary | ICD-10-CM | POA: Diagnosis not present

## 2015-02-15 DIAGNOSIS — C140 Malignant neoplasm of pharynx, unspecified: Secondary | ICD-10-CM | POA: Diagnosis not present

## 2015-02-15 DIAGNOSIS — M159 Polyosteoarthritis, unspecified: Secondary | ICD-10-CM | POA: Diagnosis not present

## 2015-02-15 DIAGNOSIS — E876 Hypokalemia: Secondary | ICD-10-CM | POA: Diagnosis not present

## 2015-02-15 DIAGNOSIS — R636 Underweight: Secondary | ICD-10-CM | POA: Diagnosis not present

## 2015-02-15 DIAGNOSIS — Z720 Tobacco use: Secondary | ICD-10-CM | POA: Diagnosis not present

## 2015-02-15 DIAGNOSIS — I739 Peripheral vascular disease, unspecified: Secondary | ICD-10-CM | POA: Diagnosis not present

## 2015-02-15 DIAGNOSIS — G629 Polyneuropathy, unspecified: Secondary | ICD-10-CM | POA: Diagnosis not present

## 2015-02-15 DIAGNOSIS — J449 Chronic obstructive pulmonary disease, unspecified: Secondary | ICD-10-CM | POA: Diagnosis not present

## 2015-02-15 DIAGNOSIS — I10 Essential (primary) hypertension: Secondary | ICD-10-CM | POA: Diagnosis not present

## 2015-02-15 DIAGNOSIS — E7800 Pure hypercholesterolemia, unspecified: Secondary | ICD-10-CM | POA: Diagnosis not present

## 2015-02-15 DIAGNOSIS — R600 Localized edema: Secondary | ICD-10-CM | POA: Diagnosis not present

## 2015-02-19 DIAGNOSIS — C139 Malignant neoplasm of hypopharynx, unspecified: Secondary | ICD-10-CM | POA: Diagnosis not present

## 2015-02-21 DIAGNOSIS — C139 Malignant neoplasm of hypopharynx, unspecified: Secondary | ICD-10-CM | POA: Diagnosis not present

## 2015-02-21 DIAGNOSIS — C760 Malignant neoplasm of head, face and neck: Secondary | ICD-10-CM | POA: Diagnosis not present

## 2015-02-21 DIAGNOSIS — R638 Other symptoms and signs concerning food and fluid intake: Secondary | ICD-10-CM | POA: Diagnosis not present

## 2015-02-21 DIAGNOSIS — R1312 Dysphagia, oropharyngeal phase: Secondary | ICD-10-CM | POA: Diagnosis not present

## 2015-02-25 DIAGNOSIS — R638 Other symptoms and signs concerning food and fluid intake: Secondary | ICD-10-CM | POA: Insufficient documentation

## 2015-03-29 DIAGNOSIS — R490 Dysphonia: Secondary | ICD-10-CM | POA: Diagnosis not present

## 2015-04-22 DIAGNOSIS — G629 Polyneuropathy, unspecified: Secondary | ICD-10-CM | POA: Diagnosis not present

## 2015-04-22 DIAGNOSIS — I10 Essential (primary) hypertension: Secondary | ICD-10-CM | POA: Diagnosis not present

## 2015-04-22 DIAGNOSIS — M159 Polyosteoarthritis, unspecified: Secondary | ICD-10-CM | POA: Diagnosis not present

## 2015-04-22 DIAGNOSIS — I739 Peripheral vascular disease, unspecified: Secondary | ICD-10-CM | POA: Diagnosis not present

## 2015-04-22 DIAGNOSIS — E876 Hypokalemia: Secondary | ICD-10-CM | POA: Diagnosis not present

## 2015-04-22 DIAGNOSIS — R600 Localized edema: Secondary | ICD-10-CM | POA: Diagnosis not present

## 2015-04-22 DIAGNOSIS — Z720 Tobacco use: Secondary | ICD-10-CM | POA: Diagnosis not present

## 2015-04-22 DIAGNOSIS — Z89611 Acquired absence of right leg above knee: Secondary | ICD-10-CM | POA: Diagnosis not present

## 2015-04-22 DIAGNOSIS — R636 Underweight: Secondary | ICD-10-CM | POA: Diagnosis not present

## 2015-04-22 DIAGNOSIS — J449 Chronic obstructive pulmonary disease, unspecified: Secondary | ICD-10-CM | POA: Diagnosis not present

## 2015-04-22 DIAGNOSIS — C140 Malignant neoplasm of pharynx, unspecified: Secondary | ICD-10-CM | POA: Diagnosis not present

## 2015-04-22 DIAGNOSIS — E7800 Pure hypercholesterolemia, unspecified: Secondary | ICD-10-CM | POA: Diagnosis not present

## 2015-04-24 ENCOUNTER — Telehealth: Payer: Self-pay

## 2015-04-24 NOTE — Telephone Encounter (Signed)
Heather Jimenez from [place]. Called 04-22-14, needs new 6 month Gait Training Rx.  Pt Had seen NP-Suzanne 01-17-15, S/P Right  AKA 12-18-14.  Rx was faxed to [facility] at [place]. 04-23-15

## 2015-04-25 ENCOUNTER — Encounter: Payer: Self-pay | Admitting: Family

## 2015-04-30 DIAGNOSIS — R490 Dysphonia: Secondary | ICD-10-CM | POA: Diagnosis not present

## 2015-05-01 DIAGNOSIS — R269 Unspecified abnormalities of gait and mobility: Secondary | ICD-10-CM | POA: Diagnosis not present

## 2015-05-01 DIAGNOSIS — Z89619 Acquired absence of unspecified leg above knee: Secondary | ICD-10-CM | POA: Diagnosis not present

## 2015-05-01 DIAGNOSIS — G547 Phantom limb syndrome without pain: Secondary | ICD-10-CM | POA: Diagnosis not present

## 2015-05-01 DIAGNOSIS — M6281 Muscle weakness (generalized): Secondary | ICD-10-CM | POA: Diagnosis not present

## 2015-05-01 DIAGNOSIS — R600 Localized edema: Secondary | ICD-10-CM | POA: Diagnosis not present

## 2015-05-01 DIAGNOSIS — M25651 Stiffness of right hip, not elsewhere classified: Secondary | ICD-10-CM | POA: Diagnosis not present

## 2015-05-01 DIAGNOSIS — R262 Difficulty in walking, not elsewhere classified: Secondary | ICD-10-CM | POA: Diagnosis not present

## 2015-05-02 ENCOUNTER — Ambulatory Visit (HOSPITAL_COMMUNITY)
Admission: RE | Admit: 2015-05-02 | Discharge: 2015-05-02 | Disposition: A | Discharge status: Home / Self Care | Payer: Medicare Other | Source: Ambulatory Visit | Attending: Family | Admitting: Family

## 2015-05-02 ENCOUNTER — Ambulatory Visit (INDEPENDENT_AMBULATORY_CARE_PROVIDER_SITE_OTHER): Payer: Medicare Other | Admitting: Family

## 2015-05-02 VITALS — BP 140/80 | HR 84 | Temp 97.0°F | Resp 14 | Ht 63.0 in | Wt 80.0 lb

## 2015-05-02 DIAGNOSIS — E785 Hyperlipidemia, unspecified: Secondary | ICD-10-CM | POA: Diagnosis not present

## 2015-05-02 DIAGNOSIS — Z720 Tobacco use: Secondary | ICD-10-CM

## 2015-05-02 DIAGNOSIS — Z89611 Acquired absence of right leg above knee: Secondary | ICD-10-CM

## 2015-05-02 DIAGNOSIS — Z4889 Encounter for other specified surgical aftercare: Secondary | ICD-10-CM

## 2015-05-02 DIAGNOSIS — R0989 Other specified symptoms and signs involving the circulatory and respiratory systems: Secondary | ICD-10-CM | POA: Insufficient documentation

## 2015-05-02 DIAGNOSIS — F17200 Nicotine dependence, unspecified, uncomplicated: Secondary | ICD-10-CM

## 2015-05-02 DIAGNOSIS — R938 Abnormal findings on diagnostic imaging of other specified body structures: Secondary | ICD-10-CM | POA: Diagnosis not present

## 2015-05-02 DIAGNOSIS — I739 Peripheral vascular disease, unspecified: Secondary | ICD-10-CM

## 2015-05-02 DIAGNOSIS — Z48812 Encounter for surgical aftercare following surgery on the circulatory system: Secondary | ICD-10-CM

## 2015-05-02 NOTE — Progress Notes (Signed)
[facility] [place] HISTORY AND PHYSICAL -PAD  History of Present Illness Heather Jimenez is a 70 y.o. female patient of Dr. Oneida Alar who is s/p Right AKA on 12-18-14.   She is known to Dr. Oneida Alar from having undergone prior aortobifemoral bypass and right femoral popliteal bypass in 2010.  At her 12/13/14 visit she had not been seen in the office since 2011. She is no longer able to speak following laryngectomy for throat cancer. She uses an amplifier to speak and her history is aided by her husband.  Unfortunately she still smokes, but has decreased to less than 1/3 ppd. She denies any non healing wounds or problems with her left leg. She is being fitted for a right AKA prosthesis from [employer]. She is receiving OT twice/week. She is able to bake, take care of herself, stand on her left leg.  She will be having a filter placed over her trach.   Pt Diabetic: No Pt smoker: smoker  (1/3 ppd, started at age 43 yrs)  Pt meds include: Statin :No Betablocker: No ASA: Yes Other anticoagulants/antiplatelets: no  Past Medical History  Diagnosis Date  . PAD (peripheral artery disease) ([place])   . Dyslipidemia   . Carotid bruit   . Tobacco dependence   . Throat cancer ([place])   . Pneumonia     Social History Social History  Substance Use Topics  . Smoking status: Light Tobacco Smoker -- 1.00 packs/day  . Smokeless tobacco: Never Used  . Alcohol Use: No    Family History Family History  Problem Relation Age of Onset  . Cancer Mother   . Lung cancer Brother     Past Surgical History  Procedure Laterality Date  . Aorta - bilateral femoral artery bypass graft    . Tonsillectomy    . Total laryngectomy    . Peg tube placement    . Peg tube removal    . Peripheral vascular catheterization N/A 12/14/2014    Procedure: Abdominal Aortogram;  Surgeon: Elam Dutch, MD;  Location: [facility];  Service: Cardiovascular;  Laterality: N/A;  . Lower  extremity angiogram Bilateral 12/14/2014    Procedure: Lower Extremity Angiogram;  Surgeon: Elam Dutch, MD;  Location: [facility];  Service: Cardiovascular;  Laterality: Bilateral;  . Colonoscopy    . Amputation Right 12/18/2014    Procedure: AMPUTATION ABOVE KNEE;  Surgeon: Elam Dutch, MD;  Location: [facility] OR;  Service: Vascular;  Laterality: Right;    No Known Allergies  Current Outpatient Prescriptions  Medication Sig Dispense Refill  . aspirin EC 81 MG tablet Take 81 mg by mouth daily as needed (pain).    Marland Kitchen doxycycline (VIBRA-TABS) 100 MG tablet Take 100 mg by mouth 2 (two) times daily.     Marland Kitchen gabapentin (NEURONTIN) 400 MG capsule Take 400 mg by mouth 3 (three) times daily. Reported on 05/02/2015  12  . KLOR-CON M20 20 MEQ tablet Take 20 mEq by mouth 2 (two) times daily.     Marland Kitchen lisinopril (PRINIVIL,ZESTRIL) 20 MG tablet TK 1 T PO D  12  . meloxicam (MOBIC) 15 MG tablet     . furosemide (LASIX) 20 MG tablet Take 20 mg by mouth 3 (three) times daily. Reported on 05/02/2015  5  . sulfamethoxazole-trimethoprim (BACTRIM DS,SEPTRA DS) 800-160 MG tablet Take 1 tablet by mouth 2 (two) times daily. Reported on 05/02/2015  0   No current facility-administered medications for this visit.  ROS: See HPI for pertinent positives and negatives.   Physical Examination  Filed Vitals:   05/02/15 1100  BP: 140/80  Pulse: 84  Temp: 97 F (36.1 C)  TempSrc: Oral  Resp: 14  Height: '5\' 3"'$  (1.6 m)  Weight: 80 lb (36.288 kg)  SpO2: 98%   Body mass index is 14.18 kg/(m^2).  General: A&O x 3, WDWN, thin female. Gait: seated in her wheelchair Eyes: PERRLA. Pulmonary: respirations are non labored, tracheotomy covered by a protective cloth kept in place with ties around her neck. Cardiac: regular rhythm, no detected murmur.         Carotid Bruits Right Left   Negative Negative  Aorta is not palpable. Radial pulses: radial pulses not palpable; left ulnar pulse is 1+ palpable,  right brachial pulse is 1+ palpable                           VASCULAR EXAM: Extremities without ischemic changes, without Gangrene; without open wounds. Right AKA stump is well healed, no ulcers.                                                                                                           LE Pulses Right Left       FEMORAL  not palpable  2+ palpable        POPLITEAL  AKA   not palpable       POSTERIOR TIBIAL  AKAe   not palpable        DORSALIS PEDIS      ANTERIOR TIBIAL AKA  not palpable    Abdomen: soft, NT, no palpable masses. Skin: no rashes, see Extremities. Musculoskeletal: no muscle wasting or atrophy.  Neurologic: A&O X 3; Appropriate Affect ; SENSATION: normal; MOTOR FUNCTION:  moving all extremities equally, motor strength 5/5 throughout.  Speech is made through an amplification device she wears at her waist on her pants, CN 2-12 intact.    Non-Invasive Vascular Imaging: DATE: 05/02/2015 ABI (Date: 05/03/2015)  R: AKA  L: 0.79 (attempted previously, but pt unable to tolerate), DP: absent, PT: monophasic, TBI: 0.51   ASSESSMENT: Heather Jimenez is a 70 y.o. female who is s/p right AKA on 12-18-14. Her right AKA stump is well healed, she is working with [employer] for fitting a prosthesis.  She walks a great deal with her walker, has no claudication in her left leg. She has no signs of ischemia in her left foot/leg, her stump has no signs of pressure ulcers, no wounds.  Left ABI indicates moderate arterial occlusive disease, absent DP waveform, monophasic PT waveform.   PLAN:  The patient was counseled re smoking cessation and given several free resources re smoking cessation. Continue graduated walking program.   Based on the patient's vascular studies and examination, pt will return to clinic in 3 months with left ABI.   I discussed in depth with the patient the nature of atherosclerosis, and emphasized the importance of maximal medical management  including strict control of  blood pressure, blood glucose, and lipid levels, obtaining regular exercise, and cessation of smoking.  The patient is aware that without maximal medical management the underlying atherosclerotic disease process will progress, limiting the benefit of any interventions.  The patient was given information about PAD including signs, symptoms, treatment, what symptoms should prompt the patient to seek immediate medical care, and risk reduction measures to take.  Clemon Chambers, RN, MSN, FNP-C [facility] [employer] Phone: [phone]  Clinic MD: [facility]  05/02/2015 11:25 AM

## 2015-05-02 NOTE — Patient Instructions (Addendum)

## 2015-05-03 ENCOUNTER — Encounter: Payer: Self-pay | Admitting: Family

## 2015-05-03 NOTE — Addendum Note (Signed)
Addended by: Dorthula Rue L on: 05/03/2015 10:12 AM   Modules accepted: Orders

## 2015-05-06 DIAGNOSIS — I10 Essential (primary) hypertension: Secondary | ICD-10-CM | POA: Diagnosis not present

## 2015-05-06 DIAGNOSIS — Z720 Tobacco use: Secondary | ICD-10-CM | POA: Diagnosis not present

## 2015-05-06 DIAGNOSIS — I739 Peripheral vascular disease, unspecified: Secondary | ICD-10-CM | POA: Diagnosis not present

## 2015-05-06 DIAGNOSIS — C140 Malignant neoplasm of pharynx, unspecified: Secondary | ICD-10-CM | POA: Diagnosis not present

## 2015-05-06 DIAGNOSIS — J449 Chronic obstructive pulmonary disease, unspecified: Secondary | ICD-10-CM | POA: Diagnosis not present

## 2015-05-06 DIAGNOSIS — G629 Polyneuropathy, unspecified: Secondary | ICD-10-CM | POA: Diagnosis not present

## 2015-05-06 DIAGNOSIS — M159 Polyosteoarthritis, unspecified: Secondary | ICD-10-CM | POA: Diagnosis not present

## 2015-05-06 DIAGNOSIS — E876 Hypokalemia: Secondary | ICD-10-CM | POA: Diagnosis not present

## 2015-05-06 DIAGNOSIS — E7800 Pure hypercholesterolemia, unspecified: Secondary | ICD-10-CM | POA: Diagnosis not present

## 2015-05-06 DIAGNOSIS — R600 Localized edema: Secondary | ICD-10-CM | POA: Diagnosis not present

## 2015-05-06 DIAGNOSIS — R636 Underweight: Secondary | ICD-10-CM | POA: Diagnosis not present

## 2015-05-06 DIAGNOSIS — Z89611 Acquired absence of right leg above knee: Secondary | ICD-10-CM | POA: Diagnosis not present

## 2015-05-15 DIAGNOSIS — R600 Localized edema: Secondary | ICD-10-CM | POA: Diagnosis not present

## 2015-05-15 DIAGNOSIS — R269 Unspecified abnormalities of gait and mobility: Secondary | ICD-10-CM | POA: Diagnosis not present

## 2015-05-15 DIAGNOSIS — G547 Phantom limb syndrome without pain: Secondary | ICD-10-CM | POA: Diagnosis not present

## 2015-05-15 DIAGNOSIS — R262 Difficulty in walking, not elsewhere classified: Secondary | ICD-10-CM | POA: Diagnosis not present

## 2015-05-15 DIAGNOSIS — Z89619 Acquired absence of unspecified leg above knee: Secondary | ICD-10-CM | POA: Diagnosis not present

## 2015-05-15 DIAGNOSIS — M25651 Stiffness of right hip, not elsewhere classified: Secondary | ICD-10-CM | POA: Diagnosis not present

## 2015-05-15 DIAGNOSIS — M6281 Muscle weakness (generalized): Secondary | ICD-10-CM | POA: Diagnosis not present

## 2015-05-17 DIAGNOSIS — M6281 Muscle weakness (generalized): Secondary | ICD-10-CM | POA: Diagnosis not present

## 2015-05-17 DIAGNOSIS — M25651 Stiffness of right hip, not elsewhere classified: Secondary | ICD-10-CM | POA: Diagnosis not present

## 2015-05-17 DIAGNOSIS — R262 Difficulty in walking, not elsewhere classified: Secondary | ICD-10-CM | POA: Diagnosis not present

## 2015-05-17 DIAGNOSIS — Z89619 Acquired absence of unspecified leg above knee: Secondary | ICD-10-CM | POA: Diagnosis not present

## 2015-05-17 DIAGNOSIS — R269 Unspecified abnormalities of gait and mobility: Secondary | ICD-10-CM | POA: Diagnosis not present

## 2015-05-17 DIAGNOSIS — G547 Phantom limb syndrome without pain: Secondary | ICD-10-CM | POA: Diagnosis not present

## 2015-05-20 DIAGNOSIS — C140 Malignant neoplasm of pharynx, unspecified: Secondary | ICD-10-CM | POA: Diagnosis not present

## 2015-05-20 DIAGNOSIS — I739 Peripheral vascular disease, unspecified: Secondary | ICD-10-CM | POA: Diagnosis not present

## 2015-05-20 DIAGNOSIS — Z720 Tobacco use: Secondary | ICD-10-CM | POA: Diagnosis not present

## 2015-05-20 DIAGNOSIS — I10 Essential (primary) hypertension: Secondary | ICD-10-CM | POA: Diagnosis not present

## 2015-05-20 DIAGNOSIS — M159 Polyosteoarthritis, unspecified: Secondary | ICD-10-CM | POA: Diagnosis not present

## 2015-05-20 DIAGNOSIS — G629 Polyneuropathy, unspecified: Secondary | ICD-10-CM | POA: Diagnosis not present

## 2015-05-20 DIAGNOSIS — E7800 Pure hypercholesterolemia, unspecified: Secondary | ICD-10-CM | POA: Diagnosis not present

## 2015-05-20 DIAGNOSIS — E876 Hypokalemia: Secondary | ICD-10-CM | POA: Diagnosis not present

## 2015-05-20 DIAGNOSIS — R600 Localized edema: Secondary | ICD-10-CM | POA: Diagnosis not present

## 2015-05-20 DIAGNOSIS — R636 Underweight: Secondary | ICD-10-CM | POA: Diagnosis not present

## 2015-05-20 DIAGNOSIS — Z89611 Acquired absence of right leg above knee: Secondary | ICD-10-CM | POA: Diagnosis not present

## 2015-05-20 DIAGNOSIS — J449 Chronic obstructive pulmonary disease, unspecified: Secondary | ICD-10-CM | POA: Diagnosis not present

## 2015-05-22 DIAGNOSIS — Z89619 Acquired absence of unspecified leg above knee: Secondary | ICD-10-CM | POA: Diagnosis not present

## 2015-05-22 DIAGNOSIS — M6281 Muscle weakness (generalized): Secondary | ICD-10-CM | POA: Diagnosis not present

## 2015-05-22 DIAGNOSIS — R269 Unspecified abnormalities of gait and mobility: Secondary | ICD-10-CM | POA: Diagnosis not present

## 2015-05-22 DIAGNOSIS — G547 Phantom limb syndrome without pain: Secondary | ICD-10-CM | POA: Diagnosis not present

## 2015-05-22 DIAGNOSIS — R262 Difficulty in walking, not elsewhere classified: Secondary | ICD-10-CM | POA: Diagnosis not present

## 2015-05-22 DIAGNOSIS — M25651 Stiffness of right hip, not elsewhere classified: Secondary | ICD-10-CM | POA: Diagnosis not present

## 2015-05-24 DIAGNOSIS — M25651 Stiffness of right hip, not elsewhere classified: Secondary | ICD-10-CM | POA: Diagnosis not present

## 2015-05-24 DIAGNOSIS — R262 Difficulty in walking, not elsewhere classified: Secondary | ICD-10-CM | POA: Diagnosis not present

## 2015-05-24 DIAGNOSIS — M6281 Muscle weakness (generalized): Secondary | ICD-10-CM | POA: Diagnosis not present

## 2015-05-24 DIAGNOSIS — R269 Unspecified abnormalities of gait and mobility: Secondary | ICD-10-CM | POA: Diagnosis not present

## 2015-05-24 DIAGNOSIS — Z89619 Acquired absence of unspecified leg above knee: Secondary | ICD-10-CM | POA: Diagnosis not present

## 2015-05-24 DIAGNOSIS — G547 Phantom limb syndrome without pain: Secondary | ICD-10-CM | POA: Diagnosis not present

## 2015-05-29 DIAGNOSIS — G547 Phantom limb syndrome without pain: Secondary | ICD-10-CM | POA: Diagnosis not present

## 2015-05-29 DIAGNOSIS — R262 Difficulty in walking, not elsewhere classified: Secondary | ICD-10-CM | POA: Diagnosis not present

## 2015-05-29 DIAGNOSIS — R269 Unspecified abnormalities of gait and mobility: Secondary | ICD-10-CM | POA: Diagnosis not present

## 2015-05-29 DIAGNOSIS — Z89619 Acquired absence of unspecified leg above knee: Secondary | ICD-10-CM | POA: Diagnosis not present

## 2015-05-29 DIAGNOSIS — M6281 Muscle weakness (generalized): Secondary | ICD-10-CM | POA: Diagnosis not present

## 2015-05-29 DIAGNOSIS — M25651 Stiffness of right hip, not elsewhere classified: Secondary | ICD-10-CM | POA: Diagnosis not present

## 2015-06-05 DIAGNOSIS — Z89619 Acquired absence of unspecified leg above knee: Secondary | ICD-10-CM | POA: Diagnosis not present

## 2015-06-05 DIAGNOSIS — R269 Unspecified abnormalities of gait and mobility: Secondary | ICD-10-CM | POA: Diagnosis not present

## 2015-06-05 DIAGNOSIS — M6281 Muscle weakness (generalized): Secondary | ICD-10-CM | POA: Diagnosis not present

## 2015-06-05 DIAGNOSIS — M25651 Stiffness of right hip, not elsewhere classified: Secondary | ICD-10-CM | POA: Diagnosis not present

## 2015-06-05 DIAGNOSIS — R262 Difficulty in walking, not elsewhere classified: Secondary | ICD-10-CM | POA: Diagnosis not present

## 2015-06-05 DIAGNOSIS — G547 Phantom limb syndrome without pain: Secondary | ICD-10-CM | POA: Diagnosis not present

## 2015-06-07 DIAGNOSIS — M25651 Stiffness of right hip, not elsewhere classified: Secondary | ICD-10-CM | POA: Diagnosis not present

## 2015-06-07 DIAGNOSIS — R269 Unspecified abnormalities of gait and mobility: Secondary | ICD-10-CM | POA: Diagnosis not present

## 2015-06-07 DIAGNOSIS — G547 Phantom limb syndrome without pain: Secondary | ICD-10-CM | POA: Diagnosis not present

## 2015-06-07 DIAGNOSIS — Z89619 Acquired absence of unspecified leg above knee: Secondary | ICD-10-CM | POA: Diagnosis not present

## 2015-06-07 DIAGNOSIS — M6281 Muscle weakness (generalized): Secondary | ICD-10-CM | POA: Diagnosis not present

## 2015-06-07 DIAGNOSIS — R262 Difficulty in walking, not elsewhere classified: Secondary | ICD-10-CM | POA: Diagnosis not present

## 2015-06-12 DIAGNOSIS — M6281 Muscle weakness (generalized): Secondary | ICD-10-CM | POA: Diagnosis not present

## 2015-06-12 DIAGNOSIS — G547 Phantom limb syndrome without pain: Secondary | ICD-10-CM | POA: Diagnosis not present

## 2015-06-12 DIAGNOSIS — R269 Unspecified abnormalities of gait and mobility: Secondary | ICD-10-CM | POA: Diagnosis not present

## 2015-06-12 DIAGNOSIS — Z89619 Acquired absence of unspecified leg above knee: Secondary | ICD-10-CM | POA: Diagnosis not present

## 2015-06-12 DIAGNOSIS — R262 Difficulty in walking, not elsewhere classified: Secondary | ICD-10-CM | POA: Diagnosis not present

## 2015-06-12 DIAGNOSIS — M25651 Stiffness of right hip, not elsewhere classified: Secondary | ICD-10-CM | POA: Diagnosis not present

## 2015-06-14 DIAGNOSIS — M6281 Muscle weakness (generalized): Secondary | ICD-10-CM | POA: Diagnosis not present

## 2015-06-14 DIAGNOSIS — G547 Phantom limb syndrome without pain: Secondary | ICD-10-CM | POA: Diagnosis not present

## 2015-06-14 DIAGNOSIS — R269 Unspecified abnormalities of gait and mobility: Secondary | ICD-10-CM | POA: Diagnosis not present

## 2015-06-14 DIAGNOSIS — M25651 Stiffness of right hip, not elsewhere classified: Secondary | ICD-10-CM | POA: Diagnosis not present

## 2015-06-14 DIAGNOSIS — R262 Difficulty in walking, not elsewhere classified: Secondary | ICD-10-CM | POA: Diagnosis not present

## 2015-06-14 DIAGNOSIS — Z89619 Acquired absence of unspecified leg above knee: Secondary | ICD-10-CM | POA: Diagnosis not present

## 2015-06-21 DIAGNOSIS — M6281 Muscle weakness (generalized): Secondary | ICD-10-CM | POA: Diagnosis not present

## 2015-06-21 DIAGNOSIS — G547 Phantom limb syndrome without pain: Secondary | ICD-10-CM | POA: Diagnosis not present

## 2015-06-21 DIAGNOSIS — Z89611 Acquired absence of right leg above knee: Secondary | ICD-10-CM | POA: Diagnosis not present

## 2015-06-21 DIAGNOSIS — R600 Localized edema: Secondary | ICD-10-CM | POA: Diagnosis not present

## 2015-06-21 DIAGNOSIS — R262 Difficulty in walking, not elsewhere classified: Secondary | ICD-10-CM | POA: Diagnosis not present

## 2015-06-21 DIAGNOSIS — M25651 Stiffness of right hip, not elsewhere classified: Secondary | ICD-10-CM | POA: Diagnosis not present

## 2015-06-21 DIAGNOSIS — R269 Unspecified abnormalities of gait and mobility: Secondary | ICD-10-CM | POA: Diagnosis not present

## 2015-06-24 DIAGNOSIS — I739 Peripheral vascular disease, unspecified: Secondary | ICD-10-CM | POA: Diagnosis not present

## 2015-06-24 DIAGNOSIS — E7800 Pure hypercholesterolemia, unspecified: Secondary | ICD-10-CM | POA: Diagnosis not present

## 2015-06-24 DIAGNOSIS — Z720 Tobacco use: Secondary | ICD-10-CM | POA: Diagnosis not present

## 2015-06-24 DIAGNOSIS — R636 Underweight: Secondary | ICD-10-CM | POA: Diagnosis not present

## 2015-06-24 DIAGNOSIS — J449 Chronic obstructive pulmonary disease, unspecified: Secondary | ICD-10-CM | POA: Diagnosis not present

## 2015-06-24 DIAGNOSIS — Z89611 Acquired absence of right leg above knee: Secondary | ICD-10-CM | POA: Diagnosis not present

## 2015-06-24 DIAGNOSIS — C140 Malignant neoplasm of pharynx, unspecified: Secondary | ICD-10-CM | POA: Diagnosis not present

## 2015-06-24 DIAGNOSIS — I10 Essential (primary) hypertension: Secondary | ICD-10-CM | POA: Diagnosis not present

## 2015-06-24 DIAGNOSIS — R600 Localized edema: Secondary | ICD-10-CM | POA: Diagnosis not present

## 2015-06-24 DIAGNOSIS — E876 Hypokalemia: Secondary | ICD-10-CM | POA: Diagnosis not present

## 2015-06-24 DIAGNOSIS — G629 Polyneuropathy, unspecified: Secondary | ICD-10-CM | POA: Diagnosis not present

## 2015-06-24 DIAGNOSIS — M159 Polyosteoarthritis, unspecified: Secondary | ICD-10-CM | POA: Diagnosis not present

## 2015-06-25 DIAGNOSIS — G547 Phantom limb syndrome without pain: Secondary | ICD-10-CM | POA: Diagnosis not present

## 2015-06-25 DIAGNOSIS — Z89611 Acquired absence of right leg above knee: Secondary | ICD-10-CM | POA: Diagnosis not present

## 2015-06-25 DIAGNOSIS — M6281 Muscle weakness (generalized): Secondary | ICD-10-CM | POA: Diagnosis not present

## 2015-06-25 DIAGNOSIS — R269 Unspecified abnormalities of gait and mobility: Secondary | ICD-10-CM | POA: Diagnosis not present

## 2015-06-25 DIAGNOSIS — R262 Difficulty in walking, not elsewhere classified: Secondary | ICD-10-CM | POA: Diagnosis not present

## 2015-06-25 DIAGNOSIS — M25651 Stiffness of right hip, not elsewhere classified: Secondary | ICD-10-CM | POA: Diagnosis not present

## 2015-06-27 DIAGNOSIS — G547 Phantom limb syndrome without pain: Secondary | ICD-10-CM | POA: Diagnosis not present

## 2015-06-27 DIAGNOSIS — R262 Difficulty in walking, not elsewhere classified: Secondary | ICD-10-CM | POA: Diagnosis not present

## 2015-06-27 DIAGNOSIS — M6281 Muscle weakness (generalized): Secondary | ICD-10-CM | POA: Diagnosis not present

## 2015-06-27 DIAGNOSIS — M25651 Stiffness of right hip, not elsewhere classified: Secondary | ICD-10-CM | POA: Diagnosis not present

## 2015-06-27 DIAGNOSIS — R269 Unspecified abnormalities of gait and mobility: Secondary | ICD-10-CM | POA: Diagnosis not present

## 2015-06-27 DIAGNOSIS — Z89611 Acquired absence of right leg above knee: Secondary | ICD-10-CM | POA: Diagnosis not present

## 2015-07-03 DIAGNOSIS — G547 Phantom limb syndrome without pain: Secondary | ICD-10-CM | POA: Diagnosis not present

## 2015-07-03 DIAGNOSIS — Z89611 Acquired absence of right leg above knee: Secondary | ICD-10-CM | POA: Diagnosis not present

## 2015-07-03 DIAGNOSIS — R262 Difficulty in walking, not elsewhere classified: Secondary | ICD-10-CM | POA: Diagnosis not present

## 2015-07-03 DIAGNOSIS — M6281 Muscle weakness (generalized): Secondary | ICD-10-CM | POA: Diagnosis not present

## 2015-07-03 DIAGNOSIS — M25651 Stiffness of right hip, not elsewhere classified: Secondary | ICD-10-CM | POA: Diagnosis not present

## 2015-07-03 DIAGNOSIS — R269 Unspecified abnormalities of gait and mobility: Secondary | ICD-10-CM | POA: Diagnosis not present

## 2015-07-05 DIAGNOSIS — M6281 Muscle weakness (generalized): Secondary | ICD-10-CM | POA: Diagnosis not present

## 2015-07-05 DIAGNOSIS — R269 Unspecified abnormalities of gait and mobility: Secondary | ICD-10-CM | POA: Diagnosis not present

## 2015-07-05 DIAGNOSIS — G547 Phantom limb syndrome without pain: Secondary | ICD-10-CM | POA: Diagnosis not present

## 2015-07-05 DIAGNOSIS — Z89611 Acquired absence of right leg above knee: Secondary | ICD-10-CM | POA: Diagnosis not present

## 2015-07-05 DIAGNOSIS — M25651 Stiffness of right hip, not elsewhere classified: Secondary | ICD-10-CM | POA: Diagnosis not present

## 2015-07-05 DIAGNOSIS — R262 Difficulty in walking, not elsewhere classified: Secondary | ICD-10-CM | POA: Diagnosis not present

## 2015-07-10 DIAGNOSIS — Z89611 Acquired absence of right leg above knee: Secondary | ICD-10-CM | POA: Diagnosis not present

## 2015-07-10 DIAGNOSIS — G547 Phantom limb syndrome without pain: Secondary | ICD-10-CM | POA: Diagnosis not present

## 2015-07-10 DIAGNOSIS — R262 Difficulty in walking, not elsewhere classified: Secondary | ICD-10-CM | POA: Diagnosis not present

## 2015-07-10 DIAGNOSIS — M25651 Stiffness of right hip, not elsewhere classified: Secondary | ICD-10-CM | POA: Diagnosis not present

## 2015-07-10 DIAGNOSIS — M6281 Muscle weakness (generalized): Secondary | ICD-10-CM | POA: Diagnosis not present

## 2015-07-10 DIAGNOSIS — R269 Unspecified abnormalities of gait and mobility: Secondary | ICD-10-CM | POA: Diagnosis not present

## 2015-07-12 DIAGNOSIS — R262 Difficulty in walking, not elsewhere classified: Secondary | ICD-10-CM | POA: Diagnosis not present

## 2015-07-12 DIAGNOSIS — M6281 Muscle weakness (generalized): Secondary | ICD-10-CM | POA: Diagnosis not present

## 2015-07-12 DIAGNOSIS — G547 Phantom limb syndrome without pain: Secondary | ICD-10-CM | POA: Diagnosis not present

## 2015-07-12 DIAGNOSIS — M25651 Stiffness of right hip, not elsewhere classified: Secondary | ICD-10-CM | POA: Diagnosis not present

## 2015-07-12 DIAGNOSIS — R269 Unspecified abnormalities of gait and mobility: Secondary | ICD-10-CM | POA: Diagnosis not present

## 2015-07-12 DIAGNOSIS — Z89611 Acquired absence of right leg above knee: Secondary | ICD-10-CM | POA: Diagnosis not present

## 2015-07-16 DIAGNOSIS — G547 Phantom limb syndrome without pain: Secondary | ICD-10-CM | POA: Diagnosis not present

## 2015-07-16 DIAGNOSIS — R269 Unspecified abnormalities of gait and mobility: Secondary | ICD-10-CM | POA: Diagnosis not present

## 2015-07-16 DIAGNOSIS — R262 Difficulty in walking, not elsewhere classified: Secondary | ICD-10-CM | POA: Diagnosis not present

## 2015-07-16 DIAGNOSIS — Z89611 Acquired absence of right leg above knee: Secondary | ICD-10-CM | POA: Diagnosis not present

## 2015-07-16 DIAGNOSIS — M25651 Stiffness of right hip, not elsewhere classified: Secondary | ICD-10-CM | POA: Diagnosis not present

## 2015-07-16 DIAGNOSIS — R600 Localized edema: Secondary | ICD-10-CM | POA: Diagnosis not present

## 2015-07-16 DIAGNOSIS — M6281 Muscle weakness (generalized): Secondary | ICD-10-CM | POA: Diagnosis not present

## 2015-07-18 DIAGNOSIS — M25651 Stiffness of right hip, not elsewhere classified: Secondary | ICD-10-CM | POA: Diagnosis not present

## 2015-07-18 DIAGNOSIS — R269 Unspecified abnormalities of gait and mobility: Secondary | ICD-10-CM | POA: Diagnosis not present

## 2015-07-18 DIAGNOSIS — R262 Difficulty in walking, not elsewhere classified: Secondary | ICD-10-CM | POA: Diagnosis not present

## 2015-07-18 DIAGNOSIS — M6281 Muscle weakness (generalized): Secondary | ICD-10-CM | POA: Diagnosis not present

## 2015-07-18 DIAGNOSIS — G547 Phantom limb syndrome without pain: Secondary | ICD-10-CM | POA: Diagnosis not present

## 2015-07-18 DIAGNOSIS — Z89611 Acquired absence of right leg above knee: Secondary | ICD-10-CM | POA: Diagnosis not present

## 2015-07-24 DIAGNOSIS — M6281 Muscle weakness (generalized): Secondary | ICD-10-CM | POA: Diagnosis not present

## 2015-07-24 DIAGNOSIS — R262 Difficulty in walking, not elsewhere classified: Secondary | ICD-10-CM | POA: Diagnosis not present

## 2015-07-24 DIAGNOSIS — M25651 Stiffness of right hip, not elsewhere classified: Secondary | ICD-10-CM | POA: Diagnosis not present

## 2015-07-24 DIAGNOSIS — G547 Phantom limb syndrome without pain: Secondary | ICD-10-CM | POA: Diagnosis not present

## 2015-07-24 DIAGNOSIS — Z89611 Acquired absence of right leg above knee: Secondary | ICD-10-CM | POA: Diagnosis not present

## 2015-07-24 DIAGNOSIS — R269 Unspecified abnormalities of gait and mobility: Secondary | ICD-10-CM | POA: Diagnosis not present

## 2015-07-26 ENCOUNTER — Encounter: Payer: Self-pay | Admitting: Family

## 2015-07-31 DIAGNOSIS — M25651 Stiffness of right hip, not elsewhere classified: Secondary | ICD-10-CM | POA: Diagnosis not present

## 2015-07-31 DIAGNOSIS — R269 Unspecified abnormalities of gait and mobility: Secondary | ICD-10-CM | POA: Diagnosis not present

## 2015-07-31 DIAGNOSIS — G547 Phantom limb syndrome without pain: Secondary | ICD-10-CM | POA: Diagnosis not present

## 2015-07-31 DIAGNOSIS — Z89611 Acquired absence of right leg above knee: Secondary | ICD-10-CM | POA: Diagnosis not present

## 2015-07-31 DIAGNOSIS — R262 Difficulty in walking, not elsewhere classified: Secondary | ICD-10-CM | POA: Diagnosis not present

## 2015-07-31 DIAGNOSIS — M6281 Muscle weakness (generalized): Secondary | ICD-10-CM | POA: Diagnosis not present

## 2015-08-01 ENCOUNTER — Ambulatory Visit (INDEPENDENT_AMBULATORY_CARE_PROVIDER_SITE_OTHER): Payer: Medicare Other | Admitting: Family

## 2015-08-01 ENCOUNTER — Ambulatory Visit (HOSPITAL_COMMUNITY)
Admission: RE | Admit: 2015-08-01 | Discharge: 2015-08-01 | Disposition: A | Discharge status: Home / Self Care | Payer: Medicare Other | Source: Ambulatory Visit | Attending: Family | Admitting: Family

## 2015-08-01 ENCOUNTER — Encounter: Payer: Self-pay | Admitting: Family

## 2015-08-01 VITALS — BP 165/75 | HR 85 | Ht 63.0 in | Wt 86.0 lb

## 2015-08-01 DIAGNOSIS — I739 Peripheral vascular disease, unspecified: Secondary | ICD-10-CM | POA: Diagnosis not present

## 2015-08-01 DIAGNOSIS — I779 Disorder of arteries and arterioles, unspecified: Secondary | ICD-10-CM | POA: Diagnosis not present

## 2015-08-01 DIAGNOSIS — Z720 Tobacco use: Secondary | ICD-10-CM | POA: Insufficient documentation

## 2015-08-01 DIAGNOSIS — Z89611 Acquired absence of right leg above knee: Secondary | ICD-10-CM | POA: Diagnosis not present

## 2015-08-01 DIAGNOSIS — Z4889 Encounter for other specified surgical aftercare: Secondary | ICD-10-CM

## 2015-08-01 DIAGNOSIS — F17200 Nicotine dependence, unspecified, uncomplicated: Secondary | ICD-10-CM

## 2015-08-01 DIAGNOSIS — Z48812 Encounter for surgical aftercare following surgery on the circulatory system: Secondary | ICD-10-CM

## 2015-08-01 NOTE — Patient Instructions (Signed)
Peripheral Vascular Disease Peripheral vascular disease (PVD) is a disease of the blood vessels that are not part of your heart and brain. A simple term for PVD is poor circulation. In most cases, PVD narrows the blood vessels that carry blood from your heart to the rest of your body. This can result in a decreased supply of blood to your arms, legs, and internal organs, like your stomach or kidneys. However, it most often affects a person's lower legs and feet. There are two types of PVD.  Organic PVD. This is the more common type. It is caused by damage to the structure of blood vessels.  Functional PVD. This is caused by conditions that make blood vessels contract and tighten (spasm). Without treatment, PVD tends to get worse over time. PVD can also lead to acute ischemic limb. This is when an arm or limb suddenly has trouble getting enough blood. This is a medical emergency. CAUSES Each type of PVD has many different causes. The most common cause of PVD is buildup of a fatty material (plaque) inside of your arteries (atherosclerosis). Small amounts of plaque can break off from the walls of the blood vessels and become lodged in a smaller artery. This blocks blood flow and can cause acute ischemic limb. Other common causes of PVD include:  Blood clots that form inside of blood vessels.  Injuries to blood vessels.  Diseases that cause inflammation of blood vessels or cause blood vessel spasms.  Health behaviors and health history that increase your risk of developing PVD. RISK FACTORS  You may have a greater risk of PVD if you:  Have a family history of PVD.  Have certain medical conditions, including:  High cholesterol.  Diabetes.  High blood pressure (hypertension).  Coronary heart disease.  Past problems with blood clots.  Past injury, such as burns or a broken bone. These may have damaged blood vessels in your limbs.  Buerger disease. This is caused by inflamed blood  vessels in your hands and feet.  Some forms of arthritis.  Rare birth defects that affect the arteries in your legs.  Use tobacco.  Do not get enough exercise.  Are obese.  Are age 50 or older. SIGNS AND SYMPTOMS  PVD may cause many different symptoms. Your symptoms depend on what part of your body is not getting enough blood. Some common signs and symptoms include:  Cramps in your lower legs. This may be a symptom of poor leg circulation (claudication).  Pain and weakness in your legs while you are physically active that goes away when you rest (intermittent claudication).  Leg pain when at rest.  Leg numbness, tingling, or weakness.  Coldness in a leg or foot, especially when compared with the other leg.  Skin or hair changes. These can include:  Hair loss.  Shiny skin.  Pale or bluish skin.  Thick toenails.  Inability to get or maintain an erection (erectile dysfunction). People with PVD are more prone to developing ulcers and sores on their toes, feet, or legs. These may take longer than normal to heal. DIAGNOSIS Your health care provider may diagnose PVD from your signs and symptoms. The health care provider will also do a physical exam. You may have tests to find out what is causing your PVD and determine its severity. Tests may include:  Blood pressure recordings from your arms and legs and measurements of the strength of your pulses (pulse volume recordings).  Imaging studies using sound waves to take pictures of   the blood flow through your blood vessels (Doppler ultrasound).  Injecting a dye into your blood vessels before having imaging studies using:  X-rays (angiogram or arteriogram).  Computer-generated X-rays (CT angiogram).  A powerful electromagnetic field and a computer (magnetic resonance angiogram or MRA). TREATMENT Treatment for PVD depends on the cause of your condition and the severity of your symptoms. It also depends on your age. Underlying  causes need to be treated and controlled. These include long-lasting (chronic) conditions, such as diabetes, high cholesterol, and high blood pressure. You may need to first try making lifestyle changes and taking medicines. Surgery may be needed if these do not work. Lifestyle changes may include:  Quitting smoking.  Exercising regularly.  Following a low-fat, low-cholesterol diet. Medicines may include:  Blood thinners to prevent blood clots.  Medicines to improve blood flow.  Medicines to improve your blood cholesterol levels. Surgical procedures may include:  A procedure that uses an inflated balloon to open a blocked artery and improve blood flow (angioplasty).  A procedure to put in a tube (stent) to keep a blocked artery open (stent implant).  Surgery to reroute blood flow around a blocked artery (peripheral bypass surgery).  Surgery to remove dead tissue from an infected wound on the affected limb.  Amputation. This is surgical removal of the affected limb. This may be necessary in cases of acute ischemic limb that are not improved through medical or surgical treatments. HOME CARE INSTRUCTIONS  Take medicines only as directed by your health care provider.  Do not use any tobacco products, including cigarettes, chewing tobacco, or electronic cigarettes. If you need help quitting, ask your health care provider.  Lose weight if you are overweight, and maintain a healthy weight as directed by your health care provider.  Eat a diet that is low in fat and cholesterol. If you need help, ask your health care provider.  Exercise regularly. Ask your health care provider to suggest some good activities for you.  Use compression stockings or other mechanical devices as directed by your health care provider.  Take good care of your feet.  Wear comfortable shoes that fit well.  Check your feet often for any cuts or sores. SEEK MEDICAL CARE IF:  You have cramps in your legs  while walking.  You have leg pain when you are at rest.  You have coldness in a leg or foot.  Your skin changes.  You have erectile dysfunction.  You have cuts or sores on your feet that are not healing. SEEK IMMEDIATE MEDICAL CARE IF:  Your arm or leg turns cold and blue.  Your arms or legs become red, warm, swollen, painful, or numb.  You have chest pain or trouble breathing.  You suddenly have weakness in your face, arm, or leg.  You become very confused or lose the ability to speak.  You suddenly have a very bad headache or lose your vision.   This information is not intended to replace advice given to you by your health care provider. Make sure you discuss any questions you have with your health care provider.   Document Released: 04/09/2004 Document Revised: 03/23/2014 Document Reviewed: 08/10/2013 Elsevier Interactive Patient Education 2016 Elsevier Inc[employer]     Steps to Quit Smoking  Smoking tobacco can be harmful to your health and can affect almost every organ in your body. Smoking puts you, and those around you, at risk for developing many serious chronic diseases. Quitting smoking is difficult, but it is one of   the best things that you can do for your health. It is never too late to quit. WHAT ARE THE BENEFITS OF QUITTING SMOKING? When you quit smoking, you lower your risk of developing serious diseases and conditions, such as:  Lung cancer or lung disease, such as COPD.  Heart disease.  Stroke.  Heart attack.  Infertility.  Osteoporosis and bone fractures. Additionally, symptoms such as coughing, wheezing, and shortness of breath may get better when you quit. You may also find that you get sick less often because your body is stronger at fighting off colds and infections. If you are pregnant, quitting smoking can help to reduce your chances of having a baby of low birth weight. HOW DO I GET READY TO QUIT? When you decide to quit smoking, create a plan to  make sure that you are successful. Before you quit:  Pick a date to quit. Set a date within the next two weeks to give you time to prepare.  Write down the reasons why you are quitting. Keep this list in places where you will see it often, such as on your bathroom mirror or in your car or wallet.  Identify the people, places, things, and activities that make you want to smoke (triggers) and avoid them. Make sure to take these actions:  Throw away all cigarettes at home, at work, and in your car.  Throw away smoking accessories, such as ashtrays and lighters.  Clean your car and make sure to empty the ashtray.  Clean your home, including curtains and carpets.  Tell your family, friends, and coworkers that you are quitting. Support from your loved ones can make quitting easier.  Talk with your health care provider about your options for quitting smoking.  Find out what treatment options are covered by your health insurance. WHAT STRATEGIES CAN I USE TO QUIT SMOKING?  Talk with your healthcare provider about different strategies to quit smoking. Some strategies include:  Quitting smoking altogether instead of gradually lessening how much you smoke over a period of time. Research shows that quitting "cold turkey" is more successful than gradually quitting.  Attending in-person counseling to help you build problem-solving skills. You are more likely to have success in quitting if you attend several counseling sessions. Even short sessions of 10 minutes can be effective.  Finding resources and support systems that can help you to quit smoking and remain smoke-free after you quit. These resources are most helpful when you use them often. They can include:  Online chats with a counselor.  Telephone quitlines.  Printed self-help materials.  Support groups or group counseling.  Text messaging programs.  Mobile phone applications.  Taking medicines to help you quit smoking. (If you are  pregnant or breastfeeding, talk with your health care provider first.) Some medicines contain nicotine and some do not. Both types of medicines help with cravings, but the medicines that include nicotine help to relieve withdrawal symptoms. Your health care provider may recommend:  Nicotine patches, gum, or lozenges.  Nicotine inhalers or sprays.  Non-nicotine medicine that is taken by mouth. Talk with your health care provider about combining strategies, such as taking medicines while you are also receiving in-person counseling. Using these two strategies together makes you more likely to succeed in quitting than if you used either strategy on its own. If you are pregnant or breastfeeding, talk with your health care provider about finding counseling or other support strategies to quit smoking. Do not take medicine to help you   quit smoking unless told to do so by your health care provider. WHAT THINGS CAN I DO TO MAKE IT EASIER TO QUIT? Quitting smoking might feel overwhelming at first, but there is a lot that you can do to make it easier. Take these important actions:  Reach out to your family and friends and ask that they support and encourage you during this time. Call telephone quitlines, reach out to support groups, or work with a counselor for support.  Ask people who smoke to avoid smoking around you.  Avoid places that trigger you to smoke, such as bars, parties, or smoke-break areas at work.  Spend time around people who do not smoke.  Lessen stress in your life, because stress can be a smoking trigger for some people. To lessen stress, try:  Exercising regularly.  Deep-breathing exercises.  Yoga.  Meditating.  Performing a body scan. This involves closing your eyes, scanning your body from head to toe, and noticing which parts of your body are particularly tense. Purposefully relax the muscles in those areas.  Download or purchase mobile phone or tablet apps (applications)  that can help you stick to your quit plan by providing reminders, tips, and encouragement. There are many free apps, such as QuitGuide from the CDC (Centers for Disease Control and Prevention). You can find other support for quitting smoking (smoking cessation) through smokefree.gov and other websites. HOW WILL I FEEL WHEN I QUIT SMOKING? Within the first 24 hours of quitting smoking, you may start to feel some withdrawal symptoms. These symptoms are usually most noticeable 2-3 days after quitting, but they usually do not last beyond 2-3 weeks. Changes or symptoms that you might experience include:  Mood swings.  Restlessness, anxiety, or irritation.  Difficulty concentrating.  Dizziness.  Strong cravings for sugary foods in addition to nicotine.  Mild weight gain.  Constipation.  Nausea.  Coughing or a sore throat.  Changes in how your medicines work in your body.  A depressed mood.  Difficulty sleeping (insomnia). After the first 2-3 weeks of quitting, you may start to notice more positive results, such as:  Improved sense of smell and taste.  Decreased coughing and sore throat.  Slower heart rate.  Lower blood pressure.  Clearer skin.  The ability to breathe more easily.  Fewer sick days. Quitting smoking is very challenging for most people. Do not get discouraged if you are not successful the first time. Some people need to make many attempts to quit before they achieve long-term success. Do your best to stick to your quit plan, and talk with your health care provider if you have any questions or concerns.   This information is not intended to replace advice given to you by your health care provider. Make sure you discuss any questions you have with your health care provider.   Document Released: 02/24/2001 Document Revised: 07/17/2014 Document Reviewed: 07/17/2014 Elsevier Interactive Patient Education 2016 Elsevier Inc.  

## 2015-08-01 NOTE — Progress Notes (Signed)
[facility] [place] [place]   CC: Follow up peripheral artery occlusive disease  History of Present Illness Heather Jimenez is a 70 y.o. female patient of Dr. Oneida Alar who is s/p Right AKA on 12-18-14.   She is known to Dr. Oneida Alar from having undergone prior aortobifemoral bypass and right femoral popliteal bypass in 2010.  At her 12/13/14 visit she had not been seen in the office since 2011. She is no longer able to speak following laryngectomy for throat cancer. She uses an amplifier to speak and her history is aided by her husband.  Unfortunately she still smokes, but has decreased to less than 1/3 ppd. She denies any non healing wounds or problems with her left leg. She is being fitted for a right AKA prosthesis from [employer]. She is receiving physical therapy twice/week. She is able to bake, take care of herself, stand on her left leg.  She does not like the filter she received for her trach and does not use it.   Pt Diabetic: No Pt smoker: smoker (1/3 ppd, started at age 60 yrs)  Pt meds include: Statin :No Betablocker: No ASA: Yes Other anticoagulants/antiplatelets: no    Past Medical History  Diagnosis Date  . PAD (peripheral artery disease) ([place])   . Dyslipidemia   . Carotid bruit   . Tobacco dependence   . Throat cancer ([place])   . Pneumonia     Social History Social History  Substance Use Topics  . Smoking status: Light Tobacco Smoker -- 1.00 packs/day for 35 years  . Smokeless tobacco: Never Used  . Alcohol Use: No    Family History Family History  Problem Relation Age of Onset  . Cancer Mother   . Lung cancer Brother     Past Surgical History  Procedure Laterality Date  . Aorta - bilateral femoral artery bypass graft    . Tonsillectomy    . Total laryngectomy    . Peg tube placement    . Peg tube removal    . Peripheral vascular catheterization N/A 12/14/2014    Procedure: Abdominal Aortogram;  Surgeon: Elam Dutch, MD;   Location: [facility];  Service: Cardiovascular;  Laterality: N/A;  . Lower extremity angiogram Bilateral 12/14/2014    Procedure: Lower Extremity Angiogram;  Surgeon: Elam Dutch, MD;  Location: [facility];  Service: Cardiovascular;  Laterality: Bilateral;  . Colonoscopy    . Amputation Right 12/18/2014    Procedure: AMPUTATION ABOVE KNEE;  Surgeon: Elam Dutch, MD;  Location: [facility] OR;  Service: Vascular;  Laterality: Right;    No Known Allergies  Current Outpatient Prescriptions  Medication Sig Dispense Refill  . aspirin EC 81 MG tablet Take 81 mg by mouth daily as needed (pain).    Marland Kitchen doxycycline (VIBRA-TABS) 100 MG tablet Take 100 mg by mouth 2 (two) times daily.     . furosemide (LASIX) 20 MG tablet Take 20 mg by mouth 3 (three) times daily. Reported on 05/02/2015  5  . gabapentin (NEURONTIN) 400 MG capsule Take 400 mg by mouth 3 (three) times daily. Reported on 05/02/2015  12  . KLOR-CON M20 20 MEQ tablet Take 20 mEq by mouth 2 (two) times daily.     Marland Kitchen lisinopril (PRINIVIL,ZESTRIL) 20 MG tablet TK 1 T PO D  12  . meloxicam (MOBIC) 15 MG tablet     . sulfamethoxazole-trimethoprim (BACTRIM DS,SEPTRA DS) 800-160 MG tablet Take 1 tablet by mouth 2 (two) times daily.  Reported on 05/02/2015  0   No current facility-administered medications for this visit.    ROS: See HPI for pertinent positives and negatives.   Physical Examination  Filed Vitals:   08/01/15 0905  BP: 165/75  Pulse: 85  Height: '5\' 3"'$  (1.6 m)  Weight: 86 lb (39.009 kg)  SpO2: 97%   Body mass index is 15.24 kg/(m^2).  General: A&O x 3, WDWN, thin female. Gait: seated in her wheelchair Eyes: PERRLA. Pulmonary: respirations are non labored, BBS are distant, no rales, rhonchi or wheezes, tracheotomy covered by a protective cloth kept in place with ties around her neck. Cardiac: regular rhythm, no detected murmur.     Carotid Bruits Right Left   Negative Negative  Aorta is not  palpable. Radial pulses: radial pulses not palpable; left ulnar pulse is 1+ palpable, right brachial pulse is 1+ palpable. Both hands are pink and all fingers with brisk capillary refill.   VASCULAR EXAM: Extremities without ischemic changes, without Gangrene; without open wounds. Right AKA stump is well healed, no ulcers.      LE Pulses Right Left   FEMORAL not palpable 2+ palpable    POPLITEAL AKA  not palpable   POSTERIOR TIBIAL AKA  not palpable    DORSALIS PEDIS  ANTERIOR TIBIAL AKA  not palpable    Abdomen: soft, NT, no palpable masses. Skin: no rashes, see Extremities. Musculoskeletal: no muscle wasting or atrophy. Neurologic: A&O X 3; Appropriate Affect ; SENSATION: normal; MOTOR FUNCTION: moving all extremities equally, motor strength 5/5 throughout. Speech is made through an amplification device she wears at her waist on her pants, CN 2-12 intact.         Non-Invasive Vascular Imaging: DATE: 08/01/2015 ABI: RIGHT: AKA;  LEFT: 0.82 (0.79), Waveforms: biphasic PT, monophasic AT   ASSESSMENT: Heather Jimenez is a 70 y.o. female who is s/p right AKA on 12-18-14. Her right AKA stump is well healed, she is working with [employer] for fitting a prosthesis.  She walks a great deal with her walker, has no claudication in her left leg. She has no signs of ischemia in her left foot/leg, her stump has no signs of pressure ulcers, no wounds.  Left ABI indicates mild arterial occlusive disease, biphasic PT (was monophasic 05/02/15), and monophasic AT (was absent) waveforms with normal TBI. Waveform quality improved.   Unfortunately she continues to smoke despite having undergone laryngectomy for throat cancer. Smoking is her primary atherosclerotic risk factor.  I  advised pt to see her PCP as soon as possible re her elevated blood pressure, states she does not check her blood pressure at home.   PLAN:  The patient was counseled re smoking cessation and given several free resources re smoking cessation.  Continue graduated walking program. Based on the patient's vascular studies and examination, pt will return to clinic in 9 months with left ABI.   I discussed in depth with the patient the nature of atherosclerosis, and emphasized the importance of maximal medical management including strict control of blood pressure, blood glucose, and lipid levels, obtaining regular exercise, and cessation of smoking.  The patient is aware that without maximal medical management the underlying atherosclerotic disease process will progress, limiting the benefit of any interventions.  The patient was given information about PAD including signs, symptoms, treatment, what symptoms should prompt the patient to seek immediate medical care, and risk reduction measures to take.  Clemon Chambers, RN, MSN, FNP-C [facility] Phone: [phone]  Clinic MD: Bridgett Larsson  08/01/2015 9:19 AM

## 2015-08-02 DIAGNOSIS — M6281 Muscle weakness (generalized): Secondary | ICD-10-CM | POA: Diagnosis not present

## 2015-08-02 DIAGNOSIS — R262 Difficulty in walking, not elsewhere classified: Secondary | ICD-10-CM | POA: Diagnosis not present

## 2015-08-02 DIAGNOSIS — G547 Phantom limb syndrome without pain: Secondary | ICD-10-CM | POA: Diagnosis not present

## 2015-08-02 DIAGNOSIS — M25651 Stiffness of right hip, not elsewhere classified: Secondary | ICD-10-CM | POA: Diagnosis not present

## 2015-08-02 DIAGNOSIS — R269 Unspecified abnormalities of gait and mobility: Secondary | ICD-10-CM | POA: Diagnosis not present

## 2015-08-02 DIAGNOSIS — Z89611 Acquired absence of right leg above knee: Secondary | ICD-10-CM | POA: Diagnosis not present

## 2015-08-03 DIAGNOSIS — I10 Essential (primary) hypertension: Secondary | ICD-10-CM | POA: Diagnosis not present

## 2015-08-03 DIAGNOSIS — R600 Localized edema: Secondary | ICD-10-CM | POA: Diagnosis not present

## 2015-08-03 DIAGNOSIS — I739 Peripheral vascular disease, unspecified: Secondary | ICD-10-CM | POA: Diagnosis not present

## 2015-08-03 DIAGNOSIS — J449 Chronic obstructive pulmonary disease, unspecified: Secondary | ICD-10-CM | POA: Diagnosis not present

## 2015-08-03 DIAGNOSIS — M159 Polyosteoarthritis, unspecified: Secondary | ICD-10-CM | POA: Diagnosis not present

## 2015-08-03 DIAGNOSIS — Z681 Body mass index (BMI) 19 or less, adult: Secondary | ICD-10-CM | POA: Diagnosis not present

## 2015-08-03 DIAGNOSIS — Z89611 Acquired absence of right leg above knee: Secondary | ICD-10-CM | POA: Diagnosis not present

## 2015-08-03 DIAGNOSIS — E7800 Pure hypercholesterolemia, unspecified: Secondary | ICD-10-CM | POA: Diagnosis not present

## 2015-08-06 DIAGNOSIS — Z923 Personal history of irradiation: Secondary | ICD-10-CM | POA: Diagnosis not present

## 2015-08-06 DIAGNOSIS — Z85118 Personal history of other malignant neoplasm of bronchus and lung: Secondary | ICD-10-CM | POA: Diagnosis not present

## 2015-08-06 DIAGNOSIS — Z85818 Personal history of malignant neoplasm of other sites of lip, oral cavity, and pharynx: Secondary | ICD-10-CM | POA: Diagnosis not present

## 2015-08-06 DIAGNOSIS — Z08 Encounter for follow-up examination after completed treatment for malignant neoplasm: Secondary | ICD-10-CM | POA: Diagnosis not present

## 2015-08-06 DIAGNOSIS — Z8521 Personal history of malignant neoplasm of larynx: Secondary | ICD-10-CM | POA: Diagnosis not present

## 2015-08-06 DIAGNOSIS — Z9889 Other specified postprocedural states: Secondary | ICD-10-CM | POA: Diagnosis not present

## 2015-08-06 DIAGNOSIS — Z9089 Acquired absence of other organs: Secondary | ICD-10-CM | POA: Diagnosis not present

## 2015-08-06 DIAGNOSIS — C139 Malignant neoplasm of hypopharynx, unspecified: Secondary | ICD-10-CM | POA: Diagnosis not present

## 2015-08-07 DIAGNOSIS — M25651 Stiffness of right hip, not elsewhere classified: Secondary | ICD-10-CM | POA: Diagnosis not present

## 2015-08-07 DIAGNOSIS — R269 Unspecified abnormalities of gait and mobility: Secondary | ICD-10-CM | POA: Diagnosis not present

## 2015-08-07 DIAGNOSIS — R262 Difficulty in walking, not elsewhere classified: Secondary | ICD-10-CM | POA: Diagnosis not present

## 2015-08-07 DIAGNOSIS — M6281 Muscle weakness (generalized): Secondary | ICD-10-CM | POA: Diagnosis not present

## 2015-08-07 DIAGNOSIS — Z89611 Acquired absence of right leg above knee: Secondary | ICD-10-CM | POA: Diagnosis not present

## 2015-08-07 DIAGNOSIS — G547 Phantom limb syndrome without pain: Secondary | ICD-10-CM | POA: Diagnosis not present

## 2015-08-09 DIAGNOSIS — M6281 Muscle weakness (generalized): Secondary | ICD-10-CM | POA: Diagnosis not present

## 2015-08-09 DIAGNOSIS — G547 Phantom limb syndrome without pain: Secondary | ICD-10-CM | POA: Diagnosis not present

## 2015-08-09 DIAGNOSIS — R262 Difficulty in walking, not elsewhere classified: Secondary | ICD-10-CM | POA: Diagnosis not present

## 2015-08-09 DIAGNOSIS — M25651 Stiffness of right hip, not elsewhere classified: Secondary | ICD-10-CM | POA: Diagnosis not present

## 2015-08-09 DIAGNOSIS — Z89611 Acquired absence of right leg above knee: Secondary | ICD-10-CM | POA: Diagnosis not present

## 2015-08-09 DIAGNOSIS — R269 Unspecified abnormalities of gait and mobility: Secondary | ICD-10-CM | POA: Diagnosis not present

## 2015-08-14 DIAGNOSIS — G547 Phantom limb syndrome without pain: Secondary | ICD-10-CM | POA: Diagnosis not present

## 2015-08-14 DIAGNOSIS — M6281 Muscle weakness (generalized): Secondary | ICD-10-CM | POA: Diagnosis not present

## 2015-08-14 DIAGNOSIS — R262 Difficulty in walking, not elsewhere classified: Secondary | ICD-10-CM | POA: Diagnosis not present

## 2015-08-14 DIAGNOSIS — M25651 Stiffness of right hip, not elsewhere classified: Secondary | ICD-10-CM | POA: Diagnosis not present

## 2015-08-14 DIAGNOSIS — Z89611 Acquired absence of right leg above knee: Secondary | ICD-10-CM | POA: Diagnosis not present

## 2015-08-14 DIAGNOSIS — R269 Unspecified abnormalities of gait and mobility: Secondary | ICD-10-CM | POA: Diagnosis not present

## 2015-08-16 DIAGNOSIS — M6281 Muscle weakness (generalized): Secondary | ICD-10-CM | POA: Diagnosis not present

## 2015-08-16 DIAGNOSIS — Z89611 Acquired absence of right leg above knee: Secondary | ICD-10-CM | POA: Diagnosis not present

## 2015-08-16 DIAGNOSIS — G547 Phantom limb syndrome without pain: Secondary | ICD-10-CM | POA: Diagnosis not present

## 2015-08-16 DIAGNOSIS — M25651 Stiffness of right hip, not elsewhere classified: Secondary | ICD-10-CM | POA: Diagnosis not present

## 2015-08-16 DIAGNOSIS — R262 Difficulty in walking, not elsewhere classified: Secondary | ICD-10-CM | POA: Diagnosis not present

## 2015-08-16 DIAGNOSIS — R600 Localized edema: Secondary | ICD-10-CM | POA: Diagnosis not present

## 2015-08-16 DIAGNOSIS — R269 Unspecified abnormalities of gait and mobility: Secondary | ICD-10-CM | POA: Diagnosis not present

## 2015-08-21 DIAGNOSIS — G547 Phantom limb syndrome without pain: Secondary | ICD-10-CM | POA: Diagnosis not present

## 2015-08-21 DIAGNOSIS — M25651 Stiffness of right hip, not elsewhere classified: Secondary | ICD-10-CM | POA: Diagnosis not present

## 2015-08-21 DIAGNOSIS — R269 Unspecified abnormalities of gait and mobility: Secondary | ICD-10-CM | POA: Diagnosis not present

## 2015-08-21 DIAGNOSIS — M6281 Muscle weakness (generalized): Secondary | ICD-10-CM | POA: Diagnosis not present

## 2015-08-21 DIAGNOSIS — R600 Localized edema: Secondary | ICD-10-CM | POA: Diagnosis not present

## 2015-08-21 DIAGNOSIS — R262 Difficulty in walking, not elsewhere classified: Secondary | ICD-10-CM | POA: Diagnosis not present

## 2015-08-27 DIAGNOSIS — R490 Dysphonia: Secondary | ICD-10-CM | POA: Diagnosis not present

## 2015-08-28 DIAGNOSIS — R600 Localized edema: Secondary | ICD-10-CM | POA: Diagnosis not present

## 2015-08-28 DIAGNOSIS — M25651 Stiffness of right hip, not elsewhere classified: Secondary | ICD-10-CM | POA: Diagnosis not present

## 2015-08-28 DIAGNOSIS — R262 Difficulty in walking, not elsewhere classified: Secondary | ICD-10-CM | POA: Diagnosis not present

## 2015-08-28 DIAGNOSIS — G547 Phantom limb syndrome without pain: Secondary | ICD-10-CM | POA: Diagnosis not present

## 2015-08-28 DIAGNOSIS — R269 Unspecified abnormalities of gait and mobility: Secondary | ICD-10-CM | POA: Diagnosis not present

## 2015-08-28 DIAGNOSIS — M6281 Muscle weakness (generalized): Secondary | ICD-10-CM | POA: Diagnosis not present

## 2015-08-30 DIAGNOSIS — M6281 Muscle weakness (generalized): Secondary | ICD-10-CM | POA: Diagnosis not present

## 2015-08-30 DIAGNOSIS — R262 Difficulty in walking, not elsewhere classified: Secondary | ICD-10-CM | POA: Diagnosis not present

## 2015-08-30 DIAGNOSIS — R600 Localized edema: Secondary | ICD-10-CM | POA: Diagnosis not present

## 2015-08-30 DIAGNOSIS — R269 Unspecified abnormalities of gait and mobility: Secondary | ICD-10-CM | POA: Diagnosis not present

## 2015-08-30 DIAGNOSIS — G547 Phantom limb syndrome without pain: Secondary | ICD-10-CM | POA: Diagnosis not present

## 2015-08-30 DIAGNOSIS — M25651 Stiffness of right hip, not elsewhere classified: Secondary | ICD-10-CM | POA: Diagnosis not present

## 2015-09-04 DIAGNOSIS — G547 Phantom limb syndrome without pain: Secondary | ICD-10-CM | POA: Diagnosis not present

## 2015-09-04 DIAGNOSIS — R262 Difficulty in walking, not elsewhere classified: Secondary | ICD-10-CM | POA: Diagnosis not present

## 2015-09-04 DIAGNOSIS — R600 Localized edema: Secondary | ICD-10-CM | POA: Diagnosis not present

## 2015-09-04 DIAGNOSIS — M6281 Muscle weakness (generalized): Secondary | ICD-10-CM | POA: Diagnosis not present

## 2015-09-04 DIAGNOSIS — R269 Unspecified abnormalities of gait and mobility: Secondary | ICD-10-CM | POA: Diagnosis not present

## 2015-09-04 DIAGNOSIS — M25651 Stiffness of right hip, not elsewhere classified: Secondary | ICD-10-CM | POA: Diagnosis not present

## 2015-09-06 DIAGNOSIS — R269 Unspecified abnormalities of gait and mobility: Secondary | ICD-10-CM | POA: Diagnosis not present

## 2015-09-06 DIAGNOSIS — R262 Difficulty in walking, not elsewhere classified: Secondary | ICD-10-CM | POA: Diagnosis not present

## 2015-09-06 DIAGNOSIS — M6281 Muscle weakness (generalized): Secondary | ICD-10-CM | POA: Diagnosis not present

## 2015-09-06 DIAGNOSIS — M25651 Stiffness of right hip, not elsewhere classified: Secondary | ICD-10-CM | POA: Diagnosis not present

## 2015-09-06 DIAGNOSIS — G547 Phantom limb syndrome without pain: Secondary | ICD-10-CM | POA: Diagnosis not present

## 2015-09-06 DIAGNOSIS — R600 Localized edema: Secondary | ICD-10-CM | POA: Diagnosis not present

## 2015-09-07 DIAGNOSIS — Z720 Tobacco use: Secondary | ICD-10-CM | POA: Diagnosis not present

## 2015-09-07 DIAGNOSIS — J449 Chronic obstructive pulmonary disease, unspecified: Secondary | ICD-10-CM | POA: Diagnosis not present

## 2015-09-07 DIAGNOSIS — I10 Essential (primary) hypertension: Secondary | ICD-10-CM | POA: Diagnosis not present

## 2015-09-07 DIAGNOSIS — R636 Underweight: Secondary | ICD-10-CM | POA: Diagnosis not present

## 2015-09-07 DIAGNOSIS — E7800 Pure hypercholesterolemia, unspecified: Secondary | ICD-10-CM | POA: Diagnosis not present

## 2015-09-07 DIAGNOSIS — M159 Polyosteoarthritis, unspecified: Secondary | ICD-10-CM | POA: Diagnosis not present

## 2015-09-07 DIAGNOSIS — R600 Localized edema: Secondary | ICD-10-CM | POA: Diagnosis not present

## 2015-09-07 DIAGNOSIS — E876 Hypokalemia: Secondary | ICD-10-CM | POA: Diagnosis not present

## 2015-09-07 DIAGNOSIS — C140 Malignant neoplasm of pharynx, unspecified: Secondary | ICD-10-CM | POA: Diagnosis not present

## 2015-09-07 DIAGNOSIS — G629 Polyneuropathy, unspecified: Secondary | ICD-10-CM | POA: Diagnosis not present

## 2015-09-07 DIAGNOSIS — I739 Peripheral vascular disease, unspecified: Secondary | ICD-10-CM | POA: Diagnosis not present

## 2015-09-07 DIAGNOSIS — Z89611 Acquired absence of right leg above knee: Secondary | ICD-10-CM | POA: Diagnosis not present

## 2015-09-10 DIAGNOSIS — G547 Phantom limb syndrome without pain: Secondary | ICD-10-CM | POA: Diagnosis not present

## 2015-09-10 DIAGNOSIS — M25651 Stiffness of right hip, not elsewhere classified: Secondary | ICD-10-CM | POA: Diagnosis not present

## 2015-09-10 DIAGNOSIS — R262 Difficulty in walking, not elsewhere classified: Secondary | ICD-10-CM | POA: Diagnosis not present

## 2015-09-10 DIAGNOSIS — M6281 Muscle weakness (generalized): Secondary | ICD-10-CM | POA: Diagnosis not present

## 2015-09-10 DIAGNOSIS — R600 Localized edema: Secondary | ICD-10-CM | POA: Diagnosis not present

## 2015-09-10 DIAGNOSIS — R269 Unspecified abnormalities of gait and mobility: Secondary | ICD-10-CM | POA: Diagnosis not present

## 2015-09-13 DIAGNOSIS — M6281 Muscle weakness (generalized): Secondary | ICD-10-CM | POA: Diagnosis not present

## 2015-09-13 DIAGNOSIS — G547 Phantom limb syndrome without pain: Secondary | ICD-10-CM | POA: Diagnosis not present

## 2015-09-13 DIAGNOSIS — R600 Localized edema: Secondary | ICD-10-CM | POA: Diagnosis not present

## 2015-09-13 DIAGNOSIS — R269 Unspecified abnormalities of gait and mobility: Secondary | ICD-10-CM | POA: Diagnosis not present

## 2015-09-13 DIAGNOSIS — R262 Difficulty in walking, not elsewhere classified: Secondary | ICD-10-CM | POA: Diagnosis not present

## 2015-09-13 DIAGNOSIS — M25651 Stiffness of right hip, not elsewhere classified: Secondary | ICD-10-CM | POA: Diagnosis not present

## 2015-09-20 DIAGNOSIS — R600 Localized edema: Secondary | ICD-10-CM | POA: Diagnosis not present

## 2015-09-20 DIAGNOSIS — M6281 Muscle weakness (generalized): Secondary | ICD-10-CM | POA: Diagnosis not present

## 2015-09-20 DIAGNOSIS — G547 Phantom limb syndrome without pain: Secondary | ICD-10-CM | POA: Diagnosis not present

## 2015-09-20 DIAGNOSIS — R269 Unspecified abnormalities of gait and mobility: Secondary | ICD-10-CM | POA: Diagnosis not present

## 2015-09-20 DIAGNOSIS — M25651 Stiffness of right hip, not elsewhere classified: Secondary | ICD-10-CM | POA: Diagnosis not present

## 2015-09-20 DIAGNOSIS — Z89611 Acquired absence of right leg above knee: Secondary | ICD-10-CM | POA: Diagnosis not present

## 2015-09-20 DIAGNOSIS — R262 Difficulty in walking, not elsewhere classified: Secondary | ICD-10-CM | POA: Diagnosis not present

## 2015-09-25 DIAGNOSIS — Z89611 Acquired absence of right leg above knee: Secondary | ICD-10-CM | POA: Diagnosis not present

## 2015-09-25 DIAGNOSIS — M25651 Stiffness of right hip, not elsewhere classified: Secondary | ICD-10-CM | POA: Diagnosis not present

## 2015-09-25 DIAGNOSIS — R262 Difficulty in walking, not elsewhere classified: Secondary | ICD-10-CM | POA: Diagnosis not present

## 2015-09-25 DIAGNOSIS — R269 Unspecified abnormalities of gait and mobility: Secondary | ICD-10-CM | POA: Diagnosis not present

## 2015-09-25 DIAGNOSIS — G547 Phantom limb syndrome without pain: Secondary | ICD-10-CM | POA: Diagnosis not present

## 2015-09-25 DIAGNOSIS — M6281 Muscle weakness (generalized): Secondary | ICD-10-CM | POA: Diagnosis not present

## 2015-09-27 DIAGNOSIS — Z89611 Acquired absence of right leg above knee: Secondary | ICD-10-CM | POA: Diagnosis not present

## 2015-09-27 DIAGNOSIS — M6281 Muscle weakness (generalized): Secondary | ICD-10-CM | POA: Diagnosis not present

## 2015-09-27 DIAGNOSIS — R269 Unspecified abnormalities of gait and mobility: Secondary | ICD-10-CM | POA: Diagnosis not present

## 2015-09-27 DIAGNOSIS — G547 Phantom limb syndrome without pain: Secondary | ICD-10-CM | POA: Diagnosis not present

## 2015-09-27 DIAGNOSIS — M25651 Stiffness of right hip, not elsewhere classified: Secondary | ICD-10-CM | POA: Diagnosis not present

## 2015-09-27 DIAGNOSIS — R262 Difficulty in walking, not elsewhere classified: Secondary | ICD-10-CM | POA: Diagnosis not present

## 2015-10-02 DIAGNOSIS — M25651 Stiffness of right hip, not elsewhere classified: Secondary | ICD-10-CM | POA: Diagnosis not present

## 2015-10-02 DIAGNOSIS — G547 Phantom limb syndrome without pain: Secondary | ICD-10-CM | POA: Diagnosis not present

## 2015-10-02 DIAGNOSIS — M6281 Muscle weakness (generalized): Secondary | ICD-10-CM | POA: Diagnosis not present

## 2015-10-02 DIAGNOSIS — R262 Difficulty in walking, not elsewhere classified: Secondary | ICD-10-CM | POA: Diagnosis not present

## 2015-10-02 DIAGNOSIS — R269 Unspecified abnormalities of gait and mobility: Secondary | ICD-10-CM | POA: Diagnosis not present

## 2015-10-02 DIAGNOSIS — Z89611 Acquired absence of right leg above knee: Secondary | ICD-10-CM | POA: Diagnosis not present

## 2015-10-04 DIAGNOSIS — M25651 Stiffness of right hip, not elsewhere classified: Secondary | ICD-10-CM | POA: Diagnosis not present

## 2015-10-04 DIAGNOSIS — R262 Difficulty in walking, not elsewhere classified: Secondary | ICD-10-CM | POA: Diagnosis not present

## 2015-10-04 DIAGNOSIS — Z89611 Acquired absence of right leg above knee: Secondary | ICD-10-CM | POA: Diagnosis not present

## 2015-10-04 DIAGNOSIS — G547 Phantom limb syndrome without pain: Secondary | ICD-10-CM | POA: Diagnosis not present

## 2015-10-04 DIAGNOSIS — M6281 Muscle weakness (generalized): Secondary | ICD-10-CM | POA: Diagnosis not present

## 2015-10-04 DIAGNOSIS — R269 Unspecified abnormalities of gait and mobility: Secondary | ICD-10-CM | POA: Diagnosis not present

## 2015-10-09 DIAGNOSIS — Z89611 Acquired absence of right leg above knee: Secondary | ICD-10-CM | POA: Diagnosis not present

## 2015-10-09 DIAGNOSIS — M6281 Muscle weakness (generalized): Secondary | ICD-10-CM | POA: Diagnosis not present

## 2015-10-09 DIAGNOSIS — R269 Unspecified abnormalities of gait and mobility: Secondary | ICD-10-CM | POA: Diagnosis not present

## 2015-10-09 DIAGNOSIS — M25651 Stiffness of right hip, not elsewhere classified: Secondary | ICD-10-CM | POA: Diagnosis not present

## 2015-10-09 DIAGNOSIS — R262 Difficulty in walking, not elsewhere classified: Secondary | ICD-10-CM | POA: Diagnosis not present

## 2015-10-09 DIAGNOSIS — G547 Phantom limb syndrome without pain: Secondary | ICD-10-CM | POA: Diagnosis not present

## 2015-10-11 DIAGNOSIS — R262 Difficulty in walking, not elsewhere classified: Secondary | ICD-10-CM | POA: Diagnosis not present

## 2015-10-11 DIAGNOSIS — G547 Phantom limb syndrome without pain: Secondary | ICD-10-CM | POA: Diagnosis not present

## 2015-10-11 DIAGNOSIS — M6281 Muscle weakness (generalized): Secondary | ICD-10-CM | POA: Diagnosis not present

## 2015-10-11 DIAGNOSIS — Z89611 Acquired absence of right leg above knee: Secondary | ICD-10-CM | POA: Diagnosis not present

## 2015-10-11 DIAGNOSIS — M25651 Stiffness of right hip, not elsewhere classified: Secondary | ICD-10-CM | POA: Diagnosis not present

## 2015-10-11 DIAGNOSIS — R269 Unspecified abnormalities of gait and mobility: Secondary | ICD-10-CM | POA: Diagnosis not present

## 2015-10-15 ENCOUNTER — Other Ambulatory Visit: Payer: Self-pay | Admitting: Family

## 2015-10-15 DIAGNOSIS — I779 Disorder of arteries and arterioles, unspecified: Secondary | ICD-10-CM

## 2015-10-15 DIAGNOSIS — Z89611 Acquired absence of right leg above knee: Secondary | ICD-10-CM

## 2015-10-16 DIAGNOSIS — M25651 Stiffness of right hip, not elsewhere classified: Secondary | ICD-10-CM | POA: Diagnosis not present

## 2015-10-16 DIAGNOSIS — M6281 Muscle weakness (generalized): Secondary | ICD-10-CM | POA: Diagnosis not present

## 2015-10-16 DIAGNOSIS — R269 Unspecified abnormalities of gait and mobility: Secondary | ICD-10-CM | POA: Diagnosis not present

## 2015-10-16 DIAGNOSIS — R262 Difficulty in walking, not elsewhere classified: Secondary | ICD-10-CM | POA: Diagnosis not present

## 2015-10-16 DIAGNOSIS — R600 Localized edema: Secondary | ICD-10-CM | POA: Diagnosis not present

## 2015-10-16 DIAGNOSIS — G547 Phantom limb syndrome without pain: Secondary | ICD-10-CM | POA: Diagnosis not present

## 2015-10-16 DIAGNOSIS — Z89619 Acquired absence of unspecified leg above knee: Secondary | ICD-10-CM | POA: Diagnosis not present

## 2015-11-12 DIAGNOSIS — R633 Feeding difficulties: Secondary | ICD-10-CM | POA: Diagnosis not present

## 2015-11-12 DIAGNOSIS — M159 Polyosteoarthritis, unspecified: Secondary | ICD-10-CM | POA: Diagnosis not present

## 2015-11-12 DIAGNOSIS — I10 Essential (primary) hypertension: Secondary | ICD-10-CM | POA: Diagnosis not present

## 2015-11-12 DIAGNOSIS — R1310 Dysphagia, unspecified: Secondary | ICD-10-CM | POA: Diagnosis not present

## 2015-11-12 DIAGNOSIS — C140 Malignant neoplasm of pharynx, unspecified: Secondary | ICD-10-CM | POA: Diagnosis not present

## 2015-11-12 DIAGNOSIS — R600 Localized edema: Secondary | ICD-10-CM | POA: Diagnosis not present

## 2015-11-12 DIAGNOSIS — J449 Chronic obstructive pulmonary disease, unspecified: Secondary | ICD-10-CM | POA: Diagnosis not present

## 2015-11-12 DIAGNOSIS — E876 Hypokalemia: Secondary | ICD-10-CM | POA: Diagnosis not present

## 2015-11-12 DIAGNOSIS — G629 Polyneuropathy, unspecified: Secondary | ICD-10-CM | POA: Diagnosis not present

## 2015-11-12 DIAGNOSIS — I739 Peripheral vascular disease, unspecified: Secondary | ICD-10-CM | POA: Diagnosis not present

## 2015-11-12 DIAGNOSIS — Z1389 Encounter for screening for other disorder: Secondary | ICD-10-CM | POA: Diagnosis not present

## 2015-11-12 DIAGNOSIS — Z89611 Acquired absence of right leg above knee: Secondary | ICD-10-CM | POA: Diagnosis not present

## 2015-11-12 DIAGNOSIS — Z720 Tobacco use: Secondary | ICD-10-CM | POA: Diagnosis not present

## 2015-11-12 DIAGNOSIS — E7800 Pure hypercholesterolemia, unspecified: Secondary | ICD-10-CM | POA: Diagnosis not present

## 2015-12-28 DIAGNOSIS — R1032 Left lower quadrant pain: Secondary | ICD-10-CM | POA: Diagnosis not present

## 2015-12-28 DIAGNOSIS — R1900 Intra-abdominal and pelvic swelling, mass and lump, unspecified site: Secondary | ICD-10-CM | POA: Diagnosis not present

## 2015-12-29 ENCOUNTER — Emergency Department (HOSPITAL_COMMUNITY): Payer: Medicare Other

## 2015-12-29 ENCOUNTER — Inpatient Hospital Stay (HOSPITAL_COMMUNITY)
Admission: EM | Admit: 2015-12-29 | Discharge: 2016-01-01 | DRG: 272 | Disposition: A | Discharge status: Home / Self Care | Payer: Medicare Other | Attending: Vascular Surgery | Admitting: Vascular Surgery

## 2015-12-29 ENCOUNTER — Encounter (HOSPITAL_COMMUNITY): Payer: Self-pay | Admitting: Emergency Medicine

## 2015-12-29 DIAGNOSIS — I739 Peripheral vascular disease, unspecified: Secondary | ICD-10-CM | POA: Diagnosis present

## 2015-12-29 DIAGNOSIS — E785 Hyperlipidemia, unspecified: Secondary | ICD-10-CM | POA: Diagnosis present

## 2015-12-29 DIAGNOSIS — Z85819 Personal history of malignant neoplasm of unspecified site of lip, oral cavity, and pharynx: Secondary | ICD-10-CM | POA: Diagnosis not present

## 2015-12-29 DIAGNOSIS — R1032 Left lower quadrant pain: Secondary | ICD-10-CM | POA: Diagnosis not present

## 2015-12-29 DIAGNOSIS — I878 Other specified disorders of veins: Secondary | ICD-10-CM

## 2015-12-29 DIAGNOSIS — F17210 Nicotine dependence, cigarettes, uncomplicated: Secondary | ICD-10-CM | POA: Diagnosis present

## 2015-12-29 DIAGNOSIS — Z452 Encounter for adjustment and management of vascular access device: Secondary | ICD-10-CM | POA: Diagnosis not present

## 2015-12-29 DIAGNOSIS — K08109 Complete loss of teeth, unspecified cause, unspecified class: Secondary | ICD-10-CM | POA: Diagnosis present

## 2015-12-29 DIAGNOSIS — Z89611 Acquired absence of right leg above knee: Secondary | ICD-10-CM

## 2015-12-29 DIAGNOSIS — Z801 Family history of malignant neoplasm of trachea, bronchus and lung: Secondary | ICD-10-CM

## 2015-12-29 DIAGNOSIS — Z79899 Other long term (current) drug therapy: Secondary | ICD-10-CM | POA: Diagnosis not present

## 2015-12-29 DIAGNOSIS — Z9002 Acquired absence of larynx: Secondary | ICD-10-CM

## 2015-12-29 DIAGNOSIS — Z23 Encounter for immunization: Secondary | ICD-10-CM

## 2015-12-29 DIAGNOSIS — I724 Aneurysm of artery of lower extremity: Principal | ICD-10-CM | POA: Diagnosis present

## 2015-12-29 DIAGNOSIS — T82898A Other specified complication of vascular prosthetic devices, implants and grafts, initial encounter: Secondary | ICD-10-CM | POA: Diagnosis not present

## 2015-12-29 LAB — CBC WITH DIFFERENTIAL/PLATELET
BASOS PCT: 1 %
Basophils Absolute: 0.1 10*3/uL (ref 0.0–0.1)
EOS PCT: 1 %
Eosinophils Absolute: 0.1 10*3/uL (ref 0.0–0.7)
HCT: 38.7 % (ref 36.0–46.0)
Hemoglobin: 13.1 g/dL (ref 12.0–15.0)
LYMPHS PCT: 11 %
Lymphs Abs: 1.1 10*3/uL (ref 0.7–4.0)
MCH: 28.9 pg (ref 26.0–34.0)
MCHC: 33.9 g/dL (ref 30.0–36.0)
MCV: 85.4 fL (ref 78.0–100.0)
Monocytes Absolute: 1 10*3/uL (ref 0.1–1.0)
Monocytes Relative: 10 %
Neutro Abs: 7.8 10*3/uL — ABNORMAL HIGH (ref 1.7–7.7)
Neutrophils Relative %: 77 %
PLATELETS: 372 10*3/uL (ref 150–400)
RBC: 4.53 MIL/uL (ref 3.87–5.11)
RDW: 15 % (ref 11.5–15.5)
WBC: 10 10*3/uL (ref 4.0–10.5)

## 2015-12-29 LAB — PROTIME-INR
INR: 0.96
Prothrombin Time: 12.8 seconds (ref 11.4–15.2)

## 2015-12-29 LAB — BASIC METABOLIC PANEL
ANION GAP: 11 (ref 5–15)
BUN: 5 mg/dL — AB (ref 6–20)
CALCIUM: 9.8 mg/dL (ref 8.9–10.3)
CO2: 26 mmol/L (ref 22–32)
Chloride: 92 mmol/L — ABNORMAL LOW (ref 101–111)
Creatinine, Ser: 0.55 mg/dL (ref 0.44–1.00)
GFR calc Af Amer: >60 mL/min (ref >60)
GFR calc non Af Amer: >60 mL/min (ref >60)
Glucose, Bld: 113 mg/dL — ABNORMAL HIGH (ref 65–99)
POTASSIUM: 4 mmol/L (ref 3.5–5.1)
SODIUM: 129 mmol/L — AB (ref 135–145)

## 2015-12-29 LAB — URINALYSIS, ROUTINE W REFLEX MICROSCOPIC
BILIRUBIN URINE: NEGATIVE
Glucose, UA: 100 mg/dL — AB
HGB URINE DIPSTICK: NEGATIVE
Ketones, ur: NEGATIVE mg/dL
Nitrite: POSITIVE — AB
PROTEIN: NEGATIVE mg/dL
Specific Gravity, Urine: 1.02 (ref 1.005–1.030)
pH: 7 (ref 5.0–8.0)

## 2015-12-29 LAB — URINE MICROSCOPIC-ADD ON

## 2015-12-29 LAB — SURGICAL PCR SCREEN
MRSA, PCR: NEGATIVE
STAPHYLOCOCCUS AUREUS: POSITIVE — AB

## 2015-12-29 LAB — COMPREHENSIVE METABOLIC PANEL
ALBUMIN: 3.4 g/dL — AB (ref 3.5–5.0)
ALK PHOS: 107 U/L (ref 38–126)
ALT: 13 U/L — AB (ref 14–54)
AST: 25 U/L (ref 15–41)
Anion gap: 12 (ref 5–15)
BUN: 6 mg/dL (ref 6–20)
CALCIUM: 9 mg/dL (ref 8.9–10.3)
CO2: 25 mmol/L (ref 22–32)
CREATININE: 0.62 mg/dL (ref 0.44–1.00)
Chloride: 92 mmol/L — ABNORMAL LOW (ref 101–111)
GFR calc non Af Amer: >60 mL/min (ref >60)
GLUCOSE: 130 mg/dL — AB (ref 65–99)
Potassium: 4 mmol/L (ref 3.5–5.1)
SODIUM: 129 mmol/L — AB (ref 135–145)
Total Bilirubin: 0.4 mg/dL (ref 0.3–1.2)
Total Protein: 6.8 g/dL (ref 6.5–8.1)

## 2015-12-29 LAB — CBC
HCT: 36.4 % (ref 36.0–46.0)
HEMOGLOBIN: 12.1 g/dL (ref 12.0–15.0)
MCH: 28.5 pg (ref 26.0–34.0)
MCHC: 33.2 g/dL (ref 30.0–36.0)
MCV: 85.8 fL (ref 78.0–100.0)
Platelets: 337 10*3/uL (ref 150–400)
RBC: 4.24 MIL/uL (ref 3.87–5.11)
RDW: 15.2 % (ref 11.5–15.5)
WBC: 10.7 10*3/uL — ABNORMAL HIGH (ref 4.0–10.5)

## 2015-12-29 MED ORDER — LABETALOL HCL 5 MG/ML IV SOLN: 10.0000 mg | INTRAVENOUS | Status: DC | PRN

## 2015-12-29 MED ORDER — LISINOPRIL 10 MG PO TABS
20.0000 mg | ORAL_TABLET | Freq: Every day | ORAL | Status: DC
  Administered 2015-12-31 – 2016-01-01 (×2): 20 mg via ORAL
  Filled 2015-12-29 (×2): qty 2

## 2015-12-29 MED ORDER — ASPIRIN EC 81 MG PO TBEC: 81.0000 mg | DELAYED_RELEASE_TABLET | Freq: Every day | ORAL | Status: DC | PRN

## 2015-12-29 MED ORDER — IOPAMIDOL (ISOVUE-370) INJECTION 76%
INTRAVENOUS | Status: AC
  Administered 2015-12-29: 100 mL
  Filled 2015-12-29: qty 100

## 2015-12-29 MED ORDER — CEFAZOLIN SODIUM-DEXTROSE 2-4 GM/100ML-% IV SOLN
2.0000 g | INTRAVENOUS | Status: AC
  Administered 2015-12-30: 2 g via INTRAVENOUS
  Filled 2015-12-29 (×2): qty 100

## 2015-12-29 MED ORDER — CEFAZOLIN SODIUM-DEXTROSE 2-4 GM/100ML-% IV SOLN: 2.0000 g | INTRAVENOUS | Status: DC

## 2015-12-29 MED ORDER — PHENOL 1.4 % MT LIQD: 1.0000 | OROMUCOSAL | Status: DC | PRN

## 2015-12-29 MED ORDER — METOPROLOL TARTRATE 5 MG/5ML IV SOLN: 2.0000 mg | INTRAVENOUS | Status: DC | PRN

## 2015-12-29 MED ORDER — GUAIFENESIN-DM 100-10 MG/5ML PO SYRP: 15.0000 mL | ORAL_SOLUTION | ORAL | Status: DC | PRN

## 2015-12-29 MED ORDER — POTASSIUM CHLORIDE CRYS ER 20 MEQ PO TBCR
20.0000 meq | EXTENDED_RELEASE_TABLET | Freq: Once | ORAL | Status: AC
Start: 2015-12-29 — End: 2015-12-29
  Administered 2015-12-29: 20 meq via ORAL
  Filled 2015-12-29: qty 1

## 2015-12-29 MED ORDER — OXYCODONE-ACETAMINOPHEN 5-325 MG PO TABS
1.0000 | ORAL_TABLET | Freq: Four times a day (QID) | ORAL | Status: DC | PRN
  Administered 2015-12-31 – 2016-01-01 (×2): 1 via ORAL
  Filled 2015-12-29 (×2): qty 1

## 2015-12-29 MED ORDER — PANTOPRAZOLE SODIUM 40 MG PO TBEC
40.0000 mg | DELAYED_RELEASE_TABLET | Freq: Every day | ORAL | Status: DC
  Administered 2015-12-29 – 2016-01-01 (×3): 40 mg via ORAL
  Filled 2015-12-29 (×3): qty 1

## 2015-12-29 MED ORDER — GABAPENTIN 400 MG PO CAPS
400.0000 mg | ORAL_CAPSULE | Freq: Two times a day (BID) | ORAL | Status: DC
  Administered 2015-12-29 – 2016-01-01 (×5): 400 mg via ORAL
  Filled 2015-12-29 (×5): qty 1

## 2015-12-29 MED ORDER — INFLUENZA VAC SPLIT QUAD 0.5 ML IM SUSY: 0.5000 mL | PREFILLED_SYRINGE | INTRAMUSCULAR | Status: DC

## 2015-12-29 MED ORDER — HEPARIN SODIUM (PORCINE) 5000 UNIT/ML IJ SOLN
5000.0000 [IU] | Freq: Three times a day (TID) | INTRAMUSCULAR | Status: DC
  Administered 2015-12-29 – 2016-01-01 (×7): 5000 [IU] via SUBCUTANEOUS
  Filled 2015-12-29 (×7): qty 1

## 2015-12-29 MED ORDER — ALUM & MAG HYDROXIDE-SIMETH 200-200-20 MG/5ML PO SUSP: 15.0000 mL | ORAL | Status: DC | PRN

## 2015-12-29 MED ORDER — MORPHINE SULFATE (PF) 4 MG/ML IV SOLN
4.0000 mg | Freq: Once | INTRAVENOUS | Status: AC
  Administered 2015-12-29: 4 mg via INTRAVENOUS
  Filled 2015-12-29: qty 1

## 2015-12-29 MED ORDER — HYDRALAZINE HCL 20 MG/ML IJ SOLN: 5.0000 mg | INTRAMUSCULAR | Status: DC | PRN

## 2015-12-29 MED ORDER — SODIUM CHLORIDE 0.9 % IV SOLN
INTRAVENOUS | Status: DC
  Administered 2015-12-29: 14:00:00 via INTRAVENOUS

## 2015-12-29 MED ORDER — PNEUMOCOCCAL VAC POLYVALENT 25 MCG/0.5ML IJ INJ: 0.5000 mL | INJECTION | INTRAMUSCULAR | Status: DC

## 2015-12-29 MED ORDER — ONDANSETRON HCL 4 MG/2ML IJ SOLN: 4.0000 mg | Freq: Four times a day (QID) | INTRAMUSCULAR | Status: DC | PRN

## 2015-12-29 NOTE — ED Provider Notes (Signed)
[facility] DEPT Provider Note   CSN: [id number] Arrival date & time: 12/29/15  0111  By signing my name below, I, Soijett Blue, attest that this documentation has been prepared under the direction and in the presence of Veryl Speak, MD. Electronically Signed: Soijett Blue, ED Scribe. 12/29/15. 1:48 AM.   History   Chief Complaint Chief Complaint  Patient presents with  . Aneurysm    HPI  RENNIE HACK is a 70 y.o. female with a PMHx of PAD, throat CA s/p laryngectomy, who presents to the Emergency Department complaining of worsening mass to LLQ onset 1 month. Pt notes that she was seen at [facility] today due to LLQ pain and dx with femoral aneurysm and sent to the ED for further evaluation. She notes that she has not tried any medications for the relief of her symptoms. She denies any other symptoms.     The history is provided by the patient. No language interpreter was used.    Past Medical History:  Diagnosis Date  . Carotid bruit   . Dyslipidemia   . PAD (peripheral artery disease) ([place])   . Pneumonia   . Throat cancer ([place])   . Tobacco dependence     Patient Active Problem List   Diagnosis Date Noted  . PAD (peripheral artery disease) ([place]) 01/05/2011  . Hypercholesterolemia 01/05/2011  . CAD (coronary artery disease) 01/05/2011  . Tobacco abuse 01/05/2011    Past Surgical History:  Procedure Laterality Date  . AMPUTATION Right 12/18/2014   Procedure: AMPUTATION ABOVE KNEE;  Surgeon: Elam Dutch, MD;  Location: [facility];  Service: Vascular;  Laterality: Right;  . AORTA - BILATERAL FEMORAL ARTERY BYPASS GRAFT    . COLONOSCOPY    . LOWER EXTREMITY ANGIOGRAM Bilateral 12/14/2014   Procedure: Lower Extremity Angiogram;  Surgeon: Elam Dutch, MD;  Location: [facility];  Service: Cardiovascular;  Laterality: Bilateral;  . PEG TUBE PLACEMENT    . PEG TUBE REMOVAL    . PERIPHERAL VASCULAR CATHETERIZATION N/A 12/14/2014   Procedure:  Abdominal Aortogram;  Surgeon: Elam Dutch, MD;  Location: [facility];  Service: Cardiovascular;  Laterality: N/A;  . TONSILLECTOMY    . TOTAL LARYNGECTOMY      OB History    No data available       Home Medications    Prior to Admission medications   Medication Sig Start Date End Date Taking? Authorizing Provider  aspirin EC 81 MG tablet Take 81 mg by mouth daily as needed (pain).    Historical Provider, MD  doxycycline (VIBRA-TABS) 100 MG tablet Take 100 mg by mouth 2 (two) times daily.  12/10/14   Historical Provider, MD  furosemide (LASIX) 20 MG tablet Take 20 mg by mouth 3 (three) times daily. Reported on 05/02/2015 11/08/14   Historical Provider, MD  gabapentin (NEURONTIN) 400 MG capsule Take 400 mg by mouth 3 (three) times daily. Reported on 05/02/2015 11/26/14   Historical Provider, MD  KLOR-CON M20 20 MEQ tablet Take 20 mEq by mouth 2 (two) times daily.  12/10/14   Historical Provider, MD  lisinopril (PRINIVIL,ZESTRIL) 20 MG tablet TK 1 T PO D 04/22/15   Historical Provider, MD  meloxicam (MOBIC) 15 MG tablet  02/11/15   Historical Provider, MD  sulfamethoxazole-trimethoprim (BACTRIM DS,SEPTRA DS) 800-160 MG tablet Take 1 tablet by mouth 2 (two) times daily. Reported on 05/02/2015 12/25/14   Historical Provider, MD    Family History Family History  Problem Relation  Age of Onset  . Cancer Mother   . Lung cancer Brother     Social History Social History  Substance Use Topics  . Smoking status: Light Tobacco Smoker    Packs/day: 1.00    Years: 35.00  . Smokeless tobacco: Never Used  . Alcohol use No     Allergies   Review of patient's allergies indicates no known allergies.   Review of Systems Review of Systems  Unable to perform ROS: Patient nonverbal (at baseline s/p laryngectomy)     Physical Exam Updated Vital Signs BP 147/98 (BP Location: Right Arm)   Pulse 105   Temp 98.2 F (36.8 C) (Oral)   Resp 18   SpO2 97%   Physical Exam    Constitutional: She is oriented to person, place, and time. She appears well-developed and well-nourished. No distress.  HENT:  Head: Normocephalic and atraumatic.  Right Ear: Hearing normal.  Left Ear: Hearing normal.  Nose: Nose normal.  Mouth/Throat: Oropharynx is clear and moist and mucous membranes are normal.  Eyes: Conjunctivae and EOM are normal. Pupils are equal, round, and reactive to light.  Neck: Normal range of motion. Neck supple.  Cardiovascular: Normal rate, regular rhythm, S1 normal, S2 normal and normal heart sounds.  Exam reveals no gallop and no friction rub.   No murmur heard. There is a pulsatile mass in the left inguinal region. This area is TTP. DP pulse in left foot is faint.   Pulmonary/Chest: Effort normal and breath sounds normal. No respiratory distress. She exhibits no tenderness.  Abdominal: Soft. Normal appearance and bowel sounds are normal. There is no hepatosplenomegaly. There is no tenderness. There is no rebound, no guarding, no tenderness at McBurney's point and negative Murphy's sign. No hernia.  Musculoskeletal: Normal range of motion.  Right AKA.   Neurological: She is alert and oriented to person, place, and time. She has normal strength. No cranial nerve deficit or sensory deficit. Coordination normal. GCS eye subscore is 4. GCS verbal subscore is 5. GCS motor subscore is 6.  Skin: Skin is warm, dry and intact. No rash noted. No cyanosis.  Psychiatric: She has a normal mood and affect. Her speech is normal and behavior is normal. Thought content normal.  Nursing note and vitals reviewed.    ED Treatments / Results  DIAGNOSTIC STUDIES: Oxygen Saturation is 97% on RA, nl by my interpretation.    COORDINATION OF CARE: 1:47 AM Discussed treatment plan with pt at bedside which includes consult to vascular surgery, CT angio abdomen pelvis, and pt agreed to plan.   Labs (all labs ordered are listed, but only abnormal results are displayed) Labs  Reviewed - No data to display  EKG  EKG Interpretation None       Radiology No results found.  Procedures Procedures (including critical care time)  Medications Ordered in ED Medications - No data to display   Initial Impression / Assessment and Plan / ED Course  I have reviewed the triage vital signs and the nursing notes.  Pertinent labs & imaging results that were available during my care of the patient were reviewed by me and considered in my medical decision making (see chart for details).  Clinical Course    Patient sent from [facility] for evaluation of a mass in the left groin that was felt to be a pseudoaneurysm. She was accepted in transfer by the receiving ER doctor and Dr. Donzetta Matters from vascular surgery. She is to undergo a CT NGO of  the pelvis. This was performed and reveals a large pseudoaneurysm in the left groin. Dr. Donzetta Matters has been made aware of this finding and will evaluate patient in the emergency department.  Final Clinical Impressions(s) / ED Diagnoses   Final diagnoses:  None    New Prescriptions New Prescriptions   No medications on file   I personally performed the services described in this documentation, which was scribed in my presence. The recorded information has been reviewed and is accurate.        Veryl Speak, MD 12/29/15 [phone]

## 2015-12-29 NOTE — Progress Notes (Signed)
Blood bank states that patient has antibodies and there will be a delay in obtaining blood products.

## 2015-12-29 NOTE — ED Triage Notes (Signed)
Pt had presented to [facility] today with complaint of LLQ abdominal pain.  Diagnosed at that facility with femoral aneurysm and transferred to [facility] for further evaluation and treatment. Pt reports no pain at this time.  Pt is unable to speak at baseline.

## 2015-12-29 NOTE — H&P (Signed)
Hospital Consult    Reason for Consult:  Left groin pain Referring Physician:  ED Stark Jock) MRN #:  [id number]  History of Present Illness: This is a 70 y.o. female with history of AoBF in 2010 with RLE bypass. She has subsequent occlusion of the graft and R AKA. She now presents with 1 month history of left groin pain and swelling. The pain is worse at night and exacerbated with with certain positioning. Her husband noted that the mass possibly enlarged yesterday which prompted ED visit. She is now s/p CT scan. She is comfortable in minimal pain at time of interview. Interview limited by previous laryngectomy.  Past Medical History:  Diagnosis Date  . Carotid bruit   . Dyslipidemia   . PAD (peripheral artery disease) ([place])   . Pneumonia   . Throat cancer ([place])   . Tobacco dependence     Past Surgical History:  Procedure Laterality Date  . AMPUTATION Right 12/18/2014   Procedure: AMPUTATION ABOVE KNEE;  Surgeon: Elam Dutch, MD;  Location: [facility];  Service: Vascular;  Laterality: Right;  . AORTA - BILATERAL FEMORAL ARTERY BYPASS GRAFT    . COLONOSCOPY    . LOWER EXTREMITY ANGIOGRAM Bilateral 12/14/2014   Procedure: Lower Extremity Angiogram;  Surgeon: Elam Dutch, MD;  Location: [facility];  Service: Cardiovascular;  Laterality: Bilateral;  . PEG TUBE PLACEMENT    . PEG TUBE REMOVAL    . PERIPHERAL VASCULAR CATHETERIZATION N/A 12/14/2014   Procedure: Abdominal Aortogram;  Surgeon: Elam Dutch, MD;  Location: [facility];  Service: Cardiovascular;  Laterality: N/A;  . TONSILLECTOMY    . TOTAL LARYNGECTOMY      No Known Allergies  Prior to Admission medications   Medication Sig Start Date End Date Taking? Authorizing Provider  aspirin EC 81 MG tablet Take 81 mg by mouth daily as needed (pain).   Yes Historical Provider, MD  aspirin-sod bicarb-citric acid (ALKA-SELTZER) 325 MG TBEF tablet Take 325 mg by mouth every 6 (six) hours as needed (for  indigestion).   Yes Historical Provider, MD  gabapentin (NEURONTIN) 400 MG capsule Take 400 mg by mouth See admin instructions. Two to three times a day 11/26/14  Yes Historical Provider, MD  KLOR-CON M20 20 MEQ tablet Take 20 mEq by mouth 2 (two) times daily.  12/10/14  Yes Historical Provider, MD  lisinopril (PRINIVIL,ZESTRIL) 20 MG tablet Take 20 mg by mouth once a day 04/22/15  Yes Historical Provider, MD  meloxicam (MOBIC) 7.5 MG tablet Take 7.5 mg by mouth 2 (two) times daily as needed for pain.   Yes Historical Provider, MD    Social History   Social History  . Marital status: Married    Spouse name: N/A  . Number of children: N/A  . Years of education: N/A   Occupational History  . Not on file.   Social History Main Topics  . Smoking status: Light Tobacco Smoker    Packs/day: 1.00    Years: 35.00  . Smokeless tobacco: Never Used  . Alcohol use No  . Drug use: No  . Sexual activity: Not on file   Other Topics Concern  . Not on file   Social History Narrative  . No narrative on file    Family History  Problem Relation Age of Onset  . Cancer Mother   . Lung cancer Brother     ROS: limited by previous laryngectomy. Specifically she denies any recent fevers or chills, continues  to eat as tolerated with normal bowel and bladder habits, no history of clotting disorder and does not take blood thinner, ambulates with RLE prosthesis at home  Physical Examination  Vitals:   12/29/15 0415 12/29/15 0545  BP: 114/77   Pulse: 97 93  Resp: 25 10  Temp:     There is no height or weight on file to calculate BMI.  General:  WDWN in NAD Gait: Not observed HENT: stoma midline neck Pulmonary: normal non-labored breathing Cardiac: rrr Abdomen: soft, NT/ND, no masses Extremities: Left groin with large palpable mass, no surrounding ecchymosis or erythema. Well healed incision overlying. Palpable dp pulse Musculoskeletal: no muscle wasting or atrophy  Neurologic: A&O X 3;  Appropriate Affect ; SENSATION: normal; MOTOR FUNCTION:  moving all extremities equally. Speech is fluent/normal  CBC    Component Value Date/Time   WBC 10.0 12/29/2015 0151   RBC 4.53 12/29/2015 0151   HGB 13.1 12/29/2015 0151   HCT 38.7 12/29/2015 0151   PLT 372 12/29/2015 0151   MCV 85.4 12/29/2015 0151   MCH 28.9 12/29/2015 0151   MCHC 33.9 12/29/2015 0151   RDW 15.0 12/29/2015 0151   LYMPHSABS 1.1 12/29/2015 0151   MONOABS 1.0 12/29/2015 0151   EOSABS 0.1 12/29/2015 0151   BASOSABS 0.1 12/29/2015 0151    BMET    Component Value Date/Time   NA 129 (L) 12/29/2015 0151   K 4.0 12/29/2015 0151   CL 92 (L) 12/29/2015 0151   CO2 26 12/29/2015 0151   GLUCOSE 113 (H) 12/29/2015 0151   BUN 5 (L) 12/29/2015 0151   CREATININE 0.55 12/29/2015 0151   CALCIUM 9.8 12/29/2015 0151   GFRNONAA >60 12/29/2015 0151   GFRAA >60 12/29/2015 0151    COAGS: Lab Results  Component Value Date   INR 1.7 (H) 08/09/2008   INR 1.5 08/09/2008   INR 1.0 08/09/2008     Non-Invasive Vascular Imaging:   IMPRESSION: VASCULAR  1. Large aneurysm/pseudoaneurysm in the left femoral artery measuring 6.5 x 5.0 x 7.3 cm. 2. Post aortic bypass with occlusion/non opacification of the right common, internal and external iliac and superficial femoral arteries. 3. Advanced calcified and noncalcified atheromatous plaque throughout.  NON-VASCULAR  1. Bilateral hydroureteronephrosis, without identifiable cause or urolithiasis. Left ureter transition point the pelvic brim, right ureter transition point in the proximal ureter. Heterogeneity of the left kidney with ureteric enhancement raises possibility of urinary tract infection. 2. Medial left lung base 2 cm nodular consolidation with peripheral vascularity, margins are regular. This is only partially included in the field of view. This is not described on CT from 2012. Chronic scarring, focal pneumonia, or neoplastic etiology are  considered. The adjacent bronchus may be occluded. These results were called by telephone at the time of interpretation on 12/29/2015 at 5:42 am to Dr. Veryl Speak , who verbally acknowledged these results.    ASSESSMENT/PLAN: This is a 70 y.o. female with previous AoBF now with 1 month history of enlarging psa in left groin, R limb is occluded. No signs of infection on exam today or by history. Will need surgical repair and will plan for tomorrow. I have discussed risks and benefits including risks of cardiopulmonary events, significant blood loss requiring transfusion and possibility that graft could be infected requiring further intervention. If there is suspicion of rupture in the next 24 would require emergent intervention, otherwise npo at midnight with plan for surgery tomorrow.  Raseel Jans C. Donzetta Matters, MD [facility] Office:  [phone] Pager: [phone]

## 2015-12-29 NOTE — ED Notes (Signed)
Pt requesting a time frame for imaging.  CT contacted, pt next for scan

## 2015-12-30 ENCOUNTER — Inpatient Hospital Stay (HOSPITAL_COMMUNITY): Payer: Medicare Other

## 2015-12-30 ENCOUNTER — Inpatient Hospital Stay (HOSPITAL_COMMUNITY): Payer: Medicare Other | Admitting: Certified Registered Nurse Anesthetist

## 2015-12-30 ENCOUNTER — Encounter (HOSPITAL_COMMUNITY)
Admission: EM | Disposition: A | Discharge status: Home / Self Care | Payer: Self-pay | Source: Home / Self Care | Attending: Vascular Surgery

## 2015-12-30 ENCOUNTER — Encounter (HOSPITAL_COMMUNITY): Payer: Self-pay | Admitting: Certified Registered Nurse Anesthetist

## 2015-12-30 DIAGNOSIS — T82898A Other specified complication of vascular prosthetic devices, implants and grafts, initial encounter: Secondary | ICD-10-CM

## 2015-12-30 HISTORY — PX: FALSE ANEURYSM REPAIR: SHX5152

## 2015-12-30 HISTORY — PX: FEMORAL-FEMORAL BYPASS GRAFT: SHX936

## 2015-12-30 LAB — CBC
HCT: 34.9 % — ABNORMAL LOW (ref 36.0–46.0)
HEMOGLOBIN: 11.4 g/dL — AB (ref 12.0–15.0)
MCH: 28.4 pg (ref 26.0–34.0)
MCHC: 32.7 g/dL (ref 30.0–36.0)
MCV: 86.8 fL (ref 78.0–100.0)
Platelets: 331 10*3/uL (ref 150–400)
RBC: 4.02 MIL/uL (ref 3.87–5.11)
RDW: 15.4 % (ref 11.5–15.5)
WBC: 8.3 10*3/uL (ref 4.0–10.5)

## 2015-12-30 LAB — BASIC METABOLIC PANEL
Anion gap: 8 (ref 5–15)
CHLORIDE: 99 mmol/L — AB (ref 101–111)
CO2: 25 mmol/L (ref 22–32)
CREATININE: 0.51 mg/dL (ref 0.44–1.00)
Calcium: 8.8 mg/dL — ABNORMAL LOW (ref 8.9–10.3)
GFR calc Af Amer: >60 mL/min (ref >60)
GFR calc non Af Amer: >60 mL/min (ref >60)
Glucose, Bld: 127 mg/dL — ABNORMAL HIGH (ref 65–99)
Potassium: 3.8 mmol/L (ref 3.5–5.1)
SODIUM: 132 mmol/L — AB (ref 135–145)

## 2015-12-30 SURGERY — REPAIR, PSEUDOANEURYSM
Anesthesia: General | Laterality: Left

## 2015-12-30 MED ORDER — FENTANYL CITRATE (PF) 100 MCG/2ML IJ SOLN
INTRAMUSCULAR | Status: AC
  Filled 2015-12-30: qty 2

## 2015-12-30 MED ORDER — PROTAMINE SULFATE 10 MG/ML IV SOLN
INTRAVENOUS | Status: DC | PRN
  Administered 2015-12-30: 30 mg via INTRAVENOUS

## 2015-12-30 MED ORDER — PHENYLEPHRINE HCL 10 MG/ML IJ SOLN
INTRAMUSCULAR | Status: DC | PRN
  Administered 2015-12-30 (×2): 120 ug via INTRAVENOUS

## 2015-12-30 MED ORDER — SODIUM CHLORIDE 0.9 % IV SOLN: 500.0000 mL | Freq: Once | INTRAVENOUS | Status: DC | PRN

## 2015-12-30 MED ORDER — PROPOFOL 10 MG/ML IV BOLUS
INTRAVENOUS | Status: DC | PRN
  Administered 2015-12-30: 50 mg via INTRAVENOUS
  Administered 2015-12-30: 100 mg via INTRAVENOUS

## 2015-12-30 MED ORDER — 0.9 % SODIUM CHLORIDE (POUR BTL) OPTIME
TOPICAL | Status: DC | PRN
  Administered 2015-12-30: 2000 mL

## 2015-12-30 MED ORDER — DOCUSATE SODIUM 100 MG PO CAPS
100.0000 mg | ORAL_CAPSULE | Freq: Every day | ORAL | Status: DC
  Administered 2016-01-01: 100 mg via ORAL
  Filled 2015-12-30 (×2): qty 1

## 2015-12-30 MED ORDER — ACETAMINOPHEN 325 MG RE SUPP: 325.0000 mg | RECTAL | Status: DC | PRN

## 2015-12-30 MED ORDER — FENTANYL CITRATE (PF) 100 MCG/2ML IJ SOLN: 25.0000 ug | INTRAMUSCULAR | Status: DC | PRN

## 2015-12-30 MED ORDER — MORPHINE SULFATE (PF) 2 MG/ML IV SOLN: 2.0000 mg | INTRAVENOUS | Status: DC | PRN

## 2015-12-30 MED ORDER — LIDOCAINE 2% (20 MG/ML) 5 ML SYRINGE
INTRAMUSCULAR | Status: AC
  Filled 2015-12-30: qty 5

## 2015-12-30 MED ORDER — ONDANSETRON HCL 4 MG/2ML IJ SOLN
INTRAMUSCULAR | Status: DC | PRN
  Administered 2015-12-30: 4 mg via INTRAVENOUS

## 2015-12-30 MED ORDER — LACTATED RINGERS IV SOLN
INTRAVENOUS | Status: DC
  Administered 2015-12-30 (×4): via INTRAVENOUS

## 2015-12-30 MED ORDER — ALBUMIN HUMAN 5 % IV SOLN
INTRAVENOUS | Status: DC | PRN
  Administered 2015-12-30 (×2): via INTRAVENOUS

## 2015-12-30 MED ORDER — DEXAMETHASONE SODIUM PHOSPHATE 4 MG/ML IJ SOLN
INTRAMUSCULAR | Status: DC | PRN
  Administered 2015-12-30: 10 mg via INTRAVENOUS

## 2015-12-30 MED ORDER — POLYETHYLENE GLYCOL 3350 17 G PO PACK: 17.0000 g | PACK | Freq: Every day | ORAL | Status: DC | PRN

## 2015-12-30 MED ORDER — MIDAZOLAM HCL 2 MG/2ML IJ SOLN
INTRAMUSCULAR | Status: AC
  Filled 2015-12-30: qty 2

## 2015-12-30 MED ORDER — HEPARIN SODIUM (PORCINE) 5000 UNIT/ML IJ SOLN
INTRAMUSCULAR | Status: DC | PRN
  Administered 2015-12-30: 11:00:00

## 2015-12-30 MED ORDER — OXYCODONE HCL 5 MG PO TABS: 5.0000 mg | ORAL_TABLET | Freq: Once | ORAL | Status: DC | PRN

## 2015-12-30 MED ORDER — PHENYLEPHRINE HCL 10 MG/ML IJ SOLN
INTRAVENOUS | Status: DC | PRN
  Administered 2015-12-30: 80 ug/min via INTRAVENOUS

## 2015-12-30 MED ORDER — POTASSIUM CHLORIDE CRYS ER 20 MEQ PO TBCR: 20.0000 meq | EXTENDED_RELEASE_TABLET | Freq: Every day | ORAL | Status: DC | PRN

## 2015-12-30 MED ORDER — PHENYLEPHRINE 40 MCG/ML (10ML) SYRINGE FOR IV PUSH (FOR BLOOD PRESSURE SUPPORT)
PREFILLED_SYRINGE | INTRAVENOUS | Status: AC
  Filled 2015-12-30: qty 10

## 2015-12-30 MED ORDER — ACETAMINOPHEN 325 MG PO TABS: 325.0000 mg | ORAL_TABLET | ORAL | Status: DC | PRN

## 2015-12-30 MED ORDER — ONDANSETRON HCL 4 MG/2ML IJ SOLN: 4.0000 mg | Freq: Once | INTRAMUSCULAR | Status: DC | PRN

## 2015-12-30 MED ORDER — FENTANYL CITRATE (PF) 100 MCG/2ML IJ SOLN
INTRAMUSCULAR | Status: DC | PRN
  Administered 2015-12-30 (×2): 50 ug via INTRAVENOUS
  Administered 2015-12-30: 100 ug via INTRAVENOUS
  Administered 2015-12-30: 50 ug via INTRAVENOUS
  Administered 2015-12-30 (×2): 100 ug via INTRAVENOUS

## 2015-12-30 MED ORDER — LIDOCAINE HCL (CARDIAC) 20 MG/ML IV SOLN
INTRAVENOUS | Status: DC | PRN
  Administered 2015-12-30: 40 mg via INTRAVENOUS

## 2015-12-30 MED ORDER — MAGNESIUM SULFATE 2 GM/50ML IV SOLN
2.0000 g | Freq: Every day | INTRAVENOUS | Status: DC | PRN
  Filled 2015-12-30: qty 50

## 2015-12-30 MED ORDER — MIDAZOLAM HCL 5 MG/5ML IJ SOLN
INTRAMUSCULAR | Status: DC | PRN
  Administered 2015-12-30: 2 mg via INTRAVENOUS

## 2015-12-30 MED ORDER — SUGAMMADEX SODIUM 200 MG/2ML IV SOLN
INTRAVENOUS | Status: DC | PRN
  Administered 2015-12-30: 150 mg via INTRAVENOUS

## 2015-12-30 MED ORDER — PROTAMINE SULFATE 10 MG/ML IV SOLN
INTRAVENOUS | Status: AC
  Filled 2015-12-30: qty 5

## 2015-12-30 MED ORDER — FENTANYL CITRATE (PF) 100 MCG/2ML IJ SOLN
INTRAMUSCULAR | Status: AC
  Filled 2015-12-30: qty 4

## 2015-12-30 MED ORDER — HEPARIN SODIUM (PORCINE) 1000 UNIT/ML IJ SOLN
INTRAMUSCULAR | Status: AC
  Filled 2015-12-30: qty 1

## 2015-12-30 MED ORDER — SUCCINYLCHOLINE CHLORIDE 200 MG/10ML IV SOSY
PREFILLED_SYRINGE | INTRAVENOUS | Status: AC
  Filled 2015-12-30: qty 10

## 2015-12-30 MED ORDER — ONDANSETRON HCL 4 MG/2ML IJ SOLN
INTRAMUSCULAR | Status: AC
  Filled 2015-12-30: qty 2

## 2015-12-30 MED ORDER — ROCURONIUM BROMIDE 100 MG/10ML IV SOLN
INTRAVENOUS | Status: DC | PRN
  Administered 2015-12-30: 40 mg via INTRAVENOUS
  Administered 2015-12-30: 20 mg via INTRAVENOUS

## 2015-12-30 MED ORDER — OXYCODONE HCL 5 MG/5ML PO SOLN: 5.0000 mg | Freq: Once | ORAL | Status: DC | PRN

## 2015-12-30 MED ORDER — HEPARIN SODIUM (PORCINE) 1000 UNIT/ML IJ SOLN
INTRAMUSCULAR | Status: DC | PRN
  Administered 2015-12-30: 4 mL via INTRAVENOUS

## 2015-12-30 MED ORDER — DEXAMETHASONE SODIUM PHOSPHATE 10 MG/ML IJ SOLN
INTRAMUSCULAR | Status: AC
  Filled 2015-12-30: qty 1

## 2015-12-30 MED ORDER — DEXTROSE 5 % IV SOLN
1.5000 g | Freq: Two times a day (BID) | INTRAVENOUS | Status: AC
  Administered 2015-12-30 – 2015-12-31 (×2): 1.5 g via INTRAVENOUS
  Filled 2015-12-30 (×2): qty 1.5

## 2015-12-30 SURGICAL SUPPLY — 43 items
CANISTER SUCTION 2500CC (MISCELLANEOUS) ×3 IMPLANT
CATH EMB 4FR 40CM (CATHETERS) ×6 IMPLANT
CLIP TI MEDIUM 24 (CLIP) ×3 IMPLANT
CLIP TI WIDE RED SMALL 24 (CLIP) ×3 IMPLANT
DERMABOND ADVANCED (GAUZE/BANDAGES/DRESSINGS) ×2
DERMABOND ADVANCED .7 DNX12 (GAUZE/BANDAGES/DRESSINGS) ×1 IMPLANT
DRAIN CHANNEL 15F RND FF W/TCR (WOUND CARE) IMPLANT
ELECT BLADE 4.0 EZ CLEAN MEGAD (MISCELLANEOUS) ×3
ELECT REM PT RETURN 9FT ADLT (ELECTROSURGICAL) ×3
ELECTRODE BLDE 4.0 EZ CLN MEGD (MISCELLANEOUS) ×1 IMPLANT
ELECTRODE REM PT RTRN 9FT ADLT (ELECTROSURGICAL) ×1 IMPLANT
EVACUATOR SILICONE 100CC (DRAIN) IMPLANT
GLOVE BIO SURGEON STRL SZ7.5 (GLOVE) ×3 IMPLANT
GLOVE BIOGEL PI IND STRL 6.5 (GLOVE) ×1 IMPLANT
GLOVE BIOGEL PI IND STRL 7.0 (GLOVE) ×1 IMPLANT
GLOVE BIOGEL PI INDICATOR 6.5 (GLOVE) ×2
GLOVE BIOGEL PI INDICATOR 7.0 (GLOVE) ×2
GLOVE ECLIPSE 6.5 STRL STRAW (GLOVE) ×3 IMPLANT
GLOVE SURG SS PI 6.5 STRL IVOR (GLOVE) ×3 IMPLANT
GOWN STRL REUS W/ TWL LRG LVL3 (GOWN DISPOSABLE) ×2 IMPLANT
GOWN STRL REUS W/ TWL XL LVL3 (GOWN DISPOSABLE) ×1 IMPLANT
GOWN STRL REUS W/TWL LRG LVL3 (GOWN DISPOSABLE) ×4
GOWN STRL REUS W/TWL XL LVL3 (GOWN DISPOSABLE) ×2
GRAFT HEMASHIELD 8MM (Vascular Products) ×2 IMPLANT
GRAFT VASC STRG 30X8KNIT (Vascular Products) ×1 IMPLANT
INSERT FOGARTY SM (MISCELLANEOUS) ×3 IMPLANT
KIT BASIN OR (CUSTOM PROCEDURE TRAY) ×3 IMPLANT
KIT ROOM TURNOVER OR (KITS) ×3 IMPLANT
LIQUID BAND (GAUZE/BANDAGES/DRESSINGS) ×3 IMPLANT
NS IRRIG 1000ML POUR BTL (IV SOLUTION) ×6 IMPLANT
PACK PERIPHERAL VASCULAR (CUSTOM PROCEDURE TRAY) ×3 IMPLANT
PAD ARMBOARD 7.5X6 YLW CONV (MISCELLANEOUS) ×6 IMPLANT
STOPCOCK 4 WAY LG BORE MALE ST (IV SETS) ×6 IMPLANT
SUT MNCRL AB 4-0 PS2 18 (SUTURE) ×3 IMPLANT
SUT PROLENE 5 0 C 1 24 (SUTURE) ×12 IMPLANT
SUT PROLENE 6 0 BV (SUTURE) ×3 IMPLANT
SUT VIC AB 2-0 CT1 27 (SUTURE) ×6
SUT VIC AB 2-0 CT1 TAPERPNT 27 (SUTURE) ×3 IMPLANT
SUT VIC AB 3-0 SH 27 (SUTURE) ×4
SUT VIC AB 3-0 SH 27X BRD (SUTURE) ×2 IMPLANT
SYRINGE 3CC LL L/F (MISCELLANEOUS) ×6 IMPLANT
TRAY FOLEY W/METER SILVER 16FR (SET/KITS/TRAYS/PACK) ×3 IMPLANT
WATER STERILE IRR 1000ML POUR (IV SOLUTION) ×3 IMPLANT

## 2015-12-30 NOTE — Anesthesia Preprocedure Evaluation (Signed)
Anesthesia Evaluation  Patient identified by MRN, date of birth, ID band Patient awake    Reviewed: Allergy & Precautions, NPO status , Patient's Chart, lab work & pertinent test results  Airway Mallampati: Trach       Dental  (+) Edentulous Upper, Edentulous Lower   Pulmonary Current Smoker,    breath sounds clear to auscultation       Cardiovascular  Rhythm:Regular Rate:Normal     Neuro/Psych    GI/Hepatic   Endo/Other    Renal/GU      Musculoskeletal   Abdominal   Peds  Hematology   Anesthesia Other Findings   Reproductive/Obstetrics                             Anesthesia Physical Anesthesia Plan  ASA: III  Anesthesia Plan: General   Post-op Pain Management:    Induction: Intravenous  Airway Management Planned: Tracheostomy  Additional Equipment: Arterial line and CVP  Intra-op Plan:   Post-operative Plan:   Informed Consent: I have reviewed the patients History and Physical, chart, labs and discussed the procedure including the risks, benefits and alternatives for the proposed anesthesia with the patient or authorized representative who has indicated his/her understanding and acceptance.     Plan Discussed with: CRNA and Anesthesiologist  Anesthesia Plan Comments:         Anesthesia Quick Evaluation

## 2015-12-30 NOTE — Progress Notes (Signed)
18 g in right distal forearm extrasated 15 cc of saline

## 2015-12-30 NOTE — Op Note (Addendum)
Patient name: Heather Jimenez MRN: [id number] DOB: 11/11/1945 Sex: female 12/30/2015 Pre-operative Diagnosis: pseudoaneurysm left aorto-femoral bypass  Post-operative diagnosis:  Same Surgeon:  Erlene Quan C. Donzetta Matters, MD Assistant: Rosetta Posner, MD Procedure Performed: 1.  Excision of left femoral pseudoaneurysm 2.  Bypass from left limb aortofemoral graft to left common femoral artery with 63m dacron  Indications:  70year old white female with history of aortobifemoral bypass. She now presents with one-month history of swelling and left groin with pulsatile mass. CT demonstrates pseudoaneurysm from the left femoral bypass anastomosis. She is therefore indicated for the above procedure.  Findings: There is a large pseudoaneurysm extending underneath the inguinal ligament down to the level of the SFA and profunda femoris arteries. The medial aspect of the anastomosis had dislodged leading to this large pseudoaneurysm. We did have a venous injury at the level of the external iliac which resolved with direct pressure. Completion of the procedure patient had her peroneal signal at the level of the foot there was a bypass from the aortofemoral to the common femoral artery with 8 mm dacron.   Procedure:  The patient was identified correctly and taken to the operating room placed supine on the operating table monitoring lines placed by anesthesia sterilely prepped and draped in usual fashion timeout called. She was given antibiotics. Made an incision overlying her previous aortofemoral bypass incision in the left groin. We dissected down onto the level of the pseudoaneurysm and then dissected free cephalad up under the inguinal ligament. Above the inguinal ligament we're able to identify the dacron graft. We tented to get around the graft with right angle clamp but had a venous injury. At this time we Allowed the venous injury turned our attention distally. We raised flaps overlying the pseudoaneurysm medially  and laterally and inferiorly. Inferiorly we were able to identify the SFA placed a vessel loop around it. We are unable to identify the profunda femoris artery. The patient was heparinized with 4000 units of heparin we then clamped our inflow dacryon graft in R SFA and using electrocautery opened our pseudoaneurysm cavity. We did identify mostly fresh thrombus but also usual grumace of an aneurysm cavity. This was all removed pseudoaneurysm cavity was trimmed back. We moved our clamp from our up inguinal ligament to inside of the pseudoaneurysm cavity. We controlled the profunda femoris backbleeding with a 4 Fogarty balloon inflated. We brought a 836mdacryon graft to the table spatulated and sewed it end to side to the common femoral artery using 5-0 Prolene suture. We allowed this to backbleed did require one repair suture. Clamped the graft at the level of the anastomosis trimmed to size trimmed our inflow graft to size and inserted them and and with 5-0 Prolene suture as well. Prior to completing anastomosis we allowed fore and backbleeding and flushed with heparinized saline. Upon releasing our clamps we had a peroneal signal consistent with preoperative exam. Patient was given 20 mg of protamine which she tolerated well. We then irrigated the wound closed our to aneurysm cavity over our graft with 2-0 Vicryl and then closed the wound in layers with 2-0 vicryl and interrupted layer of 3-0 Vicryl. For Monocryl was placed level skin with Dermabond above that. Patient is laterally from anesthesia having tolerated the procedure well. She was transferred PACU with plans for return to her room.  Blood loss: 600cc  2 units blood transfused    Mikayla Chiusano C. CaDonzetta MattersMD [facility] [place]: [phone]: [phone]

## 2015-12-30 NOTE — Anesthesia Procedure Notes (Signed)
Procedure Name: Intubation Date/Time: 12/30/2015 10:21 AM Performed by: Ollen Bowl Pre-anesthesia Checklist: Patient identified, Emergency Drugs available, Suction available, Patient being monitored and Timeout performed Patient Re-evaluated:Patient Re-evaluated prior to inductionOxygen Delivery Method: Circle system utilized and Simple face mask Preoxygenation: Pre-oxygenation with 100% oxygen Intubation Type: IV induction and Tracheostomy Tube type: Reinforced Tube size: 6.5 mm Number of attempts: 1 Airway Equipment and Method: Patient positioned with wedge pillow Placement Confirmation: positive ETCO2 and breath sounds checked- equal and bilateral Tube secured with: Tape Dental Injury: Teeth and Oropharynx as per pre-operative assessment  Comments: 6.5 Reinforced tube inserted into existing trach site without difficulty

## 2015-12-30 NOTE — Progress Notes (Signed)
  Progress Note    12/30/2015 9:08 AM Day of Surgery  Subjective:  No issues this a.m.  Vitals:   12/29/15 2030 12/30/15 0419  BP: (!) 106/54 100/64  Pulse: 99 95  Resp: 16 18  Temp: 99.4 F (37.4 C) 99.4 F (37.4 C)    Physical Exam: aaox3 rrr Abdomen is soft, nt Palpable mass in left groin Left foot is warm, previous R aka  CBC    Component Value Date/Time   WBC 8.3 12/30/2015 0214   RBC 4.02 12/30/2015 0214   HGB 11.4 (L) 12/30/2015 0214   HCT 34.9 (L) 12/30/2015 0214   PLT 331 12/30/2015 0214   MCV 86.8 12/30/2015 0214   MCH 28.4 12/30/2015 0214   MCHC 32.7 12/30/2015 0214   RDW 15.4 12/30/2015 0214   LYMPHSABS 1.1 12/29/2015 0151   MONOABS 1.0 12/29/2015 0151   EOSABS 0.1 12/29/2015 0151   BASOSABS 0.1 12/29/2015 0151    BMET    Component Value Date/Time   NA 132 (L) 12/30/2015 0214   K 3.8 12/30/2015 0214   CL 99 (L) 12/30/2015 0214   CO2 25 12/30/2015 0214   GLUCOSE 127 (H) 12/30/2015 0214   BUN <5 (L) 12/30/2015 0214   CREATININE 0.51 12/30/2015 0214   CALCIUM 8.8 (L) 12/30/2015 0214   GFRNONAA >60 12/30/2015 0214   GFRAA >60 12/30/2015 0214    INR    Component Value Date/Time   INR 0.96 12/29/2015 1450     Intake/Output Summary (Last 24 hours) at 12/30/15 0908 Last data filed at 12/30/15 0500  Gross per 24 hour  Intake             1590 ml  Output                0 ml  Net             1590 ml     Assessment:  70 y.o. female is here with left femoral pseudoaneurysm from previous aortobifemoral bypass  Plan: OR today for open repair of left femoral pseudoaneurysm Consent on chart   March Steyer C. Donzetta Matters, MD [facility] Office: [phone] Pager: [phone]  12/30/2015 9:08 AM

## 2015-12-30 NOTE — Transfer of Care (Signed)
Immediate Anesthesia Transfer of Care Note  Patient: Heather Jimenez  Procedure(s) Performed: Procedure(s): Excision of Left FEMORAL PSEUDO-ANEURYSM (Left) BYPASS From Aorta-Femoral to Femoral Bypass Graft using Dacron Graft (Left)  Patient Location: PACU  Anesthesia Type:General  Level of Consciousness: awake, alert  and oriented  Airway & Oxygen Therapy: Patient Spontanous Breathing and Patient connected to face mask oxygen  Post-op Assessment: Report given to RN and Post -op Vital signs reviewed and stable  Post vital signs: Reviewed and stable  Last Vitals:  Vitals:   12/30/15 0419 12/30/15 1337  BP: 100/64   Pulse: 95   Resp: 18   Temp: 37.4 C (P) 36.4 C    Last Pain:  Vitals:   12/30/15 0419  TempSrc: Oral  PainSc:       Patients Stated Pain Goal: 0 (38/75/64 3329)  Complications: No apparent anesthesia complications

## 2015-12-31 ENCOUNTER — Encounter (HOSPITAL_COMMUNITY): Payer: Self-pay | Admitting: Vascular Surgery

## 2015-12-31 LAB — BASIC METABOLIC PANEL
ANION GAP: 8 (ref 5–15)
Anion gap: 8 (ref 5–15)
BUN: <5 mg/dL — ABNORMAL LOW (ref 6–20)
BUN: 5 mg/dL — AB (ref 6–20)
CALCIUM: 8.2 mg/dL — AB (ref 8.9–10.3)
CALCIUM: 8.1 mg/dL — AB (ref 8.9–10.3)
CO2: 22 mmol/L (ref 22–32)
CO2: 24 mmol/L (ref 22–32)
CREATININE: 0.55 mg/dL (ref 0.44–1.00)
Chloride: 104 mmol/L (ref 101–111)
Chloride: 106 mmol/L (ref 101–111)
Creatinine, Ser: 0.73 mg/dL (ref 0.44–1.00)
GFR calc Af Amer: >60 mL/min (ref >60)
GFR calc non Af Amer: >60 mL/min (ref >60)
GLUCOSE: 287 mg/dL — AB (ref 65–99)
Glucose, Bld: 188 mg/dL — ABNORMAL HIGH (ref 65–99)
Potassium: 3.7 mmol/L (ref 3.5–5.1)
Potassium: 4.2 mmol/L (ref 3.5–5.1)
SODIUM: 134 mmol/L — AB (ref 135–145)
Sodium: 138 mmol/L (ref 135–145)

## 2015-12-31 LAB — CBC
HCT: 32.6 % — ABNORMAL LOW (ref 36.0–46.0)
HCT: 34 % — ABNORMAL LOW (ref 36.0–46.0)
Hemoglobin: 11.1 g/dL — ABNORMAL LOW (ref 12.0–15.0)
Hemoglobin: 11.5 g/dL — ABNORMAL LOW (ref 12.0–15.0)
MCH: 29.5 pg (ref 26.0–34.0)
MCH: 29.9 pg (ref 26.0–34.0)
MCHC: 33.8 g/dL (ref 30.0–36.0)
MCHC: 34 g/dL (ref 30.0–36.0)
MCV: 86.7 fL (ref 78.0–100.0)
MCV: 88.3 fL (ref 78.0–100.0)
PLATELETS: 230 10*3/uL (ref 150–400)
PLATELETS: 199 10*3/uL (ref 150–400)
RBC: 3.76 MIL/uL — AB (ref 3.87–5.11)
RBC: 3.85 MIL/uL — ABNORMAL LOW (ref 3.87–5.11)
RDW: 14.9 % (ref 11.5–15.5)
RDW: 15.2 % (ref 11.5–15.5)
WBC: 10 10*3/uL (ref 4.0–10.5)
WBC: 8.6 10*3/uL (ref 4.0–10.5)

## 2015-12-31 LAB — TYPE AND SCREEN
ABO/RH(D): AB NEG
Antibody Screen: POSITIVE
DAT, IgG: NEGATIVE
Donor AG Type: NEGATIVE
Donor AG Type: NEGATIVE
UNIT DIVISION: 0
Unit division: 0

## 2015-12-31 MED ORDER — SODIUM CHLORIDE 0.9% FLUSH: 10.0000 mL | INTRAVENOUS | Status: DC | PRN

## 2015-12-31 NOTE — Evaluation (Signed)
Physical Therapy Evaluation Patient Details Name: Heather Jimenez MRN: [id number] DOB: [birth year] Today's Date: 12/31/2015   History of Present Illness  70 yo with LLE pain with excision of left femoral pseudoaneurysm and aortofemoral BPG. PMhx: aortobifem BPG, Rt AKA, PAD, throat CA, laryngectomy  Clinical Impression  Pt very pleasant and states high level of function from [facility] at home as well as limited ambulation when prosthesis present. Pt communicating with white board and mouthing words. Pt sponge baths, utilizes adult briefs and has all necessary equipment for function at home. Pt with decreased ability with bed mobility today due to pain and anticipate quick recovery of ability with transfers. Pt will benefit from acute therapy to maximize mobility and function to return to mod I. Encouraged pt to have spouse bring prosthesis if she does not D/C tomorrow as anticipated. Recommend OOB throughout the day with nursing supervision.     Follow Up Recommendations No PT follow up    Equipment Recommendations  None recommended by PT    Recommendations for Other Services       Precautions / Restrictions Precautions Precautions: Fall Precaution Comments: Rt AKA      Mobility  Bed Mobility Overal bed mobility: Needs Assistance Bed Mobility: Supine to Sit     Supine to sit: Min assist     General bed mobility comments: min assist to elevate trunk due to pt reporting painful left hip with transition  Transfers Overall transfer level: Needs assistance   Transfers: Squat Pivot Transfers     Squat pivot transfers: Supervision     General transfer comment: supervision only for setup with pt able to scoot to EOB and perform a squat pivot with coming almost to full standing without physical assist. Supervision for line  Ambulation/Gait             General Gait Details: unable as pt prosthesis is at home. Pt states she is to D/C tomorrow. Encouraged her to have spouse bring  leg if she does not D/C  Stairs            Wheelchair Mobility    Modified Rankin (Stroke Patients Only)       Balance Overall balance assessment: Modified Independent                                           Pertinent Vitals/Pain Pain Assessment: 0-10 Pain Score: 2  Pain Location: left hip incision Pain Descriptors / Indicators: Sore Pain Intervention(s): Limited activity within patient's tolerance    Home Living Family/patient expects to be discharged to:: Private residence Living Arrangements: Spouse/significant other Available Help at Discharge: Family;Available 24 hours/day Type of Home: Mobile home Home Access: Level entry     Home Layout: One level Home Equipment: Walker - 2 wheels;Bedside commode;Wheelchair - manual;Wheelchair - power;Crutches      Prior Function Level of Independence: Independent with assistive device(s)         Comments: pt uses prosthesis and RW for ambulation in home, tends to use [place] chair for cleaning and cooking. Sponge bathes mod I on toilet, dresses herself, transfers without assist     Hand Dominance        Extremity/Trunk Assessment   Upper Extremity Assessment: Overall WFL for tasks assessed           Lower Extremity Assessment: Overall WFL for tasks assessed (Rt AKA)  Cervical / Trunk Assessment: Normal  Communication   Communication: Other (comment) (laryngectomy, communicates writing on white board)  Cognition Arousal/Alertness: Awake/alert Behavior During Therapy: WFL for tasks assessed/performed Overall Cognitive Status: Within Functional Limits for tasks assessed                      General Comments      Exercises General Exercises - Lower Extremity Long Arc Quad: AROM;Left;15 reps;Seated   Assessment/Plan    PT Assessment Patient needs continued PT services  PT Problem List Decreased activity tolerance;Pain          PT Treatment Interventions  Functional mobility training;Therapeutic activities;Patient/family education    PT Goals (Current goals can be found in the Care Plan section)  Acute Rehab PT Goals Patient Stated Goal: return home PT Goal Formulation: With patient Time For Goal Achievement: 01/07/16 Potential to Achieve Goals: Good    Frequency Min 2X/week   Barriers to discharge        Co-evaluation               End of Session   Activity Tolerance: Patient tolerated treatment well Patient left: in chair;with call bell/phone within reach;with nursing/sitter in room Nurse Communication: Mobility status         Time: 1959-7471 PT Time Calculation (min) (ACUTE ONLY): 20 min   Charges:   PT Evaluation $PT Eval Moderate Complexity: 1 Procedure     PT G CodesMelford Aase 12/31/2015, 9:44 AM Elwyn Reach, [place]

## 2015-12-31 NOTE — Progress Notes (Signed)
  Progress Note    12/31/2015 9:03 AM 1 Day Post-Op  Subjective:  Feeling fine this a.m.  Vitals:   12/31/15 0430 12/31/15 0440  BP: (!) 118/55 128/67  Pulse: 86 89  Resp: 18 18  Temp: 98.7 F (37.1 C) 98.8 F (37.1 C)    Physical Exam: Abdomen is soft Left groin incision cdi with minimal hematoma Left foot is warm with palpable dp  CBC    Component Value Date/Time   WBC 10.0 12/31/2015 0820   RBC 3.76 (L) 12/31/2015 0820   HGB 11.1 (L) 12/31/2015 0820   HCT 32.6 (L) 12/31/2015 0820   PLT 230 12/31/2015 0820   MCV 86.7 12/31/2015 0820   MCH 29.5 12/31/2015 0820   MCHC 34.0 12/31/2015 0820   RDW 14.9 12/31/2015 0820   LYMPHSABS 1.1 12/29/2015 0151   MONOABS 1.0 12/29/2015 0151   EOSABS 0.1 12/29/2015 0151   BASOSABS 0.1 12/29/2015 0151    BMET    Component Value Date/Time   NA 134 (L) 12/30/2015 2359   K 4.2 12/30/2015 2359   CL 104 12/30/2015 2359   CO2 22 12/30/2015 2359   GLUCOSE 287 (H) 12/30/2015 2359   BUN 5 (L) 12/30/2015 2359   CREATININE 0.73 12/30/2015 2359   CALCIUM 8.1 (L) 12/30/2015 2359   GFRNONAA >60 12/30/2015 2359   GFRAA >60 12/30/2015 2359    INR    Component Value Date/Time   INR 0.96 12/29/2015 1450     Intake/Output Summary (Last 24 hours) at 12/31/15 0903 Last data filed at 12/31/15 0443  Gross per 24 hour  Intake             3590 ml  Output             2375 ml  Net             1215 ml     Assessment:  70 y.o. female is s/p repair of left femoral anastomotic psa  Plan: Diet as tolerated hliv oob  Dispo to home soon   [place] C. Donzetta Matters, MD [facility] [place] [facility] Office: [phone] Pager: [phone]  12/31/2015 9:03 AM

## 2015-12-31 NOTE — Evaluation (Signed)
Occupational Therapy Evaluation Patient Details Name: Heather Jimenez MRN: [id number] DOB: 1945/12/20 Today's Date: 12/31/2015    History of Present Illness 70 yo with LLE pain with excision of left femoral pseudoaneurysm and aortofemoral BPG. PMhx: aortobifem BPG, Rt AKA, PAD, throat CA, laryngectomy   Clinical Impression   Pt is at set up with sup level with UB  ADLs. PTA, spouse assisted with dressing. Pt required min A for bed mobility due to L LE incision site pain. Pt has prosthesis at home and all necessary DME and A/E. All education completed and no further acute OT indicated    Follow Up Recommendations  No OT follow up;Supervision/Assistance - 24 hour    Equipment Recommendations  None recommended by OT    Recommendations for Other Services       Precautions / Restrictions Precautions Precautions: Fall Precaution Comments: Rt AKA      Mobility Bed Mobility Overal bed mobility: Needs Assistance Bed Mobility: Supine to Sit;Sit to Supine     Supine to sit: Min assist Sit to supine: Min assist   General bed mobility comments: min assist to elevate trunk due to pt reporting painful left hip with transition  Transfers                 General transfer comment: pt declined due to L LE soreness and wanting to eat lunch just arriving. Per PT note, pt is sup with SPT    Balance Overall balance assessment: Modified Independent                                          ADL Overall ADL's : Needs assistance/impaired (sup - set up level. spouse previously assisted with dressing)                                             Vision  reading glasses, no change from baseline          Pertinent Vitals/Pain Pain Assessment: Faces Faces Pain Scale: Hurts a little bit Pain Location: L hip/LE Pain Descriptors / Indicators: Grimacing;Guarding;Sore Pain Intervention(s): Limited activity within patient's tolerance;Monitored  during session     Hand Dominance Left   Extremity/Trunk Assessment Upper Extremity Assessment Upper Extremity Assessment: Overall WFL for tasks assessed   Lower Extremity Assessment Lower Extremity Assessment: Defer to PT evaluation   Cervical / Trunk Assessment Cervical / Trunk Assessment: Normal   Communication Communication Communication: Other (comment) (voice box, white board)   Cognition Arousal/Alertness: Awake/alert Behavior During Therapy: WFL for tasks assessed/performed Overall Cognitive Status: Within Functional Limits for tasks assessed                     General Comments   pt very pleasant and cooperative, family very supportive                 Home Living Family/patient expects to be discharged to:: Private residence Living Arrangements: Spouse/significant other Available Help at Discharge: Family;Available 24 hours/day Type of Home: Mobile home Home Access: Level entry     Home Layout: One level     Bathroom Shower/Tub: Teacher, early years/pre: Standard     Home Equipment: Environmental consultant - 2 wheels;Bedside commode;Wheelchair - manual;Wheelchair - power;Crutches  Prior Functioning/Environment Level of Independence: Independent with assistive device(s)    ADL's / Homemaking Assistance Needed: sponge bathes, spouse assists with dressing. Cooks from w/c level Communication / Swallowing Assistance Needed: uses voice box Comments: pt uses prosthesis and RW for ambulation in home, tends to use [place] chair for cleaning and cooking. Sponge bathes mod I on toilet, dresses herself, transfers without assist        OT Problem List: Pain         OT Goals(Current goals can be found in the care plan section) Acute Rehab OT Goals Patient Stated Goal: return home OT Goal Formulation: With patient/family       Barriers to D/C:    no barriers       Co-evaluation              End of Session    Activity Tolerance:  Patient tolerated treatment well Patient left: in bed;with family/visitor present   Time: 5400-8676 OT Time Calculation (min): 18 min Charges:  OT General Charges $OT Visit: 1 Procedure OT Evaluation $OT Eval Moderate Complexity: 1 Procedure G-Codes:    Britt Bottom 12/31/2015, 2:08 PM

## 2016-01-01 ENCOUNTER — Telehealth: Payer: Self-pay | Admitting: Vascular Surgery

## 2016-01-01 MED ORDER — PNEUMOCOCCAL VAC POLYVALENT 25 MCG/0.5ML IJ INJ
0.5000 mL | INJECTION | Freq: Once | INTRAMUSCULAR | Status: AC
  Administered 2016-01-01: 0.5 mL via INTRAMUSCULAR
  Filled 2016-01-01: qty 0.5

## 2016-01-01 MED ORDER — OXYCODONE-ACETAMINOPHEN 5-325 MG PO TABS: 1.0000 | ORAL_TABLET | Freq: Four times a day (QID) | ORAL | 0 refills | Status: DC | PRN

## 2016-01-01 MED ORDER — INFLUENZA VAC SPLIT QUAD 0.5 ML IM SUSY
0.5000 mL | PREFILLED_SYRINGE | Freq: Once | INTRAMUSCULAR | Status: AC
  Administered 2016-01-01: 0.5 mL via INTRAMUSCULAR
  Filled 2016-01-01: qty 0.5

## 2016-01-01 NOTE — Progress Notes (Signed)
  Progress Note    01/01/2016 9:28 AM 2 Days Post-Op  Subjective:  Feeling good this a.m  Vitals:   12/31/15 2100 01/01/16 0524  BP: 124/61 133/77  Pulse: (!) 104 87  Resp: 18 18  Temp: 99.8 F (37.7 C) 98.7 F (37.1 C)    Physical Exam: Abdomen is soft L groin cdi Signal at left AT  CBC    Component Value Date/Time   WBC 10.0 12/31/2015 0820   RBC 3.76 (L) 12/31/2015 0820   HGB 11.1 (L) 12/31/2015 0820   HCT 32.6 (L) 12/31/2015 0820   PLT 230 12/31/2015 0820   MCV 86.7 12/31/2015 0820   MCH 29.5 12/31/2015 0820   MCHC 34.0 12/31/2015 0820   RDW 14.9 12/31/2015 0820   LYMPHSABS 1.1 12/29/2015 0151   MONOABS 1.0 12/29/2015 0151   EOSABS 0.1 12/29/2015 0151   BASOSABS 0.1 12/29/2015 0151    BMET    Component Value Date/Time   NA 138 12/31/2015 0820   K 3.7 12/31/2015 0820   CL 106 12/31/2015 0820   CO2 24 12/31/2015 0820   GLUCOSE 188 (H) 12/31/2015 0820   BUN <5 (L) 12/31/2015 0820   CREATININE 0.55 12/31/2015 0820   CALCIUM 8.2 (L) 12/31/2015 0820   GFRNONAA >60 12/31/2015 0820   GFRAA >60 12/31/2015 0820    INR    Component Value Date/Time   INR 0.96 12/29/2015 1450     Intake/Output Summary (Last 24 hours) at 01/01/16 0928 Last data filed at 01/01/16 0527  Gross per 24 hour  Intake              360 ml  Output              900 ml  Net             -540 ml     Assessment:  70 y.o. female is pod#2 repair of left femoral aobf anastomotic psa  Plan: D/c home today Will schedule f/u in 2-4 weeks   Dominiq Fontaine C. Donzetta Matters, MD [facility] [place] Office: [phone] Pager: [phone]  01/01/2016 9:28 AM

## 2016-01-01 NOTE — Progress Notes (Signed)
Patient education completed fro removal of central line they verbalised understanding, Central line dc as ordered, patient on bedrest, will continue to monitor

## 2016-01-01 NOTE — Telephone Encounter (Signed)
Spoke to pts spouse will mail letter as well for appt 11/3

## 2016-01-01 NOTE — Care Management Note (Signed)
Case Management Note [name] RN, BSN Unit 2W-Case Manager [phone]  Patient Details  Name: Heather Jimenez MRN: [id number] Date of Birth: Jul 16, 1945  Subjective/Objective:  Pt admitted with pseudo aneurysm- s/p repair left femoral aobf anastomotic psa                  Action/Plan: PTA pt lived at home with spouse- plan to return home- per PT eval- no f/u recommendations made-   Expected Discharge Date:    01/01/16              Expected Discharge Plan:  Home/Self Care  In-House Referral:     Discharge planning Services  CM Consult  Post Acute Care Choice:    Choice offered to:     DME Arranged:    DME Agency:     HH Arranged:    Justice Agency:     Status of Service:  Completed, signed off  If discussed at [place] of Stay Meetings, dates discussed:    Discharge Disposition: home/self care   Additional Comments:  Dawayne Patricia, RN 01/01/2016, 10:02 AM

## 2016-01-01 NOTE — Telephone Encounter (Signed)
-----   Message from Mena Goes, RN sent at 01/01/2016  9:22 AM EDT ----- Regarding: schedule 2 weeks   ----- Message ----- From: Alvia Grove, PA-C Sent: 01/01/2016   7:53 AM To: Vvs Charge Pool  S/p  1.  Excision of left femoral pseudoaneurysm 2.  Bypass from left limb aortofemoral graft to left common femoral artery with 54m dacron 12/30/15  F/u with Dr. CDonzetta Mattersin 2 weeks  Thanks KMaudie Mercury

## 2016-01-01 NOTE — Progress Notes (Signed)
Patient in a stable condition ,discharge education completed patient verbalised understanding, paper prescription given to patient  Iv removed , tele dc ccmd notified , patient belongings at bedside

## 2016-01-03 LAB — TYPE AND SCREEN
ABO/RH(D): AB NEG
ANTIBODY SCREEN: POSITIVE
DAT, IgG: NEGATIVE
DONOR AG TYPE: NEGATIVE
DONOR AG TYPE: NEGATIVE
DONOR AG TYPE: NEGATIVE
Donor AG Type: NEGATIVE
UNIT DIVISION: 0
Unit division: 0
Unit division: 0
Unit division: 0

## 2016-01-06 NOTE — Anesthesia Postprocedure Evaluation (Signed)
Anesthesia Post Note  Patient: Heather Jimenez  Procedure(s) Performed: Procedure(s) (LRB): Excision of Left FEMORAL PSEUDO-ANEURYSM (Left) BYPASS From Aorta-Femoral to Femoral Bypass Graft using Dacron Graft (Left)  Patient location during evaluation: PACU Anesthesia Type: General Level of consciousness: awake, awake and alert and oriented Pain management: pain level controlled Vital Signs Assessment: post-procedure vital signs reviewed and stable Respiratory status: spontaneous breathing, nonlabored ventilation and respiratory function stable Cardiovascular status: blood pressure returned to baseline Anesthetic complications: no    Last Vitals:  Vitals:   01/01/16 0524 01/01/16 1055  BP: 133/77 131/66  Pulse: 87 88  Resp: 18 18  Temp: 37.1 C 36.4 C    Last Pain:  Vitals:   01/01/16 1055  TempSrc: Axillary  PainSc:                  [place] Romey COKER

## 2016-01-07 NOTE — Discharge Summary (Signed)
[facility] Discharge Summary  Heather Jimenez October 23, 1945 70 y.o. female  [id number]  Admission Date: 12/29/2015  Discharge Date: 01/01/2016  Physician: Servando Snare, MD  Admission Diagnosis: [place] tx FEMORAL PSEUDO-ANEURYSM- LEFT  HPI:   This is a 70 y.o. female with history of AoBF in 2010 with RLE bypass. She has subsequent occlusion of the graft and R AKA. She now presents with 1 month history of left groin pain and swelling. The pain is worse at night and exacerbated with with certain positioning. Her husband noted that the mass possibly enlarged yesterday which prompted ED visit. She is now s/p CT scan. She is comfortable in minimal pain at time of interview. Interview limited by previous laryngectomy.  Hospital Course:  The patient was admitted to the hospital on 12/29/15. She had no signs of infection on exam or history. Plans were made for the OR the following day. She was taken to the operating room on 12/30/2015 and underwent:  1.  Excision of left femoral pseudoaneurysm 2.  Bypass from left limb aortofemoral graft to left common femoral artery with 51m dacron  The patient tolerated the procedure well and was transported to the PACU in stable condition.   The patient did well post operatively. Her left groin incision was clean and intact with minimal residual hematoma. She had an audible left AT signal. She was ambulating adequately by POD 2 and was discontinued home in good condition.     CBC    Component Value Date/Time   WBC 10.0 12/31/2015 0820   RBC 3.76 (L) 12/31/2015 0820   HGB 11.1 (L) 12/31/2015 0820   HCT 32.6 (L) 12/31/2015 0820   PLT 230 12/31/2015 0820   MCV 86.7 12/31/2015 0820   MCH 29.5 12/31/2015 0820   MCHC 34.0 12/31/2015 0820   RDW 14.9 12/31/2015 0820   LYMPHSABS 1.1 12/29/2015 0151   MONOABS 1.0 12/29/2015 0151   EOSABS 0.1 12/29/2015 0151   BASOSABS 0.1 12/29/2015 0151    BMET    Component Value Date/Time   NA 138 12/31/2015 0820   K 3.7 12/31/2015 0820   CL 106 12/31/2015 0820   CO2 24 12/31/2015 0820   GLUCOSE 188 (H) 12/31/2015 0820   BUN <5 (L) 12/31/2015 0820   CREATININE 0.55 12/31/2015 0820   CALCIUM 8.2 (L) 12/31/2015 0820   GFRNONAA >60 12/31/2015 0820   GFRAA >60 12/31/2015 0820     Discharge Instructions:   The patient is discharged to home with extensive instructions on wound care and progressive ambulation.  They are instructed not to drive or perform any heavy lifting until returning to see the physician in his office.  Discharge Instructions    Call MD for:  redness, tenderness, or signs of infection (pain, swelling, bleeding, redness, odor or green/yellow discharge around incision site)    Complete by:  As directed    Call MD for:  severe or increased pain, loss or decreased feeling  in affected limb(s)    Complete by:  As directed    Call MD for:  temperature >100.5    Complete by:  As directed    Discharge wound care:    Complete by:  As directed    Wash the groin wound with soap and water daily and pat dry. (No tub bath-only shower)  Then put a dry gauze or washcloth there to keep this area dry daily and as needed.  Do not use Vaseline or neosporin on your incisions.  Only  use soap and water on your incisions and then protect and keep dry.   Driving Restrictions    Complete by:  As directed    No driving for 2 weeks   Increase activity slowly    Complete by:  As directed    Walk with assistance use walker or cane as needed   Lifting restrictions    Complete by:  As directed    No lifting for 2 weeks   Resume previous diet    Complete by:  As directed       Discharge Diagnosis:  [place] tx FEMORAL PSEUDO-ANEURYSM- LEFT  Secondary Diagnosis: Patient Active Problem List   Diagnosis Date Noted  . Aneurysm artery, femoral ([place]) 12/29/2015  . PAD (peripheral artery disease) ([place]) 01/05/2011  . Hypercholesterolemia 01/05/2011  . CAD (coronary artery  disease) 01/05/2011  . Tobacco abuse 01/05/2011   Past Medical History:  Diagnosis Date  . Carotid bruit   . Dyslipidemia   . PAD (peripheral artery disease) ([place])   . Pneumonia   . Throat cancer ([place])   . Tobacco dependence        Medication List    TAKE these medications   aspirin EC 81 MG tablet Take 81 mg by mouth daily as needed (pain).   aspirin-sod bicarb-citric acid 325 MG Tbef tablet Commonly known as:  ALKA-SELTZER Take 325 mg by mouth every 6 (six) hours as needed (for indigestion).   gabapentin 400 MG capsule Commonly known as:  NEURONTIN Take 400 mg by mouth See admin instructions. Two to three times a day   KLOR-CON M20 20 MEQ tablet Generic drug:  potassium chloride SA Take 20 mEq by mouth 2 (two) times daily.   lisinopril 20 MG tablet Commonly known as:  PRINIVIL,ZESTRIL Take 20 mg by mouth once a day   meloxicam 7.5 MG tablet Commonly known as:  MOBIC Take 7.5 mg by mouth 2 (two) times daily as needed for pain.   oxyCODONE-acetaminophen 5-325 MG tablet Commonly known as:  PERCOCET/ROXICET Take 1 tablet by mouth every 6 (six) hours as needed for moderate pain.       Percocet #30 No Refill  Disposition: home  Patient's condition: is Good  Follow up: 1. Dr. Donzetta Matters in 2 weeks   Virgina Jock, PA-C [facility] [phone] 01/07/2016  12:35 PM

## 2016-01-14 DIAGNOSIS — Z89611 Acquired absence of right leg above knee: Secondary | ICD-10-CM | POA: Diagnosis not present

## 2016-01-14 DIAGNOSIS — G629 Polyneuropathy, unspecified: Secondary | ICD-10-CM | POA: Diagnosis not present

## 2016-01-14 DIAGNOSIS — C140 Malignant neoplasm of pharynx, unspecified: Secondary | ICD-10-CM | POA: Diagnosis not present

## 2016-01-14 DIAGNOSIS — I10 Essential (primary) hypertension: Secondary | ICD-10-CM | POA: Diagnosis not present

## 2016-01-14 DIAGNOSIS — E7800 Pure hypercholesterolemia, unspecified: Secondary | ICD-10-CM | POA: Diagnosis not present

## 2016-01-14 DIAGNOSIS — Z720 Tobacco use: Secondary | ICD-10-CM | POA: Diagnosis not present

## 2016-01-14 DIAGNOSIS — E876 Hypokalemia: Secondary | ICD-10-CM | POA: Diagnosis not present

## 2016-01-14 DIAGNOSIS — I739 Peripheral vascular disease, unspecified: Secondary | ICD-10-CM | POA: Diagnosis not present

## 2016-01-14 DIAGNOSIS — R600 Localized edema: Secondary | ICD-10-CM | POA: Diagnosis not present

## 2016-01-14 DIAGNOSIS — R636 Underweight: Secondary | ICD-10-CM | POA: Diagnosis not present

## 2016-01-14 DIAGNOSIS — J449 Chronic obstructive pulmonary disease, unspecified: Secondary | ICD-10-CM | POA: Diagnosis not present

## 2016-01-14 DIAGNOSIS — M159 Polyosteoarthritis, unspecified: Secondary | ICD-10-CM | POA: Diagnosis not present

## 2016-01-15 ENCOUNTER — Encounter: Payer: Self-pay | Admitting: Vascular Surgery

## 2016-01-17 ENCOUNTER — Ambulatory Visit (INDEPENDENT_AMBULATORY_CARE_PROVIDER_SITE_OTHER): Payer: Medicare Other | Admitting: Vascular Surgery

## 2016-01-17 ENCOUNTER — Encounter: Payer: Self-pay | Admitting: Vascular Surgery

## 2016-01-17 VITALS — BP 115/75 | HR 89 | Temp 98.5°F | Resp 20 | Ht 63.0 in | Wt 88.0 lb

## 2016-01-17 DIAGNOSIS — Z95828 Presence of other vascular implants and grafts: Secondary | ICD-10-CM

## 2016-01-17 DIAGNOSIS — Z48812 Encounter for surgical aftercare following surgery on the circulatory system: Secondary | ICD-10-CM | POA: Insufficient documentation

## 2016-01-17 NOTE — Progress Notes (Signed)
Subjective:     Patient ID: NAYRA COURY, female   DOB: Aug 18, 1945, 70 y.o.   MRN: [id number]  HPI 70 year old white female recently underwent left femoral anastomotic pseudoaneurysm repair with interposition bypass graft. She now presents for follow-up evaluation and is having no issues from this.    Review of Systems No complaints today in office    Objective:   Physical Exam  aaox3 Abdomen is soft Left groin incision is healing well  Left foot warm AT signal on left     Assessment/Plan     70 year old female here for follow-up of recent interposition graft left aortofemoral graft to common femoral artery with dacron for pseudoaneurysm repair. She is now doing quite well with signal at her left anterior tibial artery. I'll see her again in 6 months with duplex of her left lower extremity to evaluate. Should she have issues before that we will see her sooner.    Abegail Kloeppel C. Donzetta Matters, MD [facility] [place] Office: [phone] Pager: [phone]

## 2016-02-04 NOTE — Addendum Note (Signed)
Addended by: Lianne Cure A on: 02/04/2016 09:39 AM   Modules accepted: Orders

## 2016-02-14 DIAGNOSIS — I10 Essential (primary) hypertension: Secondary | ICD-10-CM | POA: Diagnosis not present

## 2016-02-14 DIAGNOSIS — R636 Underweight: Secondary | ICD-10-CM | POA: Diagnosis not present

## 2016-02-14 DIAGNOSIS — J449 Chronic obstructive pulmonary disease, unspecified: Secondary | ICD-10-CM | POA: Diagnosis not present

## 2016-02-14 DIAGNOSIS — Z720 Tobacco use: Secondary | ICD-10-CM | POA: Diagnosis not present

## 2016-02-14 DIAGNOSIS — Z89611 Acquired absence of right leg above knee: Secondary | ICD-10-CM | POA: Diagnosis not present

## 2016-02-14 DIAGNOSIS — R600 Localized edema: Secondary | ICD-10-CM | POA: Diagnosis not present

## 2016-02-14 DIAGNOSIS — G629 Polyneuropathy, unspecified: Secondary | ICD-10-CM | POA: Diagnosis not present

## 2016-02-14 DIAGNOSIS — M159 Polyosteoarthritis, unspecified: Secondary | ICD-10-CM | POA: Diagnosis not present

## 2016-02-14 DIAGNOSIS — C140 Malignant neoplasm of pharynx, unspecified: Secondary | ICD-10-CM | POA: Diagnosis not present

## 2016-02-14 DIAGNOSIS — E7800 Pure hypercholesterolemia, unspecified: Secondary | ICD-10-CM | POA: Diagnosis not present

## 2016-02-14 DIAGNOSIS — I739 Peripheral vascular disease, unspecified: Secondary | ICD-10-CM | POA: Diagnosis not present

## 2016-02-14 DIAGNOSIS — E876 Hypokalemia: Secondary | ICD-10-CM | POA: Diagnosis not present

## 2016-04-16 DIAGNOSIS — R636 Underweight: Secondary | ICD-10-CM | POA: Diagnosis not present

## 2016-04-16 DIAGNOSIS — I739 Peripheral vascular disease, unspecified: Secondary | ICD-10-CM | POA: Diagnosis not present

## 2016-04-16 DIAGNOSIS — Z720 Tobacco use: Secondary | ICD-10-CM | POA: Diagnosis not present

## 2016-04-16 DIAGNOSIS — E7800 Pure hypercholesterolemia, unspecified: Secondary | ICD-10-CM | POA: Diagnosis not present

## 2016-04-16 DIAGNOSIS — J449 Chronic obstructive pulmonary disease, unspecified: Secondary | ICD-10-CM | POA: Diagnosis not present

## 2016-04-16 DIAGNOSIS — C140 Malignant neoplasm of pharynx, unspecified: Secondary | ICD-10-CM | POA: Diagnosis not present

## 2016-04-16 DIAGNOSIS — R600 Localized edema: Secondary | ICD-10-CM | POA: Diagnosis not present

## 2016-04-16 DIAGNOSIS — I10 Essential (primary) hypertension: Secondary | ICD-10-CM | POA: Diagnosis not present

## 2016-04-16 DIAGNOSIS — G629 Polyneuropathy, unspecified: Secondary | ICD-10-CM | POA: Diagnosis not present

## 2016-04-16 DIAGNOSIS — M159 Polyosteoarthritis, unspecified: Secondary | ICD-10-CM | POA: Diagnosis not present

## 2016-04-16 DIAGNOSIS — E876 Hypokalemia: Secondary | ICD-10-CM | POA: Diagnosis not present

## 2016-04-16 DIAGNOSIS — Z89611 Acquired absence of right leg above knee: Secondary | ICD-10-CM | POA: Diagnosis not present

## 2016-04-23 DIAGNOSIS — Z681 Body mass index (BMI) 19 or less, adult: Secondary | ICD-10-CM | POA: Diagnosis not present

## 2016-04-23 DIAGNOSIS — G629 Polyneuropathy, unspecified: Secondary | ICD-10-CM | POA: Diagnosis not present

## 2016-04-23 DIAGNOSIS — I739 Peripheral vascular disease, unspecified: Secondary | ICD-10-CM | POA: Diagnosis not present

## 2016-04-23 DIAGNOSIS — R636 Underweight: Secondary | ICD-10-CM | POA: Diagnosis not present

## 2016-04-23 DIAGNOSIS — R600 Localized edema: Secondary | ICD-10-CM | POA: Diagnosis not present

## 2016-04-23 DIAGNOSIS — M159 Polyosteoarthritis, unspecified: Secondary | ICD-10-CM | POA: Diagnosis not present

## 2016-04-23 DIAGNOSIS — J449 Chronic obstructive pulmonary disease, unspecified: Secondary | ICD-10-CM | POA: Diagnosis not present

## 2016-04-23 DIAGNOSIS — C140 Malignant neoplasm of pharynx, unspecified: Secondary | ICD-10-CM | POA: Diagnosis not present

## 2016-04-23 DIAGNOSIS — Z89611 Acquired absence of right leg above knee: Secondary | ICD-10-CM | POA: Diagnosis not present

## 2016-04-23 DIAGNOSIS — E876 Hypokalemia: Secondary | ICD-10-CM | POA: Diagnosis not present

## 2016-04-23 DIAGNOSIS — E7800 Pure hypercholesterolemia, unspecified: Secondary | ICD-10-CM | POA: Diagnosis not present

## 2016-04-23 DIAGNOSIS — I10 Essential (primary) hypertension: Secondary | ICD-10-CM | POA: Diagnosis not present

## 2016-04-29 DIAGNOSIS — M159 Polyosteoarthritis, unspecified: Secondary | ICD-10-CM | POA: Diagnosis not present

## 2016-04-29 DIAGNOSIS — R636 Underweight: Secondary | ICD-10-CM | POA: Diagnosis not present

## 2016-04-29 DIAGNOSIS — I10 Essential (primary) hypertension: Secondary | ICD-10-CM | POA: Diagnosis not present

## 2016-04-29 DIAGNOSIS — E876 Hypokalemia: Secondary | ICD-10-CM | POA: Diagnosis not present

## 2016-04-29 DIAGNOSIS — R600 Localized edema: Secondary | ICD-10-CM | POA: Diagnosis not present

## 2016-04-29 DIAGNOSIS — I739 Peripheral vascular disease, unspecified: Secondary | ICD-10-CM | POA: Diagnosis not present

## 2016-04-29 DIAGNOSIS — G629 Polyneuropathy, unspecified: Secondary | ICD-10-CM | POA: Diagnosis not present

## 2016-04-29 DIAGNOSIS — C140 Malignant neoplasm of pharynx, unspecified: Secondary | ICD-10-CM | POA: Diagnosis not present

## 2016-04-29 DIAGNOSIS — J449 Chronic obstructive pulmonary disease, unspecified: Secondary | ICD-10-CM | POA: Diagnosis not present

## 2016-04-29 DIAGNOSIS — Z89611 Acquired absence of right leg above knee: Secondary | ICD-10-CM | POA: Diagnosis not present

## 2016-04-29 DIAGNOSIS — Z720 Tobacco use: Secondary | ICD-10-CM | POA: Diagnosis not present

## 2016-04-29 DIAGNOSIS — E7800 Pure hypercholesterolemia, unspecified: Secondary | ICD-10-CM | POA: Diagnosis not present

## 2016-05-07 ENCOUNTER — Ambulatory Visit: Payer: Medicare Other | Admitting: Family

## 2016-05-07 ENCOUNTER — Encounter (HOSPITAL_COMMUNITY): Payer: Medicare Other

## 2016-05-07 DIAGNOSIS — R636 Underweight: Secondary | ICD-10-CM | POA: Diagnosis not present

## 2016-05-07 DIAGNOSIS — M159 Polyosteoarthritis, unspecified: Secondary | ICD-10-CM | POA: Diagnosis not present

## 2016-05-07 DIAGNOSIS — R600 Localized edema: Secondary | ICD-10-CM | POA: Diagnosis not present

## 2016-05-07 DIAGNOSIS — J449 Chronic obstructive pulmonary disease, unspecified: Secondary | ICD-10-CM | POA: Diagnosis not present

## 2016-05-07 DIAGNOSIS — Z89611 Acquired absence of right leg above knee: Secondary | ICD-10-CM | POA: Diagnosis not present

## 2016-05-07 DIAGNOSIS — G629 Polyneuropathy, unspecified: Secondary | ICD-10-CM | POA: Diagnosis not present

## 2016-05-07 DIAGNOSIS — I739 Peripheral vascular disease, unspecified: Secondary | ICD-10-CM | POA: Diagnosis not present

## 2016-05-07 DIAGNOSIS — I10 Essential (primary) hypertension: Secondary | ICD-10-CM | POA: Diagnosis not present

## 2016-05-07 DIAGNOSIS — E876 Hypokalemia: Secondary | ICD-10-CM | POA: Diagnosis not present

## 2016-05-07 DIAGNOSIS — Z720 Tobacco use: Secondary | ICD-10-CM | POA: Diagnosis not present

## 2016-05-07 DIAGNOSIS — C140 Malignant neoplasm of pharynx, unspecified: Secondary | ICD-10-CM | POA: Diagnosis not present

## 2016-05-07 DIAGNOSIS — E7800 Pure hypercholesterolemia, unspecified: Secondary | ICD-10-CM | POA: Diagnosis not present

## 2016-06-01 DIAGNOSIS — E7800 Pure hypercholesterolemia, unspecified: Secondary | ICD-10-CM | POA: Diagnosis not present

## 2016-06-01 DIAGNOSIS — E119 Type 2 diabetes mellitus without complications: Secondary | ICD-10-CM | POA: Diagnosis not present

## 2016-06-01 DIAGNOSIS — Z8589 Personal history of malignant neoplasm of other organs and systems: Secondary | ICD-10-CM | POA: Diagnosis not present

## 2016-06-01 DIAGNOSIS — Z79899 Other long term (current) drug therapy: Secondary | ICD-10-CM | POA: Diagnosis not present

## 2016-06-01 DIAGNOSIS — F17210 Nicotine dependence, cigarettes, uncomplicated: Secondary | ICD-10-CM | POA: Diagnosis not present

## 2016-06-01 DIAGNOSIS — Z7982 Long term (current) use of aspirin: Secondary | ICD-10-CM | POA: Diagnosis not present

## 2016-06-01 DIAGNOSIS — H6123 Impacted cerumen, bilateral: Secondary | ICD-10-CM | POA: Diagnosis not present

## 2016-06-01 DIAGNOSIS — J449 Chronic obstructive pulmonary disease, unspecified: Secondary | ICD-10-CM | POA: Diagnosis not present

## 2016-06-01 DIAGNOSIS — I10 Essential (primary) hypertension: Secondary | ICD-10-CM | POA: Diagnosis not present

## 2016-06-01 DIAGNOSIS — Z85118 Personal history of other malignant neoplasm of bronchus and lung: Secondary | ICD-10-CM | POA: Diagnosis not present

## 2016-06-04 DIAGNOSIS — Z720 Tobacco use: Secondary | ICD-10-CM | POA: Diagnosis not present

## 2016-06-04 DIAGNOSIS — E46 Unspecified protein-calorie malnutrition: Secondary | ICD-10-CM | POA: Diagnosis not present

## 2016-06-04 DIAGNOSIS — G629 Polyneuropathy, unspecified: Secondary | ICD-10-CM | POA: Diagnosis not present

## 2016-06-04 DIAGNOSIS — J449 Chronic obstructive pulmonary disease, unspecified: Secondary | ICD-10-CM | POA: Diagnosis not present

## 2016-06-04 DIAGNOSIS — E876 Hypokalemia: Secondary | ICD-10-CM | POA: Diagnosis not present

## 2016-06-04 DIAGNOSIS — R600 Localized edema: Secondary | ICD-10-CM | POA: Diagnosis not present

## 2016-06-04 DIAGNOSIS — Z89611 Acquired absence of right leg above knee: Secondary | ICD-10-CM | POA: Diagnosis not present

## 2016-06-04 DIAGNOSIS — M159 Polyosteoarthritis, unspecified: Secondary | ICD-10-CM | POA: Diagnosis not present

## 2016-06-04 DIAGNOSIS — I10 Essential (primary) hypertension: Secondary | ICD-10-CM | POA: Diagnosis not present

## 2016-06-04 DIAGNOSIS — I739 Peripheral vascular disease, unspecified: Secondary | ICD-10-CM | POA: Diagnosis not present

## 2016-06-04 DIAGNOSIS — C140 Malignant neoplasm of pharynx, unspecified: Secondary | ICD-10-CM | POA: Diagnosis not present

## 2016-06-04 DIAGNOSIS — E7800 Pure hypercholesterolemia, unspecified: Secondary | ICD-10-CM | POA: Diagnosis not present

## 2016-07-06 DIAGNOSIS — M159 Polyosteoarthritis, unspecified: Secondary | ICD-10-CM | POA: Diagnosis not present

## 2016-07-06 DIAGNOSIS — C140 Malignant neoplasm of pharynx, unspecified: Secondary | ICD-10-CM | POA: Diagnosis not present

## 2016-07-06 DIAGNOSIS — E876 Hypokalemia: Secondary | ICD-10-CM | POA: Diagnosis not present

## 2016-07-06 DIAGNOSIS — J449 Chronic obstructive pulmonary disease, unspecified: Secondary | ICD-10-CM | POA: Diagnosis not present

## 2016-07-06 DIAGNOSIS — E7800 Pure hypercholesterolemia, unspecified: Secondary | ICD-10-CM | POA: Diagnosis not present

## 2016-07-06 DIAGNOSIS — R600 Localized edema: Secondary | ICD-10-CM | POA: Diagnosis not present

## 2016-07-06 DIAGNOSIS — I739 Peripheral vascular disease, unspecified: Secondary | ICD-10-CM | POA: Diagnosis not present

## 2016-07-06 DIAGNOSIS — I10 Essential (primary) hypertension: Secondary | ICD-10-CM | POA: Diagnosis not present

## 2016-07-06 DIAGNOSIS — Z720 Tobacco use: Secondary | ICD-10-CM | POA: Diagnosis not present

## 2016-07-06 DIAGNOSIS — G629 Polyneuropathy, unspecified: Secondary | ICD-10-CM | POA: Diagnosis not present

## 2016-07-06 DIAGNOSIS — E46 Unspecified protein-calorie malnutrition: Secondary | ICD-10-CM | POA: Diagnosis not present

## 2016-07-06 DIAGNOSIS — Z89611 Acquired absence of right leg above knee: Secondary | ICD-10-CM | POA: Diagnosis not present

## 2016-07-17 ENCOUNTER — Encounter (HOSPITAL_COMMUNITY): Payer: Medicare Other

## 2016-07-17 ENCOUNTER — Ambulatory Visit: Payer: Medicare Other | Admitting: Family

## 2016-07-31 DIAGNOSIS — E7800 Pure hypercholesterolemia, unspecified: Secondary | ICD-10-CM | POA: Diagnosis not present

## 2016-07-31 DIAGNOSIS — C140 Malignant neoplasm of pharynx, unspecified: Secondary | ICD-10-CM | POA: Diagnosis not present

## 2016-07-31 DIAGNOSIS — M159 Polyosteoarthritis, unspecified: Secondary | ICD-10-CM | POA: Diagnosis not present

## 2016-07-31 DIAGNOSIS — J449 Chronic obstructive pulmonary disease, unspecified: Secondary | ICD-10-CM | POA: Diagnosis not present

## 2016-07-31 DIAGNOSIS — I10 Essential (primary) hypertension: Secondary | ICD-10-CM | POA: Diagnosis not present

## 2016-07-31 DIAGNOSIS — R600 Localized edema: Secondary | ICD-10-CM | POA: Diagnosis not present

## 2016-07-31 DIAGNOSIS — E46 Unspecified protein-calorie malnutrition: Secondary | ICD-10-CM | POA: Diagnosis not present

## 2016-07-31 DIAGNOSIS — E876 Hypokalemia: Secondary | ICD-10-CM | POA: Diagnosis not present

## 2016-07-31 DIAGNOSIS — Z720 Tobacco use: Secondary | ICD-10-CM | POA: Diagnosis not present

## 2016-07-31 DIAGNOSIS — Z89611 Acquired absence of right leg above knee: Secondary | ICD-10-CM | POA: Diagnosis not present

## 2016-07-31 DIAGNOSIS — G629 Polyneuropathy, unspecified: Secondary | ICD-10-CM | POA: Diagnosis not present

## 2016-07-31 DIAGNOSIS — I739 Peripheral vascular disease, unspecified: Secondary | ICD-10-CM | POA: Diagnosis not present

## 2016-08-04 DIAGNOSIS — Z9889 Other specified postprocedural states: Secondary | ICD-10-CM | POA: Diagnosis not present

## 2016-08-04 DIAGNOSIS — Z9089 Acquired absence of other organs: Secondary | ICD-10-CM | POA: Diagnosis not present

## 2016-08-04 DIAGNOSIS — Z8521 Personal history of malignant neoplasm of larynx: Secondary | ICD-10-CM | POA: Diagnosis not present

## 2016-08-04 DIAGNOSIS — Z85818 Personal history of malignant neoplasm of other sites of lip, oral cavity, and pharynx: Secondary | ICD-10-CM | POA: Diagnosis not present

## 2016-08-04 DIAGNOSIS — Z08 Encounter for follow-up examination after completed treatment for malignant neoplasm: Secondary | ICD-10-CM | POA: Diagnosis not present

## 2016-08-17 ENCOUNTER — Encounter: Payer: Self-pay | Admitting: Family

## 2016-08-19 ENCOUNTER — Ambulatory Visit (INDEPENDENT_AMBULATORY_CARE_PROVIDER_SITE_OTHER)
Admission: RE | Admit: 2016-08-19 | Discharge: 2016-08-19 | Disposition: A | Discharge status: Home / Self Care | Payer: Medicare Other | Source: Ambulatory Visit | Attending: Vascular Surgery | Admitting: Vascular Surgery

## 2016-08-19 ENCOUNTER — Ambulatory Visit (HOSPITAL_COMMUNITY)
Admission: RE | Admit: 2016-08-19 | Discharge: 2016-08-19 | Disposition: A | Discharge status: Home / Self Care | Payer: Medicare Other | Source: Ambulatory Visit | Attending: Family | Admitting: Family

## 2016-08-19 ENCOUNTER — Ambulatory Visit (INDEPENDENT_AMBULATORY_CARE_PROVIDER_SITE_OTHER): Payer: Medicare Other | Admitting: Family

## 2016-08-19 VITALS — BP 158/96 | HR 88 | Temp 98.0°F | Resp 18 | Ht 63.0 in | Wt 77.0 lb

## 2016-08-19 DIAGNOSIS — F17200 Nicotine dependence, unspecified, uncomplicated: Secondary | ICD-10-CM

## 2016-08-19 DIAGNOSIS — I779 Disorder of arteries and arterioles, unspecified: Secondary | ICD-10-CM | POA: Insufficient documentation

## 2016-08-19 DIAGNOSIS — Z89611 Acquired absence of right leg above knee: Secondary | ICD-10-CM | POA: Insufficient documentation

## 2016-08-19 DIAGNOSIS — Z95828 Presence of other vascular implants and grafts: Secondary | ICD-10-CM | POA: Diagnosis not present

## 2016-08-19 NOTE — Patient Instructions (Addendum)
Peripheral Vascular Disease Peripheral vascular disease (PVD) is a disease of the blood vessels that are not part of your heart and brain. A simple term for PVD is poor circulation. In most cases, PVD narrows the blood vessels that carry blood from your heart to the rest of your body. This can result in a decreased supply of blood to your arms, legs, and internal organs, like your stomach or kidneys. However, it most often affects a person's lower legs and feet. There are two types of PVD.  Organic PVD. This is the more common type. It is caused by damage to the structure of blood vessels.  Functional PVD. This is caused by conditions that make blood vessels contract and tighten (spasm).  Without treatment, PVD tends to get worse over time. PVD can also lead to acute ischemic limb. This is when an arm or limb suddenly has trouble getting enough blood. This is a medical emergency. Follow these instructions at home:  Take medicines only as told by your doctor.  Do not use any tobacco products, including cigarettes, chewing tobacco, or electronic cigarettes. If you need help quitting, ask your doctor.  Lose weight if you are overweight, and maintain a healthy weight as told by your doctor.  Eat a diet that is low in fat and cholesterol. If you need help, ask your doctor.  Exercise regularly. Ask your doctor for some good activities for you.  Take good care of your feet. ? Wear comfortable shoes that fit well. ? Check your feet often for any cuts or sores. Contact a doctor if:  You have cramps in your legs while walking.  You have leg pain when you are at rest.  You have coldness in a leg or foot.  Your skin changes.  You are unable to get or have an erection (erectile dysfunction).  You have cuts or sores on your feet that are not healing. Get help right away if:  Your arm or leg turns cold and blue.  Your arms or legs become red, warm, swollen, painful, or numb.  You have  chest pain or trouble breathing.  You suddenly have weakness in your face, arm, or leg.  You become very confused or you cannot speak.  You suddenly have a very bad headache.  You suddenly cannot see. This information is not intended to replace advice given to you by your health care provider. Make sure you discuss any questions you have with your health care provider. Document Released: 05/27/2009 Document Revised: 08/08/2015 Document Reviewed: 08/10/2013 Elsevier Interactive Patient Education  2017 Elsevier Inc.      Steps to Quit Smoking Smoking tobacco can be bad for your health. It can also affect almost every organ in your body. Smoking puts you and people around you at risk for many serious long-lasting (chronic) diseases. Quitting smoking is hard, but it is one of the best things that you can do for your health. It is never too late to quit. What are the benefits of quitting smoking? When you quit smoking, you lower your risk for getting serious diseases and conditions. They can include:  Lung cancer or lung disease.  Heart disease.  Stroke.  Heart attack.  Not being able to have children (infertility).  Weak bones (osteoporosis) and broken bones (fractures).  If you have coughing, wheezing, and shortness of breath, those symptoms may get better when you quit. You may also get sick less often. If you are pregnant, quitting smoking can help to lower   your chances of having a baby of low birth weight. What can I do to help me quit smoking? Talk with your doctor about what can help you quit smoking. Some things you can do (strategies) include:  Quitting smoking totally, instead of slowly cutting back how much you smoke over a period of time.  Going to in-person counseling. You are more likely to quit if you go to many counseling sessions.  Using resources and support systems, such as: ? Online chats with a counselor. ? Phone quitlines. ? Printed self-help  materials. ? Support groups or group counseling. ? Text messaging programs. ? Mobile phone apps or applications.  Taking medicines. Some of these medicines may have nicotine in them. If you are pregnant or breastfeeding, do not take any medicines to quit smoking unless your doctor says it is okay. Talk with your doctor about counseling or other things that can help you.  Talk with your doctor about using more than one strategy at the same time, such as taking medicines while you are also going to in-person counseling. This can help make quitting easier. What things can I do to make it easier to quit? Quitting smoking might feel very hard at first, but there is a lot that you can do to make it easier. Take these steps:  Talk to your family and friends. Ask them to support and encourage you.  Call phone quitlines, reach out to support groups, or work with a counselor.  Ask people who smoke to not smoke around you.  Avoid places that make you want (trigger) to smoke, such as: ? Bars. ? Parties. ? Smoke-break areas at work.  Spend time with people who do not smoke.  Lower the stress in your life. Stress can make you want to smoke. Try these things to help your stress: ? Getting regular exercise. ? Deep-breathing exercises. ? Yoga. ? Meditating. ? Doing a body scan. To do this, close your eyes, focus on one area of your body at a time from head to toe, and notice which parts of your body are tense. Try to relax the muscles in those areas.  Download or buy apps on your mobile phone or tablet that can help you stick to your quit plan. There are many free apps, such as QuitGuide from the CDC (Centers for Disease Control and Prevention). You can find more support from smokefree.gov and other websites.  This information is not intended to replace advice given to you by your health care provider. Make sure you discuss any questions you have with your health care provider. Document Released:  12/27/2008 Document Revised: 10/29/2015 Document Reviewed: 07/17/2014 Elsevier Interactive Patient Education  2018 Elsevier Inc.  

## 2016-08-19 NOTE — Progress Notes (Signed)
[facility] [place]   CC: Follow up peripheral artery occlusive disease  History of Present Illness Heather Jimenez is a 71 y.o. female who is s/p Excision of left femoral pseudoaneurysm and Bypass from left limb aortofemoral graft to left common femoral artery with 79mm Dacron on 12-30-15 by Dr. Donzetta Matters for pseudoaneurysm left aorto-femoral bypass.  Findings: There is a large pseudoaneurysm extending underneath the inguinal ligament down to the level of the SFA and profunda femoris arteries. The medial aspect of the anastomosis had dislodged leading to this large pseudoaneurysm. We did have a venous injury at the level of the external iliac which resolved with direct pressure. Completion of the procedure patient had her peroneal signal at the level of the foot there was a bypass from the aortofemoral to the common femoral artery with 8 mm dacron. Dr. Donzetta Matters evaluated pt on 01-17-16. At that time there was a Doppler signal at her left anterior tibial artery. He advised pt to return in 6 months with duplex of her left lower extremity.  She is also s/p Right AKA on 12-18-14.   She is known to Dr. Oneida Alar from having undergone prior aortobifemoral bypass and right femoral popliteal bypass in 2010.  At her 12/13/14 visit she had not been seen in the office since 2011. She is no longer able to speak following laryngectomy for throat cancer. She uses a Archivist to commumicate and her history is aided by her husband.  Unfortunately she still smokes, but has decreased to less than 1/3 ppd. She denies any non healing wounds or problems with her left leg. She was fitted for a right AKA prosthesis from [place] and has that with her today.  She is able to bake, cook, take care of herself, stand on her left leg.  Pt Diabetic: No Pt smoker: smoker (1/3 ppd, started at age 44 yrs)  Pt meds include: Statin :No Betablocker: No ASA: Yes Other anticoagulants/antiplatelets:  no   Past Medical History:  Diagnosis Date  . Carotid bruit   . Dyslipidemia   . PAD (peripheral artery disease) ([place])   . Pneumonia   . Throat cancer ([place])   . Tobacco dependence     Social History Social History  Substance Use Topics  . Smoking status: Light Tobacco Smoker    Packs/day: 1.00    Years: 35.00  . Smokeless tobacco: Never Used  . Alcohol use No    Family History Family History  Problem Relation Age of Onset  . Cancer Mother   . Lung cancer Brother     Past Surgical History:  Procedure Laterality Date  . AMPUTATION Right 12/18/2014   Procedure: AMPUTATION ABOVE KNEE;  Surgeon: Elam Dutch, MD;  Location: [place] [facility];  Service: Vascular;  Laterality: Right;  . AORTA - BILATERAL FEMORAL ARTERY BYPASS GRAFT    . COLONOSCOPY    . FALSE ANEURYSM REPAIR Left 12/30/2015   Procedure: Excision of Left FEMORAL PSEUDO-ANEURYSM;  Surgeon: Waynetta Sandy, MD;  Location: [place];  Service: Vascular;  Laterality: Left;  . FEMORAL-FEMORAL BYPASS GRAFT Left 12/30/2015   Procedure: BYPASS From Aorta-Femoral to Femoral Bypass Graft using Dacron Graft;  Surgeon: Waynetta Sandy, MD;  Location: [place];  Service: Vascular;  Laterality: Left;  . LOWER EXTREMITY ANGIOGRAM Bilateral 12/14/2014   Procedure: Lower Extremity Angiogram;  Surgeon: Elam Dutch, MD;  Location: [place] [facility];  Service: Cardiovascular;  Laterality: Bilateral;  . PEG TUBE PLACEMENT    .  PEG TUBE REMOVAL    . PERIPHERAL VASCULAR CATHETERIZATION N/A 12/14/2014   Procedure: Abdominal Aortogram;  Surgeon: Elam Dutch, MD;  Location: [place] [facility];  Service: Cardiovascular;  Laterality: N/A;  . TONSILLECTOMY    . TOTAL LARYNGECTOMY      No Known Allergies  Current Outpatient Prescriptions  Medication Sig Dispense Refill  . aspirin EC 81 MG tablet Take 81 mg by mouth daily as needed (pain).    Marland Kitchen aspirin-sod bicarb-citric acid (ALKA-SELTZER) 325 MG TBEF tablet Take 325 mg by  mouth every 6 (six) hours as needed (for indigestion).    . gabapentin (NEURONTIN) 400 MG capsule Take 400 mg by mouth See admin instructions. Two to three times a day  12  . KLOR-CON M20 20 MEQ tablet Take 20 mEq by mouth 2 (two) times daily.     Marland Kitchen lisinopril (PRINIVIL,ZESTRIL) 10 MG tablet TK 1 T PO D  12  . meloxicam (MOBIC) 7.5 MG tablet Take 7.5 mg by mouth 2 (two) times daily as needed for pain.    Marland Kitchen oxyCODONE-acetaminophen (PERCOCET/ROXICET) 5-325 MG tablet Take 1 tablet by mouth every 6 (six) hours as needed for moderate pain. (Patient not taking: Reported on 01/17/2016) 30 tablet 0   No current facility-administered medications for this visit.     ROS: See HPI for pertinent positives and negatives.   Physical Examination  Vitals:   08/19/16 1247  BP: (!) 158/96  Pulse: 88  Resp: 18  Temp: 98 F (36.7 C)  TempSrc: Oral  SpO2: 100%  Weight: 77 lb (34.9 kg)  Height: 5\' 3"  (1.6 m)   Body mass index is 13.64 kg/m.  General: A&O x 3, WDWN, thin female. Gait: seated in her wheelchair Eyes: PERRLA. Pulmonary: respirations are non labored, BBS are distant, no rales, rhonchi or wheezes, tracheotomy covered by a protective cloth kept in place with ties around her neck. Cardiac: regular rhythm, no detected murmur.     Carotid Bruits Right Left   Negative Negative  Aorta is not palpable. Radial pulses: radial pulses not palpable; left ulnar pulse is 1+ palpable, right brachial pulse is 1+ palpable. Both hands are pink and all fingers with brisk capillary refill.   VASCULAR EXAM: Extremities without ischemic changes, without Gangrene; without open wounds. Right AKA stump is well healed, no ulcers.      LE Pulses Right Left   FEMORAL not palpable 2+ palpable    POPLITEAL AKA   not palpable   POSTERIOR TIBIAL AKA  not palpable    DORSALIS PEDIS  ANTERIOR TIBIAL AKA  not palpable    Abdomen: soft, NT, no palpable masses. Skin: no rashes, see Extremities. Musculoskeletal: no muscle wasting or atrophy. Neurologic: A&O X 3; Appropriate Affect ; SENSATION: normal; MOTOR FUNCTION: moving all extremities equally, motor strength 5/5 throughout. Speech is made through an amplification device she wears at her waist on her pants, CN 2-12 intact.     Non-Invasive Vascular Imaging: DATE: 08/19/2016  Left LE Arterial Duplex: Left anastomosis measures 1.5 x 1.6 cm Known occlusion of the left SFA with reconstitution visualized above the popliteal by way of collaterals. Flow also appears by way of the profunda. Patent left limb of the aortobifemoral bypass graft. No pseudoaneurysm visualized.   ABI (Date: 08/19/2016)  R: AKA   L:   ABI: 0.76 (was 0.82 on 08-01-15),   PT: bi  DP: bi  TBI: 0.54 (was 0.73) Decline in left ABI and TBI: moderate arterial occlusive disease.  ASSESSMENT: MYRIKAL Jimenez is a 71 y.o. female who is s/p right AKA on 12-18-14.  She is also s/p Excision of left femoral pseudoaneurysm and Bypass from left limb aortofemoral graft to left common femoral artery with 19mm Dacron on 12-30-15 by Dr. Donzetta Matters for pseudoaneurysm left aorto-femoral bypass. Her right AKA stump is well healed, she has a new prosthesis from [place] and is using, She walks a great deal with her walker and prosthesis, has no claudication in her left leg. She has no signs of ischemia in her left foot/leg, her stump has no signs of pressure ulcers, no wounds.  Left ABI indicates moderate arterial occlusive disease, biphasic waveforms; decline in both ABI and TBI compared to a year ago.   Unfortunately she continues to smoke despite having undergone laryngectomy for throat cancer. Smoking is her primary atherosclerotic risk  factor.   PLAN:  The patient was counseled re smoking cessation and given several free resources re smoking cessation.  Continue graduated walking program. Based on the patient's vascular studies and examination, pt will return to clinic in 6 months with left ABI. I advised pt and her husband to notify us if she develops concerns re the circulation in her left foot or leg.   I discussed in depth with the patient the nature of atherosclerosis, and emphasized the importance of maximal medical management including strict control of blood pressure, blood glucose, and lipid levels, obtaining regular exercise, and cessation of smoking.  The patient is aware that without maximal medical management the underlying atherosclerotic disease process will progress, limiting the benefit of any interventions.  The patient was given information about PAD including signs, symptoms, treatment, what symptoms should prompt the patient to seek immediate medical care, and risk reduction measures to take.  Clemon Chambers, RN, MSN, FNP-C [facility] Phone: [phone]  Clinic MD: [facility]  08/19/16 1:04 PM

## 2016-08-20 ENCOUNTER — Encounter: Payer: Self-pay | Admitting: Family

## 2016-08-28 DIAGNOSIS — I739 Peripheral vascular disease, unspecified: Secondary | ICD-10-CM | POA: Diagnosis not present

## 2016-08-28 DIAGNOSIS — I10 Essential (primary) hypertension: Secondary | ICD-10-CM | POA: Diagnosis not present

## 2016-08-28 DIAGNOSIS — G629 Polyneuropathy, unspecified: Secondary | ICD-10-CM | POA: Diagnosis not present

## 2016-08-28 DIAGNOSIS — E46 Unspecified protein-calorie malnutrition: Secondary | ICD-10-CM | POA: Diagnosis not present

## 2016-08-28 DIAGNOSIS — R600 Localized edema: Secondary | ICD-10-CM | POA: Diagnosis not present

## 2016-08-28 DIAGNOSIS — Z89611 Acquired absence of right leg above knee: Secondary | ICD-10-CM | POA: Diagnosis not present

## 2016-08-28 DIAGNOSIS — J449 Chronic obstructive pulmonary disease, unspecified: Secondary | ICD-10-CM | POA: Diagnosis not present

## 2016-08-28 DIAGNOSIS — M159 Polyosteoarthritis, unspecified: Secondary | ICD-10-CM | POA: Diagnosis not present

## 2016-08-28 DIAGNOSIS — Z720 Tobacco use: Secondary | ICD-10-CM | POA: Diagnosis not present

## 2016-08-28 DIAGNOSIS — E7800 Pure hypercholesterolemia, unspecified: Secondary | ICD-10-CM | POA: Diagnosis not present

## 2016-08-28 DIAGNOSIS — E876 Hypokalemia: Secondary | ICD-10-CM | POA: Diagnosis not present

## 2016-08-28 DIAGNOSIS — C140 Malignant neoplasm of pharynx, unspecified: Secondary | ICD-10-CM | POA: Diagnosis not present

## 2016-09-03 NOTE — Addendum Note (Signed)
Addended by: Lianne Cure A on: 09/03/2016 03:30 PM   Modules accepted: Orders

## 2016-09-25 DIAGNOSIS — E7800 Pure hypercholesterolemia, unspecified: Secondary | ICD-10-CM | POA: Diagnosis not present

## 2016-09-25 DIAGNOSIS — E46 Unspecified protein-calorie malnutrition: Secondary | ICD-10-CM | POA: Diagnosis not present

## 2016-09-25 DIAGNOSIS — Z720 Tobacco use: Secondary | ICD-10-CM | POA: Diagnosis not present

## 2016-09-25 DIAGNOSIS — G629 Polyneuropathy, unspecified: Secondary | ICD-10-CM | POA: Diagnosis not present

## 2016-09-25 DIAGNOSIS — R600 Localized edema: Secondary | ICD-10-CM | POA: Diagnosis not present

## 2016-09-25 DIAGNOSIS — C140 Malignant neoplasm of pharynx, unspecified: Secondary | ICD-10-CM | POA: Diagnosis not present

## 2016-09-25 DIAGNOSIS — Z89611 Acquired absence of right leg above knee: Secondary | ICD-10-CM | POA: Diagnosis not present

## 2016-09-25 DIAGNOSIS — J449 Chronic obstructive pulmonary disease, unspecified: Secondary | ICD-10-CM | POA: Diagnosis not present

## 2016-09-25 DIAGNOSIS — I10 Essential (primary) hypertension: Secondary | ICD-10-CM | POA: Diagnosis not present

## 2016-09-25 DIAGNOSIS — E876 Hypokalemia: Secondary | ICD-10-CM | POA: Diagnosis not present

## 2016-09-25 DIAGNOSIS — I739 Peripheral vascular disease, unspecified: Secondary | ICD-10-CM | POA: Diagnosis not present

## 2016-09-25 DIAGNOSIS — M159 Polyosteoarthritis, unspecified: Secondary | ICD-10-CM | POA: Diagnosis not present

## 2016-10-23 DIAGNOSIS — E46 Unspecified protein-calorie malnutrition: Secondary | ICD-10-CM | POA: Diagnosis not present

## 2016-10-23 DIAGNOSIS — E876 Hypokalemia: Secondary | ICD-10-CM | POA: Diagnosis not present

## 2016-10-23 DIAGNOSIS — I10 Essential (primary) hypertension: Secondary | ICD-10-CM | POA: Diagnosis not present

## 2016-10-23 DIAGNOSIS — R600 Localized edema: Secondary | ICD-10-CM | POA: Diagnosis not present

## 2016-10-23 DIAGNOSIS — Z89611 Acquired absence of right leg above knee: Secondary | ICD-10-CM | POA: Diagnosis not present

## 2016-10-23 DIAGNOSIS — G629 Polyneuropathy, unspecified: Secondary | ICD-10-CM | POA: Diagnosis not present

## 2016-10-23 DIAGNOSIS — M159 Polyosteoarthritis, unspecified: Secondary | ICD-10-CM | POA: Diagnosis not present

## 2016-10-23 DIAGNOSIS — C140 Malignant neoplasm of pharynx, unspecified: Secondary | ICD-10-CM | POA: Diagnosis not present

## 2016-10-23 DIAGNOSIS — E7800 Pure hypercholesterolemia, unspecified: Secondary | ICD-10-CM | POA: Diagnosis not present

## 2016-10-23 DIAGNOSIS — Z720 Tobacco use: Secondary | ICD-10-CM | POA: Diagnosis not present

## 2016-10-23 DIAGNOSIS — J449 Chronic obstructive pulmonary disease, unspecified: Secondary | ICD-10-CM | POA: Diagnosis not present

## 2016-10-23 DIAGNOSIS — I739 Peripheral vascular disease, unspecified: Secondary | ICD-10-CM | POA: Diagnosis not present

## 2016-12-24 DIAGNOSIS — I739 Peripheral vascular disease, unspecified: Secondary | ICD-10-CM | POA: Diagnosis not present

## 2016-12-24 DIAGNOSIS — E876 Hypokalemia: Secondary | ICD-10-CM | POA: Diagnosis not present

## 2016-12-24 DIAGNOSIS — Z1389 Encounter for screening for other disorder: Secondary | ICD-10-CM | POA: Diagnosis not present

## 2016-12-24 DIAGNOSIS — E7800 Pure hypercholesterolemia, unspecified: Secondary | ICD-10-CM | POA: Diagnosis not present

## 2016-12-24 DIAGNOSIS — R600 Localized edema: Secondary | ICD-10-CM | POA: Diagnosis not present

## 2016-12-24 DIAGNOSIS — M159 Polyosteoarthritis, unspecified: Secondary | ICD-10-CM | POA: Diagnosis not present

## 2016-12-24 DIAGNOSIS — I10 Essential (primary) hypertension: Secondary | ICD-10-CM | POA: Diagnosis not present

## 2016-12-24 DIAGNOSIS — C140 Malignant neoplasm of pharynx, unspecified: Secondary | ICD-10-CM | POA: Diagnosis not present

## 2016-12-24 DIAGNOSIS — J449 Chronic obstructive pulmonary disease, unspecified: Secondary | ICD-10-CM | POA: Diagnosis not present

## 2016-12-24 DIAGNOSIS — Z89611 Acquired absence of right leg above knee: Secondary | ICD-10-CM | POA: Diagnosis not present

## 2016-12-24 DIAGNOSIS — Z9181 History of falling: Secondary | ICD-10-CM | POA: Diagnosis not present

## 2016-12-24 DIAGNOSIS — G629 Polyneuropathy, unspecified: Secondary | ICD-10-CM | POA: Diagnosis not present

## 2016-12-31 DIAGNOSIS — Z681 Body mass index (BMI) 19 or less, adult: Secondary | ICD-10-CM | POA: Diagnosis not present

## 2016-12-31 DIAGNOSIS — M159 Polyosteoarthritis, unspecified: Secondary | ICD-10-CM | POA: Diagnosis not present

## 2016-12-31 DIAGNOSIS — G629 Polyneuropathy, unspecified: Secondary | ICD-10-CM | POA: Diagnosis not present

## 2016-12-31 DIAGNOSIS — E875 Hyperkalemia: Secondary | ICD-10-CM | POA: Diagnosis not present

## 2016-12-31 DIAGNOSIS — E7800 Pure hypercholesterolemia, unspecified: Secondary | ICD-10-CM | POA: Diagnosis not present

## 2016-12-31 DIAGNOSIS — Z89611 Acquired absence of right leg above knee: Secondary | ICD-10-CM | POA: Diagnosis not present

## 2016-12-31 DIAGNOSIS — J449 Chronic obstructive pulmonary disease, unspecified: Secondary | ICD-10-CM | POA: Diagnosis not present

## 2017-03-04 ENCOUNTER — Ambulatory Visit (INDEPENDENT_AMBULATORY_CARE_PROVIDER_SITE_OTHER): Payer: Medicare Other | Admitting: Family

## 2017-03-04 ENCOUNTER — Encounter: Payer: Self-pay | Admitting: Family

## 2017-03-04 ENCOUNTER — Ambulatory Visit (HOSPITAL_COMMUNITY)
Admission: RE | Admit: 2017-03-04 | Discharge: 2017-03-04 | Disposition: A | Discharge status: Home / Self Care | Payer: Medicare Other | Source: Ambulatory Visit | Attending: Family | Admitting: Family

## 2017-03-04 ENCOUNTER — Encounter (HOSPITAL_COMMUNITY): Payer: Self-pay

## 2017-03-04 VITALS — BP 129/76 | HR 92 | Temp 99.1°F | Resp 19 | Wt 77.0 lb

## 2017-03-04 DIAGNOSIS — I779 Disorder of arteries and arterioles, unspecified: Secondary | ICD-10-CM

## 2017-03-04 DIAGNOSIS — I70201 Unspecified atherosclerosis of native arteries of extremities, right leg: Secondary | ICD-10-CM | POA: Insufficient documentation

## 2017-03-04 DIAGNOSIS — Z89611 Acquired absence of right leg above knee: Secondary | ICD-10-CM

## 2017-03-04 DIAGNOSIS — F17200 Nicotine dependence, unspecified, uncomplicated: Secondary | ICD-10-CM | POA: Diagnosis not present

## 2017-03-04 DIAGNOSIS — Z95828 Presence of other vascular implants and grafts: Secondary | ICD-10-CM

## 2017-03-04 NOTE — Progress Notes (Signed)
[facility]    CC: Follow up peripheral artery occlusive disease  History of Present Illness Heather Jimenez is a 71 y.o. female who is s/p Excision of left femoral pseudoaneurysm and Bypass from left limb aortofemoral graft to left common femoral artery with 101mm Dacron on 12-30-15 by Dr. Donzetta Matters for pseudoaneurysm left aorto-femoral bypass.  Findings: There is a large pseudoaneurysm extending underneath the inguinal ligament down to the level of the SFA and profunda femoris arteries. The medial aspect of the anastomosis had dislodged leading to this large pseudoaneurysm. We did have a venous injury at the level of the external iliac which resolved with direct pressure. Completion of the procedure patient had her peroneal signal at the level of the foot there was a bypass from the aortofemoral to the common femoral artery with 8 mm dacron. Dr. Donzetta Matters evaluated pt on 01-17-16. At that time there was a Doppler signal at her left anterior tibial artery. He advised pt to return in 6 months with duplex of her left lower extremity.  She is alsos/p Right AKA on 12-18-14.   She is known to Dr. Oneida Alar from having undergone prior aortobifemoral bypass and right femoral popliteal bypass in 2010.  At her 12/13/14 visit she had not been seen in the office since 2011. She is no longer able to speak following laryngectomy for throat cancer. She uses a Archivist to commumicate and her history is aided by her husband.  Unfortunately she still smokes, but has decreased to 1/2 ppd. She denies any non healing wounds or problems with her left leg. She was fitted for a right AKA prosthesis from [place] and has that with her today.  She is able to bake, cook, take care of herself, stand on her left leg.  Pt Diabetic: No Pt smoker: smoker (1/2 ppd, started at age 15 yrs)  Pt meds include: Statin :No Betablocker: No ASA: Yes Other anticoagulants/antiplatelets: no     Past  Medical History:  Diagnosis Date  . Carotid bruit   . Dyslipidemia   . PAD (peripheral artery disease) ([place])   . Pneumonia   . Throat cancer ([place])   . Tobacco dependence     Social History Social History   Tobacco Use  . Smoking status: Light Tobacco Smoker    Packs/day: 1.00    Years: 35.00    Pack years: 35.00    Types: Cigarettes  . Smokeless tobacco: Never Used  Substance Use Topics  . Alcohol use: No    Alcohol/week: 0.0 oz  . Drug use: No    Family History Family History  Problem Relation Age of Onset  . Cancer Mother   . Lung cancer Brother     Past Surgical History:  Procedure Laterality Date  . AMPUTATION Right 12/18/2014   Procedure: AMPUTATION ABOVE KNEE;  Surgeon: Elam Dutch, MD;  Location: [facility];  Service: Vascular;  Laterality: Right;  . AORTA - BILATERAL FEMORAL ARTERY BYPASS GRAFT    . COLONOSCOPY    . FALSE ANEURYSM REPAIR Left 12/30/2015   Procedure: Excision of Left FEMORAL PSEUDO-ANEURYSM;  Surgeon: Waynetta Sandy, MD;  Location: [facility];  Service: Vascular;  Laterality: Left;  . FEMORAL-FEMORAL BYPASS GRAFT Left 12/30/2015   Procedure: BYPASS From Aorta-Femoral to Femoral Bypass Graft using Dacron Graft;  Surgeon: Waynetta Sandy, MD;  Location: [facility];  Service: Vascular;  Laterality: Left;  . LOWER EXTREMITY ANGIOGRAM Bilateral 12/14/2014   Procedure: Lower Extremity Angiogram;  Surgeon: Elam Dutch, MD;  Location: [place] [facility];  Service: Cardiovascular;  Laterality: Bilateral;  . PEG TUBE PLACEMENT    . PEG TUBE REMOVAL    . PERIPHERAL VASCULAR CATHETERIZATION N/A 12/14/2014   Procedure: Abdominal Aortogram;  Surgeon: Elam Dutch, MD;  Location: [facility];  Service: Cardiovascular;  Laterality: N/A;  . TONSILLECTOMY    . TOTAL LARYNGECTOMY      No Known Allergies  Current Outpatient Medications  Medication Sig Dispense Refill  . gabapentin (NEURONTIN) 400 MG capsule Take 400 mg by mouth See  admin instructions. Two to three times a day  12  . KLOR-CON M20 20 MEQ tablet Take 20 mEq by mouth 2 (two) times daily.     Marland Kitchen lisinopril (PRINIVIL,ZESTRIL) 10 MG tablet TK 1 T PO D  12   No current facility-administered medications for this visit.     ROS: See HPI for pertinent positives and negatives.   Physical Examination  Vitals:   03/04/17 0933 03/04/17 0935  BP: (!) 174/86 129/76  Pulse: 92   Resp: 19   Temp: 99.1 F (37.3 C)   TempSrc: Oral   SpO2: 97%   Weight: 77 lb (34.9 kg)    Body mass index is 13.64 kg/m.  General: A&O x 3, thin female. Gait: seated in her wheelchair Eyes: PERRLA. Pulmonary: respirations are non labored, BBS are distant, no rales, rhonchi or wheezes, tracheotomy covered by a protective cloth kept in place with ties around her neck. Cardiac: regular rhythm, no detected murmur.     Carotid Bruits Right Left   Negative Negative   Abdominal aortic pulse is not palpable. Radial pulses: radial pulses not palpable; left ulnar pulse is 1+ palpable, right brachial pulse is 1+ palpable. Both hands are pink and all fingers with brisk capillary refill.   VASCULAR EXAM: Extremitieswithout ischemic changes, without Gangrene; without open wounds. Right AKA stump is well healed, no ulcers.      LE Pulses Right Left   FEMORAL not palpable 2+ palpable    POPLITEAL AKA  not palpable   POSTERIOR TIBIAL AKA  not palpable    DORSALIS PEDIS  ANTERIOR TIBIAL AKA  not palpable    Abdomen: soft, NT, no palpable masses. Skin: no rashes, see Extremities. Musculoskeletal: no muscle wasting or atrophy. Neurologic: A&O X 3; Appropriate Affect ; SENSATION: normal; MOTOR FUNCTION: moving all extremities equally, motor strength 5/5  throughout.  CN 2-12 intact     ASSESSMENT: Heather Jimenez is a 71 y.o. female who is s/p right AKA on 12-18-14.  She is also s/p aortobifemoral bypass and right femoral popliteal bypass in 2010.  She is also s/p Excision of left femoral pseudoaneurysm and Bypass from left limb aortofemoral graft to left common femoral artery with 73mm Dacron on 12-30-15 by Dr. Donzetta Matters for pseudoaneurysm left aorto-femoral bypass. Her right AKA stump is well healed, she has a prosthesis from [place] and is ot using as it needs adjusting to fit better. She walks a great deal with her walker, has no claudication in her left leg. She has no signs of ischemia in her left foot/leg, her stump has no signs of pressure ulcers, no wounds.  Left ABI indicates moderate arterial occlusive disease.  Unfortunately she continues to smoke despite having undergone laryngectomy for throat cancer. Smoking is her primary atherosclerotic risk factor.   DATA  ABI (Date: 03/04/2017):       R: BKA  L:   ABI:  0.67 (was 0.76 on 08-19-16),   PT: waveform morphology not documented   DP: waveform morphology not documented    TBI: 0.54 Slight decline in left ABI, moderate disease.    Left LE Arterial Duplex (08-19-16): Left anastomosis measures 1.5 x 1.6 cm Known occlusion of the left SFA with reconstitution visualized above the popliteal by way of collaterals. Flow also appears by way of the profunda. Patent left limb of the aortobifemoral bypass graft. No pseudoaneurysm visualized.     PLAN:  The patient was counseled re smoking cessation and given several free resources re smoking cessation.  Continue graduated walking program. Based on the patient's vascular studies and examination, pt will return to clinic in 9 monthswith left ABI. I advised pt and her husband to notify us if she develops concerns re the circulation in her left foot or leg.    I discussed in depth with the patient the nature of  atherosclerosis, and emphasized the importance of maximal medical management including strict control of blood pressure, blood glucose, and lipid levels, obtaining regular exercise, and cessation of smoking.  The patient is aware that without maximal medical management the underlying atherosclerotic disease process will progress, limiting the benefit of any interventions.  The patient was given information about PAD including signs, symptoms, treatment, what symptoms should prompt the patient to seek immediate medical care, and risk reduction measures to take.  Clemon Chambers, RN, MSN, FNP-C [facility] Phone: [phone]  Clinic MD: [facility]  03/04/17 10:01 AM

## 2017-03-04 NOTE — Patient Instructions (Addendum)
Steps to Quit Smoking Smoking tobacco can be bad for your health. It can also affect almost every organ in your body. Smoking puts you and people around you at risk for many serious long-lasting (chronic) diseases. Quitting smoking is hard, but it is one of the best things that you can do for your health. It is never too late to quit. What are the benefits of quitting smoking? When you quit smoking, you lower your risk for getting serious diseases and conditions. They can include:  Lung cancer or lung disease.  Heart disease.  Stroke.  Heart attack.  Not being able to have children (infertility).  Weak bones (osteoporosis) and broken bones (fractures).  If you have coughing, wheezing, and shortness of breath, those symptoms may get better when you quit. You may also get sick less often. If you are pregnant, quitting smoking can help to lower your chances of having a baby of low birth weight. What can I do to help me quit smoking? Talk with your doctor about what can help you quit smoking. Some things you can do (strategies) include:  Quitting smoking totally, instead of slowly cutting back how much you smoke over a period of time.  Going to in-person counseling. You are more likely to quit if you go to many counseling sessions.  Using resources and support systems, such as: ? Online chats with a counselor. ? Phone quitlines. ? Printed self-help materials. ? Support groups or group counseling. ? Text messaging programs. ? Mobile phone apps or applications.  Taking medicines. Some of these medicines may have nicotine in them. If you are pregnant or breastfeeding, do not take any medicines to quit smoking unless your doctor says it is okay. Talk with your doctor about counseling or other things that can help you.  Talk with your doctor about using more than one strategy at the same time, such as taking medicines while you are also going to in-person counseling. This can help make  quitting easier. What things can I do to make it easier to quit? Quitting smoking might feel very hard at first, but there is a lot that you can do to make it easier. Take these steps:  Talk to your family and friends. Ask them to support and encourage you.  Call phone quitlines, reach out to support groups, or work with a counselor.  Ask people who smoke to not smoke around you.  Avoid places that make you want (trigger) to smoke, such as: ? Bars. ? Parties. ? Smoke-break areas at work.  Spend time with people who do not smoke.  Lower the stress in your life. Stress can make you want to smoke. Try these things to help your stress: ? Getting regular exercise. ? Deep-breathing exercises. ? Yoga. ? Meditating. ? Doing a body scan. To do this, close your eyes, focus on one area of your body at a time from head to toe, and notice which parts of your body are tense. Try to relax the muscles in those areas.  Download or buy apps on your mobile phone or tablet that can help you stick to your quit plan. There are many free apps, such as QuitGuide from the CDC (Centers for Disease Control and Prevention). You can find more support from smokefree.gov and other websites.  This information is not intended to replace advice given to you by your health care provider. Make sure you discuss any questions you have with your health care provider. Document Released: 12/27/2008 Document   Revised: 10/29/2015 Document Reviewed: 07/17/2014 Elsevier Interactive Patient Education  2018 Elsevier Inc.     Peripheral Vascular Disease Peripheral vascular disease (PVD) is a disease of the blood vessels that are not part of your heart and brain. A simple term for PVD is poor circulation. In most cases, PVD narrows the blood vessels that carry blood from your heart to the rest of your body. This can result in a decreased supply of blood to your arms, legs, and internal organs, like your stomach or kidneys.  However, it most often affects a person's lower legs and feet. There are two types of PVD.  Organic PVD. This is the more common type. It is caused by damage to the structure of blood vessels.  Functional PVD. This is caused by conditions that make blood vessels contract and tighten (spasm).  Without treatment, PVD tends to get worse over time. PVD can also lead to acute ischemic limb. This is when an arm or limb suddenly has trouble getting enough blood. This is a medical emergency. Follow these instructions at home:  Take medicines only as told by your doctor.  Do not use any tobacco products, including cigarettes, chewing tobacco, or electronic cigarettes. If you need help quitting, ask your doctor.  Lose weight if you are overweight, and maintain a healthy weight as told by your doctor.  Eat a diet that is low in fat and cholesterol. If you need help, ask your doctor.  Exercise regularly. Ask your doctor for some good activities for you.  Take good care of your feet. ? Wear comfortable shoes that fit well. ? Check your feet often for any cuts or sores. Contact a doctor if:  You have cramps in your legs while walking.  You have leg pain when you are at rest.  You have coldness in a leg or foot.  Your skin changes.  You are unable to get or have an erection (erectile dysfunction).  You have cuts or sores on your feet that are not healing. Get help right away if:  Your arm or leg turns cold and blue.  Your arms or legs become red, warm, swollen, painful, or numb.  You have chest pain or trouble breathing.  You suddenly have weakness in your face, arm, or leg.  You become very confused or you cannot speak.  You suddenly have a very bad headache.  You suddenly cannot see. This information is not intended to replace advice given to you by your health care provider. Make sure you discuss any questions you have with your health care provider. Document Released:  05/27/2009 Document Revised: 08/08/2015 Document Reviewed: 08/10/2013 Elsevier Interactive Patient Education  2017 Elsevier Inc.  

## 2017-03-08 NOTE — Addendum Note (Signed)
Addended by: Lianne Cure A on: 03/08/2017 10:24 AM   Modules accepted: Orders

## 2017-03-29 DIAGNOSIS — E875 Hyperkalemia: Secondary | ICD-10-CM | POA: Diagnosis not present

## 2017-03-29 DIAGNOSIS — C140 Malignant neoplasm of pharynx, unspecified: Secondary | ICD-10-CM | POA: Diagnosis not present

## 2017-03-29 DIAGNOSIS — Z89611 Acquired absence of right leg above knee: Secondary | ICD-10-CM | POA: Diagnosis not present

## 2017-03-29 DIAGNOSIS — I10 Essential (primary) hypertension: Secondary | ICD-10-CM | POA: Diagnosis not present

## 2017-03-29 DIAGNOSIS — G629 Polyneuropathy, unspecified: Secondary | ICD-10-CM | POA: Diagnosis not present

## 2017-03-29 DIAGNOSIS — J449 Chronic obstructive pulmonary disease, unspecified: Secondary | ICD-10-CM | POA: Diagnosis not present

## 2017-03-29 DIAGNOSIS — R600 Localized edema: Secondary | ICD-10-CM | POA: Diagnosis not present

## 2017-03-29 DIAGNOSIS — E7800 Pure hypercholesterolemia, unspecified: Secondary | ICD-10-CM | POA: Diagnosis not present

## 2017-03-29 DIAGNOSIS — I739 Peripheral vascular disease, unspecified: Secondary | ICD-10-CM | POA: Diagnosis not present

## 2017-03-29 DIAGNOSIS — R636 Underweight: Secondary | ICD-10-CM | POA: Diagnosis not present

## 2017-03-29 DIAGNOSIS — M159 Polyosteoarthritis, unspecified: Secondary | ICD-10-CM | POA: Diagnosis not present

## 2017-03-29 DIAGNOSIS — E46 Unspecified protein-calorie malnutrition: Secondary | ICD-10-CM | POA: Diagnosis not present

## 2017-06-29 DIAGNOSIS — Z89611 Acquired absence of right leg above knee: Secondary | ICD-10-CM | POA: Diagnosis not present

## 2017-06-29 DIAGNOSIS — J449 Chronic obstructive pulmonary disease, unspecified: Secondary | ICD-10-CM | POA: Diagnosis not present

## 2017-06-29 DIAGNOSIS — C140 Malignant neoplasm of pharynx, unspecified: Secondary | ICD-10-CM | POA: Diagnosis not present

## 2017-06-29 DIAGNOSIS — E46 Unspecified protein-calorie malnutrition: Secondary | ICD-10-CM | POA: Diagnosis not present

## 2017-06-29 DIAGNOSIS — R600 Localized edema: Secondary | ICD-10-CM | POA: Diagnosis not present

## 2017-06-29 DIAGNOSIS — E7800 Pure hypercholesterolemia, unspecified: Secondary | ICD-10-CM | POA: Diagnosis not present

## 2017-06-29 DIAGNOSIS — E875 Hyperkalemia: Secondary | ICD-10-CM | POA: Diagnosis not present

## 2017-06-29 DIAGNOSIS — G629 Polyneuropathy, unspecified: Secondary | ICD-10-CM | POA: Diagnosis not present

## 2017-06-29 DIAGNOSIS — M159 Polyosteoarthritis, unspecified: Secondary | ICD-10-CM | POA: Diagnosis not present

## 2017-06-29 DIAGNOSIS — I739 Peripheral vascular disease, unspecified: Secondary | ICD-10-CM | POA: Diagnosis not present

## 2017-06-29 DIAGNOSIS — I10 Essential (primary) hypertension: Secondary | ICD-10-CM | POA: Diagnosis not present

## 2017-06-29 DIAGNOSIS — Z720 Tobacco use: Secondary | ICD-10-CM | POA: Diagnosis not present

## 2017-08-03 DIAGNOSIS — Z9002 Acquired absence of larynx: Secondary | ICD-10-CM | POA: Diagnosis not present

## 2017-08-03 DIAGNOSIS — Z85118 Personal history of other malignant neoplasm of bronchus and lung: Secondary | ICD-10-CM | POA: Diagnosis not present

## 2017-08-03 DIAGNOSIS — Z9089 Acquired absence of other organs: Secondary | ICD-10-CM | POA: Diagnosis not present

## 2017-08-03 DIAGNOSIS — Z08 Encounter for follow-up examination after completed treatment for malignant neoplasm: Secondary | ICD-10-CM | POA: Diagnosis not present

## 2017-08-03 DIAGNOSIS — Z9889 Other specified postprocedural states: Secondary | ICD-10-CM | POA: Diagnosis not present

## 2017-08-03 DIAGNOSIS — Z85818 Personal history of malignant neoplasm of other sites of lip, oral cavity, and pharynx: Secondary | ICD-10-CM | POA: Diagnosis not present

## 2017-08-03 DIAGNOSIS — Z8521 Personal history of malignant neoplasm of larynx: Secondary | ICD-10-CM | POA: Diagnosis not present

## 2017-08-03 DIAGNOSIS — Z923 Personal history of irradiation: Secondary | ICD-10-CM | POA: Diagnosis not present

## 2017-08-03 DIAGNOSIS — Z85819 Personal history of malignant neoplasm of unspecified site of lip, oral cavity, and pharynx: Secondary | ICD-10-CM | POA: Diagnosis not present

## 2017-09-29 DIAGNOSIS — C140 Malignant neoplasm of pharynx, unspecified: Secondary | ICD-10-CM | POA: Diagnosis not present

## 2017-09-29 DIAGNOSIS — J449 Chronic obstructive pulmonary disease, unspecified: Secondary | ICD-10-CM | POA: Diagnosis not present

## 2017-09-29 DIAGNOSIS — Z89611 Acquired absence of right leg above knee: Secondary | ICD-10-CM | POA: Diagnosis not present

## 2017-09-29 DIAGNOSIS — E7800 Pure hypercholesterolemia, unspecified: Secondary | ICD-10-CM | POA: Diagnosis not present

## 2017-09-29 DIAGNOSIS — I10 Essential (primary) hypertension: Secondary | ICD-10-CM | POA: Diagnosis not present

## 2017-09-29 DIAGNOSIS — I739 Peripheral vascular disease, unspecified: Secondary | ICD-10-CM | POA: Diagnosis not present

## 2017-10-20 DIAGNOSIS — Z0000 Encounter for general adult medical examination without abnormal findings: Secondary | ICD-10-CM | POA: Diagnosis not present

## 2017-10-20 DIAGNOSIS — Z9181 History of falling: Secondary | ICD-10-CM | POA: Diagnosis not present

## 2017-10-20 DIAGNOSIS — Z1339 Encounter for screening examination for other mental health and behavioral disorders: Secondary | ICD-10-CM | POA: Diagnosis not present

## 2017-10-20 DIAGNOSIS — Z139 Encounter for screening, unspecified: Secondary | ICD-10-CM | POA: Diagnosis not present

## 2017-10-20 DIAGNOSIS — E785 Hyperlipidemia, unspecified: Secondary | ICD-10-CM | POA: Diagnosis not present

## 2017-12-03 ENCOUNTER — Ambulatory Visit: Payer: Medicare Other | Admitting: Family

## 2017-12-03 ENCOUNTER — Encounter: Payer: Self-pay | Admitting: Family

## 2017-12-03 ENCOUNTER — Encounter (HOSPITAL_COMMUNITY): Payer: Medicare Other

## 2017-12-07 ENCOUNTER — Ambulatory Visit (INDEPENDENT_AMBULATORY_CARE_PROVIDER_SITE_OTHER): Payer: Medicare Other | Admitting: Family

## 2017-12-07 ENCOUNTER — Ambulatory Visit (HOSPITAL_COMMUNITY)
Admission: RE | Admit: 2017-12-07 | Discharge: 2017-12-07 | Disposition: A | Discharge status: Home / Self Care | Payer: Medicare Other | Source: Ambulatory Visit | Attending: Family | Admitting: Family

## 2017-12-07 ENCOUNTER — Encounter: Payer: Self-pay | Admitting: Family

## 2017-12-07 VITALS — BP 155/84 | HR 76 | Temp 98.0°F | Resp 18

## 2017-12-07 DIAGNOSIS — Z89611 Acquired absence of right leg above knee: Secondary | ICD-10-CM

## 2017-12-07 DIAGNOSIS — I779 Disorder of arteries and arterioles, unspecified: Secondary | ICD-10-CM

## 2017-12-07 DIAGNOSIS — Z95828 Presence of other vascular implants and grafts: Secondary | ICD-10-CM

## 2017-12-07 DIAGNOSIS — F17200 Nicotine dependence, unspecified, uncomplicated: Secondary | ICD-10-CM | POA: Diagnosis not present

## 2017-12-07 DIAGNOSIS — I10 Essential (primary) hypertension: Secondary | ICD-10-CM | POA: Insufficient documentation

## 2017-12-07 NOTE — Patient Instructions (Signed)
Steps to Quit Smoking Smoking tobacco can be bad for your health. It can also affect almost every organ in your body. Smoking puts you and people around you at risk for many serious long-lasting (chronic) diseases. Quitting smoking is hard, but it is one of the best things that you can do for your health. It is never too late to quit. What are the benefits of quitting smoking? When you quit smoking, you lower your risk for getting serious diseases and conditions. They can include:  Lung cancer or lung disease.  Heart disease.  Stroke.  Heart attack.  Not being able to have children (infertility).  Weak bones (osteoporosis) and broken bones (fractures).  If you have coughing, wheezing, and shortness of breath, those symptoms may get better when you quit. You may also get sick less often. If you are pregnant, quitting smoking can help to lower your chances of having a baby of low birth weight. What can I do to help me quit smoking? Talk with your doctor about what can help you quit smoking. Some things you can do (strategies) include:  Quitting smoking totally, instead of slowly cutting back how much you smoke over a period of time.  Going to in-person counseling. You are more likely to quit if you go to many counseling sessions.  Using resources and support systems, such as: ? Online chats with a counselor. ? Phone quitlines. ? Printed self-help materials. ? Support groups or group counseling. ? Text messaging programs. ? Mobile phone apps or applications.  Taking medicines. Some of these medicines may have nicotine in them. If you are pregnant or breastfeeding, do not take any medicines to quit smoking unless your doctor says it is okay. Talk with your doctor about counseling or other things that can help you.  Talk with your doctor about using more than one strategy at the same time, such as taking medicines while you are also going to in-person counseling. This can help make  quitting easier. What things can I do to make it easier to quit? Quitting smoking might feel very hard at first, but there is a lot that you can do to make it easier. Take these steps:  Talk to your family and friends. Ask them to support and encourage you.  Call phone quitlines, reach out to support groups, or work with a counselor.  Ask people who smoke to not smoke around you.  Avoid places that make you want (trigger) to smoke, such as: ? Bars. ? Parties. ? Smoke-break areas at work.  Spend time with people who do not smoke.  Lower the stress in your life. Stress can make you want to smoke. Try these things to help your stress: ? Getting regular exercise. ? Deep-breathing exercises. ? Yoga. ? Meditating. ? Doing a body scan. To do this, close your eyes, focus on one area of your body at a time from head to toe, and notice which parts of your body are tense. Try to relax the muscles in those areas.  Download or buy apps on your mobile phone or tablet that can help you stick to your quit plan. There are many free apps, such as QuitGuide from the CDC (Centers for Disease Control and Prevention). You can find more support from smokefree.gov and other websites.  This information is not intended to replace advice given to you by your health care provider. Make sure you discuss any questions you have with your health care provider. Document Released: 12/27/2008 Document   Revised: 10/29/2015 Document Reviewed: 07/17/2014 Elsevier Interactive Patient Education  2018 Elsevier Inc.     Peripheral Vascular Disease Peripheral vascular disease (PVD) is a disease of the blood vessels that are not part of your heart and brain. A simple term for PVD is poor circulation. In most cases, PVD narrows the blood vessels that carry blood from your heart to the rest of your body. This can result in a decreased supply of blood to your arms, legs, and internal organs, like your stomach or kidneys.  However, it most often affects a person's lower legs and feet. There are two types of PVD.  Organic PVD. This is the more common type. It is caused by damage to the structure of blood vessels.  Functional PVD. This is caused by conditions that make blood vessels contract and tighten (spasm).  Without treatment, PVD tends to get worse over time. PVD can also lead to acute ischemic limb. This is when an arm or limb suddenly has trouble getting enough blood. This is a medical emergency. Follow these instructions at home:  Take medicines only as told by your doctor.  Do not use any tobacco products, including cigarettes, chewing tobacco, or electronic cigarettes. If you need help quitting, ask your doctor.  Lose weight if you are overweight, and maintain a healthy weight as told by your doctor.  Eat a diet that is low in fat and cholesterol. If you need help, ask your doctor.  Exercise regularly. Ask your doctor for some good activities for you.  Take good care of your feet. ? Wear comfortable shoes that fit well. ? Check your feet often for any cuts or sores. Contact a doctor if:  You have cramps in your legs while walking.  You have leg pain when you are at rest.  You have coldness in a leg or foot.  Your skin changes.  You are unable to get or have an erection (erectile dysfunction).  You have cuts or sores on your feet that are not healing. Get help right away if:  Your arm or leg turns cold and blue.  Your arms or legs become red, warm, swollen, painful, or numb.  You have chest pain or trouble breathing.  You suddenly have weakness in your face, arm, or leg.  You become very confused or you cannot speak.  You suddenly have a very bad headache.  You suddenly cannot see. This information is not intended to replace advice given to you by your health care provider. Make sure you discuss any questions you have with your health care provider. Document Released:  05/27/2009 Document Revised: 08/08/2015 Document Reviewed: 08/10/2013 Elsevier Interactive Patient Education  2017 Elsevier Inc.  

## 2017-12-07 NOTE — Progress Notes (Signed)
[facility] [place]   CC: Follow up peripheral artery occlusive disease  History of Present Illness Heather Jimenez is a 72 y.o. female who is s/pExcision of left femoral pseudoaneurysmandBypass from left limb aortofemoral graft to left common femoral artery with 28mmDacron on 12-30-15 by Dr. Donzetta Matters forpseudoaneurysm left aorto-femoral bypass. Findings: There is a large pseudoaneurysm extending underneath the inguinal ligament down to the level of the SFA and profunda femoris arteries. The medial aspect of the anastomosis had dislodged leading to this large pseudoaneurysm. We did have a venous injury at the level of the external iliac which resolved with direct pressure. Completion of the procedure patient had her peroneal signal at the level of the foot there was a bypass from the aortofemoral to the common femoral artery with 8 mm dacron. Dr. Donzetta Matters evaluated pt on 01-17-16. At that time there was a Dopplersignal at her left anterior tibial artery.He advised pt to returnin 6 months with duplex of her left lower extremity.  She is alsos/p Right AKA on 12-18-14.   She is known to Dr. Oneida Alar from having undergone prior aortobifemoral bypass and right femoral popliteal bypass in 2010.  At her 12/13/14 visit she had not been seen in the office since 2011. She is no longer able to speak following laryngectomy for throat cancer. She uses awriting board to commumicateand her history is aided by her husband.  Unfortunately she still smokes, but has decreased to 1/2 ppd. She denies any non healing wounds or problems with her left leg. Shewasfitted for a right AKA prosthesis from [employer] and has that with her today.  She is able to bake, cook, take care of herself, stand on her left leg.  Pt Diabetic: No Pt smoker: smoker (1 ppd, started at age 32 yrs)  Pt meds include: Statin :No Betablocker: No ASA: Yes   Past Medical History:  Diagnosis Date  . Carotid  bruit   . Dyslipidemia   . PAD (peripheral artery disease) ([place])   . Pneumonia   . Throat cancer ([place])   . Tobacco dependence     Social History Social History   Tobacco Use  . Smoking status: Light Tobacco Smoker    Packs/day: 1.00    Years: 35.00    Pack years: 35.00    Types: Cigarettes  . Smokeless tobacco: Never Used  Substance Use Topics  . Alcohol use: No    Alcohol/week: 0.0 standard drinks  . Drug use: No    Family History Family History  Problem Relation Age of Onset  . Cancer Mother   . Lung cancer Brother     Past Surgical History:  Procedure Laterality Date  . AMPUTATION Right 12/18/2014   Procedure: AMPUTATION ABOVE KNEE;  Surgeon: Elam Dutch, MD;  Location: [facility];  Service: Vascular;  Laterality: Right;  . AORTA - BILATERAL FEMORAL ARTERY BYPASS GRAFT    . COLONOSCOPY    . FALSE ANEURYSM REPAIR Left 12/30/2015   Procedure: Excision of Left FEMORAL PSEUDO-ANEURYSM;  Surgeon: Waynetta Sandy, MD;  Location: [facility];  Service: Vascular;  Laterality: Left;  . FEMORAL-FEMORAL BYPASS GRAFT Left 12/30/2015   Procedure: BYPASS From Aorta-Femoral to Femoral Bypass Graft using Dacron Graft;  Surgeon: Waynetta Sandy, MD;  Location: [place] [facility];  Service: Vascular;  Laterality: Left;  . LOWER EXTREMITY ANGIOGRAM Bilateral 12/14/2014   Procedure: Lower Extremity Angiogram;  Surgeon: Elam Dutch, MD;  Location: [facility];  Service: Cardiovascular;  Laterality: Bilateral;  .  PEG TUBE PLACEMENT    . PEG TUBE REMOVAL    . PERIPHERAL VASCULAR CATHETERIZATION N/A 12/14/2014   Procedure: Abdominal Aortogram;  Surgeon: Elam Dutch, MD;  Location: [facility];  Service: Cardiovascular;  Laterality: N/A;  . TONSILLECTOMY    . TOTAL LARYNGECTOMY      No Known Allergies  Current Outpatient Medications  Medication Sig Dispense Refill  . aspirin EC 81 MG tablet Take by mouth.    . gabapentin (NEURONTIN) 400 MG capsule Take 400 mg by  mouth See admin instructions. Two to three times a day  12  . KLOR-CON M20 20 MEQ tablet Take 20 mEq by mouth 2 (two) times daily.     Marland Kitchen lisinopril (PRINIVIL,ZESTRIL) 10 MG tablet TK 1 T PO D  12   No current facility-administered medications for this visit.     ROS: See HPI for pertinent positives and negatives.   Physical Examination  Vitals:   12/07/17 1242  BP: (!) 155/84  Pulse: 76  Resp: 18  Temp: 98 F (36.7 C)  TempSrc: Oral  SpO2: 97%   There is no height or weight on file to calculate BMI.  General:  A&O x 3, thin female, accompanied by her husband.  Gait: seated in her wheelchair Eyes: PERRLA. Pulmonary: respirations are non labored, BBS are distant, no rales, rhonchi, or wheezes, tracheotomy present. Cardiac: regular rhythm, no detected murmur.     Carotid Bruits Right Left   Negative Negative   Abdominal aortic pulse is not palpable. Radial pulses: radial pulses not palpable; left ulnar pulse is 1+ palpable, right brachial pulse is 1+ palpable. Both hands are pink and all fingers with brisk capillary refill.   VASCULAR EXAM: Extremitieswithout ischemic changes, without Gangrene; without open wounds. Right AKA stump is well healed, no ulcers.      LE Pulses Right Left   FEMORAL not palpable Faintly palpable    POPLITEAL AKA  not palpable   POSTERIOR TIBIAL AKA  not palpable    DORSALIS PEDIS  ANTERIOR TIBIAL AKA  not palpable    Abdomen: soft, NT, no palpable masses. Musculoskeletal: no muscle wasting or atrophy.See Extremities.  Skin: no rashes, no cellulitis, no ulcers noted. Neurologic: A&O X 3; appropriate affect, Sensation is normal; MOTOR FUNCTION:  moving all extremities equally, motor strength 5/5 throughout.  Speech is fluent/normal. CN 2-12 intact. Psychiatric: Thought content is normal, mood appropriate for clinical situation.     ASSESSMENT: Heather Jimenez is a 72 y.o. female who is s/p right AKA on 12-18-14. She is also s/p aortobifemoral bypass and right femoral popliteal bypass in 2010.  She is also s/pExcision of left femoral pseudoaneurysmandBypass from left limb aortofemoral graft to left common femoral artery with 52mmDacron on 12-30-15 by Dr. Donzetta Matters forpseudoaneurysm left aorto-femoral bypass. Her right AKA stump is well healed, shehas a prosthesis from [place] and is not using as it. She walks a great deal with her walker, has no claudication in her left leg. She has no signs of ischemia in her left foot/leg, her stump has no signs of pressure ulcers, no wounds.  Left ABI indicatesmoderatearterial occlusive disease.  Unfortunately she continues to smoke despite having undergone laryngectomy for throat cancer. Smoking is her primary atherosclerotic risk factor.   DATA  ABI (Date: 12/07/2017):   R: AKA  L:   ABI: 0.74 (was 0.67 on 03-04-17),   PT: mono  DP: mono  TBI: 0.72, toe pressure 108 (was 0.54) Right AKA. Improvement  in left ABI and TBI, excellent toe pressure at 108. All monophasic waveforms.    Left LE Arterial Duplex (08-19-16): Left anastomosis measures 1.5 x 1.6 cm Known occlusion of the left SFA with reconstitution visualized above the popliteal by way of collaterals. Flow also appears by way of the profunda. Patent left limb of the aortobifemoral bypass graft. No pseudoaneurysm visualized.     PLAN:  The patient was counseled re smoking cessation but declined free resources re smoking cessation.  Continue exercising left leg daily.  Based on the patient's vascular studies and examination, pt will return to clinic in 1 yearwith left ABI.I advised pt and her husband to notify us if she develops concerns re the circulation in her left  foot or leg.    I discussed in depth with the patient the nature of atherosclerosis, and emphasized the importance of maximal medical management including strict control of blood pressure, blood glucose, and lipid levels, obtaining regular exercise, and cessation of smoking.  The patient is aware that without maximal medical management the underlying atherosclerotic disease process will progress, limiting the benefit of any interventions.  The patient was given information about PAD including signs, symptoms, treatment, what symptoms should prompt the patient to seek immediate medical care, and risk reduction measures to take.  Clemon Chambers, RN, MSN, FNP-C [facility] Phone: [phone]  Clinic MD: Bishop Dublin  12/07/17 1:22 PM

## 2017-12-30 DIAGNOSIS — Z720 Tobacco use: Secondary | ICD-10-CM | POA: Diagnosis not present

## 2017-12-30 DIAGNOSIS — E7800 Pure hypercholesterolemia, unspecified: Secondary | ICD-10-CM | POA: Diagnosis not present

## 2017-12-30 DIAGNOSIS — C140 Malignant neoplasm of pharynx, unspecified: Secondary | ICD-10-CM | POA: Diagnosis not present

## 2017-12-30 DIAGNOSIS — J449 Chronic obstructive pulmonary disease, unspecified: Secondary | ICD-10-CM | POA: Diagnosis not present

## 2017-12-30 DIAGNOSIS — I739 Peripheral vascular disease, unspecified: Secondary | ICD-10-CM | POA: Diagnosis not present

## 2017-12-30 DIAGNOSIS — I10 Essential (primary) hypertension: Secondary | ICD-10-CM | POA: Diagnosis not present

## 2017-12-30 DIAGNOSIS — Z89611 Acquired absence of right leg above knee: Secondary | ICD-10-CM | POA: Diagnosis not present

## 2018-04-01 DIAGNOSIS — J449 Chronic obstructive pulmonary disease, unspecified: Secondary | ICD-10-CM | POA: Diagnosis not present

## 2018-04-01 DIAGNOSIS — C140 Malignant neoplasm of pharynx, unspecified: Secondary | ICD-10-CM | POA: Diagnosis not present

## 2018-04-01 DIAGNOSIS — I739 Peripheral vascular disease, unspecified: Secondary | ICD-10-CM | POA: Diagnosis not present

## 2018-04-01 DIAGNOSIS — Z89611 Acquired absence of right leg above knee: Secondary | ICD-10-CM | POA: Diagnosis not present

## 2018-04-01 DIAGNOSIS — Z720 Tobacco use: Secondary | ICD-10-CM | POA: Diagnosis not present

## 2018-04-01 DIAGNOSIS — E7800 Pure hypercholesterolemia, unspecified: Secondary | ICD-10-CM | POA: Diagnosis not present

## 2018-04-01 DIAGNOSIS — I10 Essential (primary) hypertension: Secondary | ICD-10-CM | POA: Diagnosis not present

## 2018-04-15 DIAGNOSIS — J449 Chronic obstructive pulmonary disease, unspecified: Secondary | ICD-10-CM | POA: Diagnosis not present

## 2018-04-15 DIAGNOSIS — C140 Malignant neoplasm of pharynx, unspecified: Secondary | ICD-10-CM | POA: Diagnosis not present

## 2018-04-15 DIAGNOSIS — Z720 Tobacco use: Secondary | ICD-10-CM | POA: Diagnosis not present

## 2018-04-15 DIAGNOSIS — E871 Hypo-osmolality and hyponatremia: Secondary | ICD-10-CM | POA: Diagnosis not present

## 2018-04-15 DIAGNOSIS — I739 Peripheral vascular disease, unspecified: Secondary | ICD-10-CM | POA: Diagnosis not present

## 2018-07-04 DIAGNOSIS — I10 Essential (primary) hypertension: Secondary | ICD-10-CM | POA: Diagnosis not present

## 2018-07-04 DIAGNOSIS — E7800 Pure hypercholesterolemia, unspecified: Secondary | ICD-10-CM | POA: Diagnosis not present

## 2018-07-04 DIAGNOSIS — I739 Peripheral vascular disease, unspecified: Secondary | ICD-10-CM | POA: Diagnosis not present

## 2018-07-04 DIAGNOSIS — C140 Malignant neoplasm of pharynx, unspecified: Secondary | ICD-10-CM | POA: Diagnosis not present

## 2018-07-04 DIAGNOSIS — J449 Chronic obstructive pulmonary disease, unspecified: Secondary | ICD-10-CM | POA: Diagnosis not present

## 2018-07-04 DIAGNOSIS — E871 Hypo-osmolality and hyponatremia: Secondary | ICD-10-CM | POA: Diagnosis not present

## 2018-10-03 DIAGNOSIS — J449 Chronic obstructive pulmonary disease, unspecified: Secondary | ICD-10-CM | POA: Diagnosis not present

## 2018-10-03 DIAGNOSIS — I739 Peripheral vascular disease, unspecified: Secondary | ICD-10-CM | POA: Diagnosis not present

## 2018-10-03 DIAGNOSIS — E7800 Pure hypercholesterolemia, unspecified: Secondary | ICD-10-CM | POA: Diagnosis not present

## 2018-10-03 DIAGNOSIS — I10 Essential (primary) hypertension: Secondary | ICD-10-CM | POA: Diagnosis not present

## 2018-10-03 DIAGNOSIS — Z720 Tobacco use: Secondary | ICD-10-CM | POA: Diagnosis not present

## 2018-11-01 DIAGNOSIS — Z1211 Encounter for screening for malignant neoplasm of colon: Secondary | ICD-10-CM | POA: Diagnosis not present

## 2018-11-01 DIAGNOSIS — N959 Unspecified menopausal and perimenopausal disorder: Secondary | ICD-10-CM | POA: Diagnosis not present

## 2018-11-01 DIAGNOSIS — Z0000 Encounter for general adult medical examination without abnormal findings: Secondary | ICD-10-CM | POA: Diagnosis not present

## 2018-11-01 DIAGNOSIS — Z9181 History of falling: Secondary | ICD-10-CM | POA: Diagnosis not present

## 2018-11-01 DIAGNOSIS — Z1231 Encounter for screening mammogram for malignant neoplasm of breast: Secondary | ICD-10-CM | POA: Diagnosis not present

## 2018-11-01 DIAGNOSIS — E785 Hyperlipidemia, unspecified: Secondary | ICD-10-CM | POA: Diagnosis not present

## 2019-01-05 DIAGNOSIS — I10 Essential (primary) hypertension: Secondary | ICD-10-CM | POA: Diagnosis not present

## 2019-01-05 DIAGNOSIS — Z720 Tobacco use: Secondary | ICD-10-CM | POA: Diagnosis not present

## 2019-01-05 DIAGNOSIS — J449 Chronic obstructive pulmonary disease, unspecified: Secondary | ICD-10-CM | POA: Diagnosis not present

## 2019-01-05 DIAGNOSIS — Z139 Encounter for screening, unspecified: Secondary | ICD-10-CM | POA: Diagnosis not present

## 2019-01-05 DIAGNOSIS — E871 Hypo-osmolality and hyponatremia: Secondary | ICD-10-CM | POA: Diagnosis not present

## 2019-01-05 DIAGNOSIS — I739 Peripheral vascular disease, unspecified: Secondary | ICD-10-CM | POA: Diagnosis not present

## 2019-01-05 DIAGNOSIS — C140 Malignant neoplasm of pharynx, unspecified: Secondary | ICD-10-CM | POA: Diagnosis not present

## 2019-01-05 DIAGNOSIS — Z23 Encounter for immunization: Secondary | ICD-10-CM | POA: Diagnosis not present

## 2019-07-05 DIAGNOSIS — E46 Unspecified protein-calorie malnutrition: Secondary | ICD-10-CM | POA: Diagnosis not present

## 2019-07-05 DIAGNOSIS — G629 Polyneuropathy, unspecified: Secondary | ICD-10-CM | POA: Diagnosis not present

## 2019-07-05 DIAGNOSIS — I739 Peripheral vascular disease, unspecified: Secondary | ICD-10-CM | POA: Diagnosis not present

## 2019-07-05 DIAGNOSIS — C140 Malignant neoplasm of pharynx, unspecified: Secondary | ICD-10-CM | POA: Diagnosis not present

## 2019-07-05 DIAGNOSIS — M159 Polyosteoarthritis, unspecified: Secondary | ICD-10-CM | POA: Diagnosis not present

## 2019-07-05 DIAGNOSIS — J449 Chronic obstructive pulmonary disease, unspecified: Secondary | ICD-10-CM | POA: Diagnosis not present

## 2019-07-05 DIAGNOSIS — Z89611 Acquired absence of right leg above knee: Secondary | ICD-10-CM | POA: Diagnosis not present

## 2019-07-05 DIAGNOSIS — R600 Localized edema: Secondary | ICD-10-CM | POA: Diagnosis not present

## 2019-07-05 DIAGNOSIS — I10 Essential (primary) hypertension: Secondary | ICD-10-CM | POA: Diagnosis not present

## 2019-07-05 DIAGNOSIS — E7800 Pure hypercholesterolemia, unspecified: Secondary | ICD-10-CM | POA: Diagnosis not present

## 2019-07-05 DIAGNOSIS — E871 Hypo-osmolality and hyponatremia: Secondary | ICD-10-CM | POA: Diagnosis not present

## 2019-10-06 DIAGNOSIS — M659 Synovitis and tenosynovitis, unspecified: Secondary | ICD-10-CM | POA: Diagnosis not present

## 2019-10-06 DIAGNOSIS — I10 Essential (primary) hypertension: Secondary | ICD-10-CM | POA: Diagnosis not present

## 2019-10-06 DIAGNOSIS — J449 Chronic obstructive pulmonary disease, unspecified: Secondary | ICD-10-CM | POA: Diagnosis not present

## 2019-10-06 DIAGNOSIS — C140 Malignant neoplasm of pharynx, unspecified: Secondary | ICD-10-CM | POA: Diagnosis not present

## 2019-10-06 DIAGNOSIS — I739 Peripheral vascular disease, unspecified: Secondary | ICD-10-CM | POA: Diagnosis not present

## 2019-10-06 DIAGNOSIS — E7800 Pure hypercholesterolemia, unspecified: Secondary | ICD-10-CM | POA: Diagnosis not present

## 2019-10-20 DIAGNOSIS — E871 Hypo-osmolality and hyponatremia: Secondary | ICD-10-CM | POA: Diagnosis not present

## 2019-10-20 DIAGNOSIS — C140 Malignant neoplasm of pharynx, unspecified: Secondary | ICD-10-CM | POA: Diagnosis not present

## 2019-10-20 DIAGNOSIS — G629 Polyneuropathy, unspecified: Secondary | ICD-10-CM | POA: Diagnosis not present

## 2019-10-20 DIAGNOSIS — Z89611 Acquired absence of right leg above knee: Secondary | ICD-10-CM | POA: Diagnosis not present

## 2019-10-20 DIAGNOSIS — E46 Unspecified protein-calorie malnutrition: Secondary | ICD-10-CM | POA: Diagnosis not present

## 2019-10-20 DIAGNOSIS — I739 Peripheral vascular disease, unspecified: Secondary | ICD-10-CM | POA: Diagnosis not present

## 2019-10-20 DIAGNOSIS — J449 Chronic obstructive pulmonary disease, unspecified: Secondary | ICD-10-CM | POA: Diagnosis not present

## 2019-10-20 DIAGNOSIS — M159 Polyosteoarthritis, unspecified: Secondary | ICD-10-CM | POA: Diagnosis not present

## 2019-10-20 DIAGNOSIS — R600 Localized edema: Secondary | ICD-10-CM | POA: Diagnosis not present

## 2019-11-03 DIAGNOSIS — Z9181 History of falling: Secondary | ICD-10-CM | POA: Diagnosis not present

## 2019-11-03 DIAGNOSIS — Z1331 Encounter for screening for depression: Secondary | ICD-10-CM | POA: Diagnosis not present

## 2019-11-03 DIAGNOSIS — Z0000 Encounter for general adult medical examination without abnormal findings: Secondary | ICD-10-CM | POA: Diagnosis not present

## 2019-11-03 DIAGNOSIS — E785 Hyperlipidemia, unspecified: Secondary | ICD-10-CM | POA: Diagnosis not present

## 2019-12-14 DIAGNOSIS — Z89611 Acquired absence of right leg above knee: Secondary | ICD-10-CM | POA: Diagnosis not present

## 2019-12-14 DIAGNOSIS — C140 Malignant neoplasm of pharynx, unspecified: Secondary | ICD-10-CM | POA: Diagnosis not present

## 2019-12-14 DIAGNOSIS — J449 Chronic obstructive pulmonary disease, unspecified: Secondary | ICD-10-CM | POA: Diagnosis not present

## 2019-12-14 DIAGNOSIS — I739 Peripheral vascular disease, unspecified: Secondary | ICD-10-CM | POA: Diagnosis not present

## 2019-12-14 DIAGNOSIS — E871 Hypo-osmolality and hyponatremia: Secondary | ICD-10-CM | POA: Diagnosis not present

## 2019-12-14 DIAGNOSIS — E46 Unspecified protein-calorie malnutrition: Secondary | ICD-10-CM | POA: Diagnosis not present

## 2019-12-14 DIAGNOSIS — M159 Polyosteoarthritis, unspecified: Secondary | ICD-10-CM | POA: Diagnosis not present

## 2019-12-14 DIAGNOSIS — R600 Localized edema: Secondary | ICD-10-CM | POA: Diagnosis not present

## 2019-12-14 DIAGNOSIS — L0291 Cutaneous abscess, unspecified: Secondary | ICD-10-CM | POA: Diagnosis not present

## 2019-12-15 DIAGNOSIS — J449 Chronic obstructive pulmonary disease, unspecified: Secondary | ICD-10-CM | POA: Diagnosis not present

## 2019-12-15 DIAGNOSIS — Z89611 Acquired absence of right leg above knee: Secondary | ICD-10-CM | POA: Diagnosis not present

## 2019-12-15 DIAGNOSIS — M159 Polyosteoarthritis, unspecified: Secondary | ICD-10-CM | POA: Diagnosis not present

## 2019-12-15 DIAGNOSIS — C140 Malignant neoplasm of pharynx, unspecified: Secondary | ICD-10-CM | POA: Diagnosis not present

## 2019-12-15 DIAGNOSIS — I739 Peripheral vascular disease, unspecified: Secondary | ICD-10-CM | POA: Diagnosis not present

## 2019-12-15 DIAGNOSIS — E871 Hypo-osmolality and hyponatremia: Secondary | ICD-10-CM | POA: Diagnosis not present

## 2019-12-15 DIAGNOSIS — E46 Unspecified protein-calorie malnutrition: Secondary | ICD-10-CM | POA: Diagnosis not present

## 2019-12-15 DIAGNOSIS — R600 Localized edema: Secondary | ICD-10-CM | POA: Diagnosis not present

## 2019-12-15 DIAGNOSIS — L0291 Cutaneous abscess, unspecified: Secondary | ICD-10-CM | POA: Diagnosis not present

## 2019-12-19 DIAGNOSIS — Z89611 Acquired absence of right leg above knee: Secondary | ICD-10-CM | POA: Diagnosis not present

## 2019-12-19 DIAGNOSIS — L0291 Cutaneous abscess, unspecified: Secondary | ICD-10-CM | POA: Diagnosis not present

## 2019-12-19 DIAGNOSIS — J449 Chronic obstructive pulmonary disease, unspecified: Secondary | ICD-10-CM | POA: Diagnosis not present

## 2019-12-19 DIAGNOSIS — C140 Malignant neoplasm of pharynx, unspecified: Secondary | ICD-10-CM | POA: Diagnosis not present

## 2019-12-19 DIAGNOSIS — E871 Hypo-osmolality and hyponatremia: Secondary | ICD-10-CM | POA: Diagnosis not present

## 2019-12-19 DIAGNOSIS — E46 Unspecified protein-calorie malnutrition: Secondary | ICD-10-CM | POA: Diagnosis not present

## 2019-12-19 DIAGNOSIS — I739 Peripheral vascular disease, unspecified: Secondary | ICD-10-CM | POA: Diagnosis not present

## 2019-12-19 DIAGNOSIS — R600 Localized edema: Secondary | ICD-10-CM | POA: Diagnosis not present

## 2019-12-19 DIAGNOSIS — M159 Polyosteoarthritis, unspecified: Secondary | ICD-10-CM | POA: Diagnosis not present

## 2019-12-20 DIAGNOSIS — Z89611 Acquired absence of right leg above knee: Secondary | ICD-10-CM | POA: Diagnosis not present

## 2019-12-20 DIAGNOSIS — E46 Unspecified protein-calorie malnutrition: Secondary | ICD-10-CM | POA: Diagnosis not present

## 2019-12-20 DIAGNOSIS — J449 Chronic obstructive pulmonary disease, unspecified: Secondary | ICD-10-CM | POA: Diagnosis not present

## 2019-12-20 DIAGNOSIS — L0291 Cutaneous abscess, unspecified: Secondary | ICD-10-CM | POA: Diagnosis not present

## 2019-12-20 DIAGNOSIS — C140 Malignant neoplasm of pharynx, unspecified: Secondary | ICD-10-CM | POA: Diagnosis not present

## 2019-12-20 DIAGNOSIS — E871 Hypo-osmolality and hyponatremia: Secondary | ICD-10-CM | POA: Diagnosis not present

## 2019-12-20 DIAGNOSIS — G629 Polyneuropathy, unspecified: Secondary | ICD-10-CM | POA: Diagnosis not present

## 2019-12-20 DIAGNOSIS — R600 Localized edema: Secondary | ICD-10-CM | POA: Diagnosis not present

## 2019-12-20 DIAGNOSIS — M159 Polyosteoarthritis, unspecified: Secondary | ICD-10-CM | POA: Diagnosis not present

## 2019-12-21 DIAGNOSIS — J449 Chronic obstructive pulmonary disease, unspecified: Secondary | ICD-10-CM | POA: Diagnosis not present

## 2019-12-21 DIAGNOSIS — G629 Polyneuropathy, unspecified: Secondary | ICD-10-CM | POA: Diagnosis not present

## 2019-12-21 DIAGNOSIS — Z89611 Acquired absence of right leg above knee: Secondary | ICD-10-CM | POA: Diagnosis not present

## 2019-12-21 DIAGNOSIS — C140 Malignant neoplasm of pharynx, unspecified: Secondary | ICD-10-CM | POA: Diagnosis not present

## 2019-12-21 DIAGNOSIS — E46 Unspecified protein-calorie malnutrition: Secondary | ICD-10-CM | POA: Diagnosis not present

## 2019-12-21 DIAGNOSIS — R600 Localized edema: Secondary | ICD-10-CM | POA: Diagnosis not present

## 2019-12-21 DIAGNOSIS — M159 Polyosteoarthritis, unspecified: Secondary | ICD-10-CM | POA: Diagnosis not present

## 2019-12-21 DIAGNOSIS — L0291 Cutaneous abscess, unspecified: Secondary | ICD-10-CM | POA: Diagnosis not present

## 2019-12-21 DIAGNOSIS — E871 Hypo-osmolality and hyponatremia: Secondary | ICD-10-CM | POA: Diagnosis not present

## 2019-12-26 DIAGNOSIS — C329 Malignant neoplasm of larynx, unspecified: Secondary | ICD-10-CM | POA: Diagnosis not present

## 2019-12-30 DIAGNOSIS — C140 Malignant neoplasm of pharynx, unspecified: Secondary | ICD-10-CM | POA: Diagnosis not present

## 2019-12-30 DIAGNOSIS — E871 Hypo-osmolality and hyponatremia: Secondary | ICD-10-CM | POA: Diagnosis not present

## 2019-12-30 DIAGNOSIS — M159 Polyosteoarthritis, unspecified: Secondary | ICD-10-CM | POA: Diagnosis not present

## 2019-12-30 DIAGNOSIS — I739 Peripheral vascular disease, unspecified: Secondary | ICD-10-CM | POA: Diagnosis not present

## 2019-12-30 DIAGNOSIS — R600 Localized edema: Secondary | ICD-10-CM | POA: Diagnosis not present

## 2019-12-30 DIAGNOSIS — E46 Unspecified protein-calorie malnutrition: Secondary | ICD-10-CM | POA: Diagnosis not present

## 2019-12-30 DIAGNOSIS — J449 Chronic obstructive pulmonary disease, unspecified: Secondary | ICD-10-CM | POA: Diagnosis not present

## 2019-12-30 DIAGNOSIS — I10 Essential (primary) hypertension: Secondary | ICD-10-CM | POA: Diagnosis not present

## 2019-12-30 DIAGNOSIS — Z89611 Acquired absence of right leg above knee: Secondary | ICD-10-CM | POA: Diagnosis not present

## 2020-01-09 DIAGNOSIS — E46 Unspecified protein-calorie malnutrition: Secondary | ICD-10-CM | POA: Diagnosis not present

## 2020-01-09 DIAGNOSIS — J449 Chronic obstructive pulmonary disease, unspecified: Secondary | ICD-10-CM | POA: Diagnosis not present

## 2020-01-09 DIAGNOSIS — I10 Essential (primary) hypertension: Secondary | ICD-10-CM | POA: Diagnosis not present

## 2020-01-09 DIAGNOSIS — Z89611 Acquired absence of right leg above knee: Secondary | ICD-10-CM | POA: Diagnosis not present

## 2020-01-09 DIAGNOSIS — E871 Hypo-osmolality and hyponatremia: Secondary | ICD-10-CM | POA: Diagnosis not present

## 2020-01-09 DIAGNOSIS — I739 Peripheral vascular disease, unspecified: Secondary | ICD-10-CM | POA: Diagnosis not present

## 2020-01-09 DIAGNOSIS — C140 Malignant neoplasm of pharynx, unspecified: Secondary | ICD-10-CM | POA: Diagnosis not present

## 2020-01-09 DIAGNOSIS — L0291 Cutaneous abscess, unspecified: Secondary | ICD-10-CM | POA: Diagnosis not present

## 2020-01-09 DIAGNOSIS — R600 Localized edema: Secondary | ICD-10-CM | POA: Diagnosis not present

## 2020-01-12 DIAGNOSIS — L0291 Cutaneous abscess, unspecified: Secondary | ICD-10-CM | POA: Diagnosis not present

## 2020-01-19 DIAGNOSIS — I739 Peripheral vascular disease, unspecified: Secondary | ICD-10-CM | POA: Diagnosis not present

## 2020-01-19 DIAGNOSIS — C140 Malignant neoplasm of pharynx, unspecified: Secondary | ICD-10-CM | POA: Diagnosis not present

## 2020-01-19 DIAGNOSIS — Z23 Encounter for immunization: Secondary | ICD-10-CM | POA: Diagnosis not present

## 2020-01-19 DIAGNOSIS — L0291 Cutaneous abscess, unspecified: Secondary | ICD-10-CM | POA: Diagnosis not present

## 2020-01-19 DIAGNOSIS — I10 Essential (primary) hypertension: Secondary | ICD-10-CM | POA: Diagnosis not present

## 2020-01-19 DIAGNOSIS — J449 Chronic obstructive pulmonary disease, unspecified: Secondary | ICD-10-CM | POA: Diagnosis not present

## 2020-01-19 DIAGNOSIS — B359 Dermatophytosis, unspecified: Secondary | ICD-10-CM | POA: Diagnosis not present

## 2020-01-19 DIAGNOSIS — Z89611 Acquired absence of right leg above knee: Secondary | ICD-10-CM | POA: Diagnosis not present

## 2020-01-19 DIAGNOSIS — E871 Hypo-osmolality and hyponatremia: Secondary | ICD-10-CM | POA: Diagnosis not present

## 2020-02-01 DIAGNOSIS — I739 Peripheral vascular disease, unspecified: Secondary | ICD-10-CM | POA: Diagnosis not present

## 2020-02-01 DIAGNOSIS — Z139 Encounter for screening, unspecified: Secondary | ICD-10-CM | POA: Diagnosis not present

## 2020-02-01 DIAGNOSIS — I10 Essential (primary) hypertension: Secondary | ICD-10-CM | POA: Diagnosis not present

## 2020-02-01 DIAGNOSIS — Z89611 Acquired absence of right leg above knee: Secondary | ICD-10-CM | POA: Diagnosis not present

## 2020-02-01 DIAGNOSIS — E46 Unspecified protein-calorie malnutrition: Secondary | ICD-10-CM | POA: Diagnosis not present

## 2020-02-01 DIAGNOSIS — B359 Dermatophytosis, unspecified: Secondary | ICD-10-CM | POA: Diagnosis not present

## 2020-02-01 DIAGNOSIS — E871 Hypo-osmolality and hyponatremia: Secondary | ICD-10-CM | POA: Diagnosis not present

## 2020-02-01 DIAGNOSIS — J449 Chronic obstructive pulmonary disease, unspecified: Secondary | ICD-10-CM | POA: Diagnosis not present

## 2020-02-01 DIAGNOSIS — C140 Malignant neoplasm of pharynx, unspecified: Secondary | ICD-10-CM | POA: Diagnosis not present

## 2020-04-03 DIAGNOSIS — J449 Chronic obstructive pulmonary disease, unspecified: Secondary | ICD-10-CM | POA: Diagnosis not present

## 2020-04-03 DIAGNOSIS — E871 Hypo-osmolality and hyponatremia: Secondary | ICD-10-CM | POA: Diagnosis not present

## 2020-04-03 DIAGNOSIS — I10 Essential (primary) hypertension: Secondary | ICD-10-CM | POA: Diagnosis not present

## 2020-04-03 DIAGNOSIS — Z89611 Acquired absence of right leg above knee: Secondary | ICD-10-CM | POA: Diagnosis not present

## 2020-04-03 DIAGNOSIS — R600 Localized edema: Secondary | ICD-10-CM | POA: Diagnosis not present

## 2020-04-03 DIAGNOSIS — E7800 Pure hypercholesterolemia, unspecified: Secondary | ICD-10-CM | POA: Diagnosis not present

## 2020-04-03 DIAGNOSIS — C140 Malignant neoplasm of pharynx, unspecified: Secondary | ICD-10-CM | POA: Diagnosis not present

## 2020-04-03 DIAGNOSIS — I739 Peripheral vascular disease, unspecified: Secondary | ICD-10-CM | POA: Diagnosis not present

## 2020-04-03 DIAGNOSIS — E46 Unspecified protein-calorie malnutrition: Secondary | ICD-10-CM | POA: Diagnosis not present

## 2020-04-03 DIAGNOSIS — B359 Dermatophytosis, unspecified: Secondary | ICD-10-CM | POA: Diagnosis not present

## 2020-07-03 DIAGNOSIS — I10 Essential (primary) hypertension: Secondary | ICD-10-CM | POA: Diagnosis not present

## 2020-07-03 DIAGNOSIS — Z89611 Acquired absence of right leg above knee: Secondary | ICD-10-CM | POA: Diagnosis not present

## 2020-07-03 DIAGNOSIS — C140 Malignant neoplasm of pharynx, unspecified: Secondary | ICD-10-CM | POA: Diagnosis not present

## 2020-07-03 DIAGNOSIS — E7800 Pure hypercholesterolemia, unspecified: Secondary | ICD-10-CM | POA: Diagnosis not present

## 2020-07-03 DIAGNOSIS — I739 Peripheral vascular disease, unspecified: Secondary | ICD-10-CM | POA: Diagnosis not present

## 2020-07-03 DIAGNOSIS — B359 Dermatophytosis, unspecified: Secondary | ICD-10-CM | POA: Diagnosis not present

## 2020-07-03 DIAGNOSIS — J449 Chronic obstructive pulmonary disease, unspecified: Secondary | ICD-10-CM | POA: Diagnosis not present

## 2020-07-03 DIAGNOSIS — E46 Unspecified protein-calorie malnutrition: Secondary | ICD-10-CM | POA: Diagnosis not present

## 2020-07-03 DIAGNOSIS — E871 Hypo-osmolality and hyponatremia: Secondary | ICD-10-CM | POA: Diagnosis not present

## 2020-08-02 DIAGNOSIS — I10 Essential (primary) hypertension: Secondary | ICD-10-CM | POA: Diagnosis not present

## 2020-08-02 DIAGNOSIS — Z89611 Acquired absence of right leg above knee: Secondary | ICD-10-CM | POA: Diagnosis not present

## 2020-08-02 DIAGNOSIS — E7800 Pure hypercholesterolemia, unspecified: Secondary | ICD-10-CM | POA: Diagnosis not present

## 2020-08-02 DIAGNOSIS — C140 Malignant neoplasm of pharynx, unspecified: Secondary | ICD-10-CM | POA: Diagnosis not present

## 2020-08-02 DIAGNOSIS — E871 Hypo-osmolality and hyponatremia: Secondary | ICD-10-CM | POA: Diagnosis not present

## 2020-08-02 DIAGNOSIS — I739 Peripheral vascular disease, unspecified: Secondary | ICD-10-CM | POA: Diagnosis not present

## 2020-08-02 DIAGNOSIS — B359 Dermatophytosis, unspecified: Secondary | ICD-10-CM | POA: Diagnosis not present

## 2020-08-02 DIAGNOSIS — E46 Unspecified protein-calorie malnutrition: Secondary | ICD-10-CM | POA: Diagnosis not present

## 2020-08-02 DIAGNOSIS — J449 Chronic obstructive pulmonary disease, unspecified: Secondary | ICD-10-CM | POA: Diagnosis not present

## 2020-08-14 ENCOUNTER — Inpatient Hospital Stay (HOSPITAL_COMMUNITY): Payer: Medicare PPO | Admitting: Anesthesiology

## 2020-08-14 ENCOUNTER — Encounter (HOSPITAL_COMMUNITY): Payer: Self-pay

## 2020-08-14 ENCOUNTER — Inpatient Hospital Stay (HOSPITAL_COMMUNITY): Payer: Medicare PPO

## 2020-08-14 ENCOUNTER — Inpatient Hospital Stay (HOSPITAL_COMMUNITY)
Admission: EM | Admit: 2020-08-14 | Discharge: 2020-08-19 | DRG: 252 | Disposition: A | Discharge status: Home / Self Care | Payer: Medicare PPO | Attending: Internal Medicine | Admitting: Internal Medicine

## 2020-08-14 ENCOUNTER — Other Ambulatory Visit: Payer: Self-pay

## 2020-08-14 ENCOUNTER — Encounter (HOSPITAL_COMMUNITY)
Admission: EM | Disposition: A | Discharge status: Home / Self Care | Payer: Self-pay | Source: Home / Self Care | Attending: Internal Medicine

## 2020-08-14 ENCOUNTER — Emergency Department (HOSPITAL_COMMUNITY): Payer: Medicare PPO

## 2020-08-14 DIAGNOSIS — I729 Aneurysm of unspecified site: Secondary | ICD-10-CM | POA: Diagnosis present

## 2020-08-14 DIAGNOSIS — I2510 Atherosclerotic heart disease of native coronary artery without angina pectoris: Secondary | ICD-10-CM | POA: Diagnosis not present

## 2020-08-14 DIAGNOSIS — Z452 Encounter for adjustment and management of vascular access device: Secondary | ICD-10-CM | POA: Diagnosis not present

## 2020-08-14 DIAGNOSIS — A419 Sepsis, unspecified organism: Secondary | ICD-10-CM | POA: Diagnosis not present

## 2020-08-14 DIAGNOSIS — I70302 Unspecified atherosclerosis of unspecified type of bypass graft(s) of the extremities, left leg: Secondary | ICD-10-CM | POA: Diagnosis present

## 2020-08-14 DIAGNOSIS — Z79899 Other long term (current) drug therapy: Secondary | ICD-10-CM

## 2020-08-14 DIAGNOSIS — I739 Peripheral vascular disease, unspecified: Secondary | ICD-10-CM | POA: Diagnosis not present

## 2020-08-14 DIAGNOSIS — R579 Shock, unspecified: Secondary | ICD-10-CM

## 2020-08-14 DIAGNOSIS — Z20822 Contact with and (suspected) exposure to covid-19: Secondary | ICD-10-CM | POA: Diagnosis present

## 2020-08-14 DIAGNOSIS — R64 Cachexia: Secondary | ICD-10-CM | POA: Diagnosis present

## 2020-08-14 DIAGNOSIS — E162 Hypoglycemia, unspecified: Secondary | ICD-10-CM | POA: Diagnosis present

## 2020-08-14 DIAGNOSIS — J690 Pneumonitis due to inhalation of food and vomit: Secondary | ICD-10-CM | POA: Diagnosis present

## 2020-08-14 DIAGNOSIS — Z801 Family history of malignant neoplasm of trachea, bronchus and lung: Secondary | ICD-10-CM | POA: Diagnosis not present

## 2020-08-14 DIAGNOSIS — I959 Hypotension, unspecified: Secondary | ICD-10-CM | POA: Diagnosis not present

## 2020-08-14 DIAGNOSIS — R9431 Abnormal electrocardiogram [ECG] [EKG]: Secondary | ICD-10-CM | POA: Diagnosis not present

## 2020-08-14 DIAGNOSIS — Z89611 Acquired absence of right leg above knee: Secondary | ICD-10-CM

## 2020-08-14 DIAGNOSIS — E876 Hypokalemia: Secondary | ICD-10-CM | POA: Diagnosis present

## 2020-08-14 DIAGNOSIS — I724 Aneurysm of artery of lower extremity: Principal | ICD-10-CM | POA: Diagnosis present

## 2020-08-14 DIAGNOSIS — T8110XA Postprocedural shock unspecified, initial encounter: Secondary | ICD-10-CM | POA: Diagnosis not present

## 2020-08-14 DIAGNOSIS — T8132XA Disruption of internal operation (surgical) wound, not elsewhere classified, initial encounter: Secondary | ICD-10-CM | POA: Diagnosis present

## 2020-08-14 DIAGNOSIS — R578 Other shock: Secondary | ICD-10-CM | POA: Diagnosis not present

## 2020-08-14 DIAGNOSIS — R000 Tachycardia, unspecified: Secondary | ICD-10-CM | POA: Diagnosis not present

## 2020-08-14 DIAGNOSIS — F17210 Nicotine dependence, cigarettes, uncomplicated: Secondary | ICD-10-CM | POA: Diagnosis present

## 2020-08-14 DIAGNOSIS — I9581 Postprocedural hypotension: Secondary | ICD-10-CM | POA: Diagnosis not present

## 2020-08-14 DIAGNOSIS — I700 Atherosclerosis of aorta: Secondary | ICD-10-CM | POA: Diagnosis not present

## 2020-08-14 DIAGNOSIS — I745 Embolism and thrombosis of iliac artery: Secondary | ICD-10-CM | POA: Diagnosis present

## 2020-08-14 DIAGNOSIS — R58 Hemorrhage, not elsewhere classified: Secondary | ICD-10-CM | POA: Diagnosis not present

## 2020-08-14 DIAGNOSIS — D62 Acute posthemorrhagic anemia: Secondary | ICD-10-CM | POA: Diagnosis not present

## 2020-08-14 DIAGNOSIS — E7800 Pure hypercholesterolemia, unspecified: Secondary | ICD-10-CM | POA: Diagnosis not present

## 2020-08-14 DIAGNOSIS — R1032 Left lower quadrant pain: Secondary | ICD-10-CM | POA: Diagnosis present

## 2020-08-14 DIAGNOSIS — Y832 Surgical operation with anastomosis, bypass or graft as the cause of abnormal reaction of the patient, or of later complication, without mention of misadventure at the time of the procedure: Secondary | ICD-10-CM | POA: Diagnosis present

## 2020-08-14 DIAGNOSIS — F17290 Nicotine dependence, other tobacco product, uncomplicated: Secondary | ICD-10-CM | POA: Diagnosis present

## 2020-08-14 DIAGNOSIS — Z85818 Personal history of malignant neoplasm of other sites of lip, oral cavity, and pharynx: Secondary | ICD-10-CM

## 2020-08-14 DIAGNOSIS — R739 Hyperglycemia, unspecified: Secondary | ICD-10-CM | POA: Diagnosis present

## 2020-08-14 DIAGNOSIS — E43 Unspecified severe protein-calorie malnutrition: Secondary | ICD-10-CM | POA: Diagnosis not present

## 2020-08-14 DIAGNOSIS — Z85118 Personal history of other malignant neoplasm of bronchus and lung: Secondary | ICD-10-CM

## 2020-08-14 DIAGNOSIS — R0602 Shortness of breath: Secondary | ICD-10-CM | POA: Diagnosis not present

## 2020-08-14 DIAGNOSIS — Z923 Personal history of irradiation: Secondary | ICD-10-CM

## 2020-08-14 DIAGNOSIS — Z681 Body mass index (BMI) 19 or less, adult: Secondary | ICD-10-CM

## 2020-08-14 DIAGNOSIS — R6521 Severe sepsis with septic shock: Secondary | ICD-10-CM | POA: Diagnosis present

## 2020-08-14 DIAGNOSIS — I10 Essential (primary) hypertension: Secondary | ICD-10-CM | POA: Diagnosis present

## 2020-08-14 DIAGNOSIS — E785 Hyperlipidemia, unspecified: Secondary | ICD-10-CM | POA: Diagnosis present

## 2020-08-14 DIAGNOSIS — Z7982 Long term (current) use of aspirin: Secondary | ICD-10-CM

## 2020-08-14 DIAGNOSIS — R0902 Hypoxemia: Secondary | ICD-10-CM | POA: Diagnosis not present

## 2020-08-14 DIAGNOSIS — R109 Unspecified abdominal pain: Secondary | ICD-10-CM | POA: Diagnosis not present

## 2020-08-14 DIAGNOSIS — J90 Pleural effusion, not elsewhere classified: Secondary | ICD-10-CM | POA: Diagnosis not present

## 2020-08-14 DIAGNOSIS — R918 Other nonspecific abnormal finding of lung field: Secondary | ICD-10-CM | POA: Diagnosis not present

## 2020-08-14 DIAGNOSIS — I728 Aneurysm of other specified arteries: Secondary | ICD-10-CM | POA: Diagnosis not present

## 2020-08-14 DIAGNOSIS — J9601 Acute respiratory failure with hypoxia: Secondary | ICD-10-CM | POA: Diagnosis not present

## 2020-08-14 DIAGNOSIS — Z09 Encounter for follow-up examination after completed treatment for conditions other than malignant neoplasm: Secondary | ICD-10-CM

## 2020-08-14 HISTORY — PX: FEMORAL ARTERY EXPLORATION: SHX5160

## 2020-08-14 LAB — POCT I-STAT 7, (LYTES, BLD GAS, ICA,H+H)
Acid-base deficit: 12 mmol/L — ABNORMAL HIGH (ref 0.0–2.0)
Acid-base deficit: 11 mmol/L — ABNORMAL HIGH (ref 0.0–2.0)
Acid-base deficit: 6 mmol/L — ABNORMAL HIGH (ref 0.0–2.0)
Bicarbonate: 16.2 mmol/L — ABNORMAL LOW (ref 20.0–28.0)
Bicarbonate: 17.5 mmol/L — ABNORMAL LOW (ref 20.0–28.0)
Bicarbonate: 20.3 mmol/L (ref 20.0–28.0)
Calcium, Ion: 1.79 mmol/L (ref 1.15–1.40)
Calcium, Ion: 1.14 mmol/L — ABNORMAL LOW (ref 1.15–1.40)
Calcium, Ion: 1.22 mmol/L (ref 1.15–1.40)
HCT: 21 % — ABNORMAL LOW (ref 36.0–46.0)
HCT: 35 % — ABNORMAL LOW (ref 36.0–46.0)
HCT: 31 % — ABNORMAL LOW (ref 36.0–46.0)
Hemoglobin: 7.1 g/dL — ABNORMAL LOW (ref 12.0–15.0)
Hemoglobin: 11.9 g/dL — ABNORMAL LOW (ref 12.0–15.0)
Hemoglobin: 10.5 g/dL — ABNORMAL LOW (ref 12.0–15.0)
O2 Saturation: 85 %
O2 Saturation: 79 %
O2 Saturation: 97 %
Patient temperature: 35
Patient temperature: 34
Patient temperature: 35.4
Potassium: 3.2 mmol/L — ABNORMAL LOW (ref 3.5–5.1)
Potassium: 4.5 mmol/L (ref 3.5–5.1)
Potassium: 3.5 mmol/L (ref 3.5–5.1)
Sodium: 136 mmol/L (ref 135–145)
Sodium: 137 mmol/L (ref 135–145)
Sodium: 139 mmol/L (ref 135–145)
TCO2: 18 mmol/L — ABNORMAL LOW (ref 22–32)
TCO2: 19 mmol/L — ABNORMAL LOW (ref 22–32)
TCO2: 22 mmol/L (ref 22–32)
pCO2 arterial: 45.3 mmHg (ref 32.0–48.0)
pCO2 arterial: 42.8 mmHg (ref 32.0–48.0)
pCO2 arterial: 38.3 mmHg (ref 32.0–48.0)
pH, Arterial: 7.151 — CL (ref 7.350–7.450)
pH, Arterial: 7.203 — ABNORMAL LOW (ref 7.350–7.450)
pH, Arterial: 7.325 — ABNORMAL LOW (ref 7.350–7.450)
pO2, Arterial: 57 mmHg — ABNORMAL LOW (ref 83.0–108.0)
pO2, Arterial: 45 mmHg — ABNORMAL LOW (ref 83.0–108.0)
pO2, Arterial: 95 mmHg (ref 83.0–108.0)

## 2020-08-14 LAB — BASIC METABOLIC PANEL
Anion gap: 16 — ABNORMAL HIGH (ref 5–15)
Anion gap: 18 — ABNORMAL HIGH (ref 5–15)
BUN: 8 mg/dL (ref 8–23)
BUN: 6 mg/dL — ABNORMAL LOW (ref 8–23)
CO2: 16 mmol/L — ABNORMAL LOW (ref 22–32)
CO2: 19 mmol/L — ABNORMAL LOW (ref 22–32)
Calcium: 7.6 mg/dL — ABNORMAL LOW (ref 8.9–10.3)
Calcium: 8.3 mg/dL — ABNORMAL LOW (ref 8.9–10.3)
Chloride: 101 mmol/L (ref 98–111)
Chloride: 99 mmol/L (ref 98–111)
Creatinine, Ser: 0.87 mg/dL (ref 0.44–1.00)
Creatinine, Ser: 0.68 mg/dL (ref 0.44–1.00)
GFR, Estimated: >60 mL/min (ref >60)
GFR, Estimated: >60 mL/min (ref >60)
Glucose, Bld: 226 mg/dL — ABNORMAL HIGH (ref 70–99)
Glucose, Bld: 192 mg/dL — ABNORMAL HIGH (ref 70–99)
Potassium: 3.4 mmol/L — ABNORMAL LOW (ref 3.5–5.1)
Potassium: 3.3 mmol/L — ABNORMAL LOW (ref 3.5–5.1)
Sodium: 133 mmol/L — ABNORMAL LOW (ref 135–145)
Sodium: 136 mmol/L (ref 135–145)

## 2020-08-14 LAB — DIC (DISSEMINATED INTRAVASCULAR COAGULATION)PANEL
D-Dimer, Quant: 5.62 ug/mL-FEU — ABNORMAL HIGH (ref 0.00–0.50)
Fibrinogen: 163 mg/dL — ABNORMAL LOW (ref 210–475)
INR: 1.4 — ABNORMAL HIGH (ref 0.8–1.2)
Platelets: 144 10*3/uL — ABNORMAL LOW (ref 150–400)
Prothrombin Time: 17.5 seconds — ABNORMAL HIGH (ref 11.4–15.2)
Smear Review: NONE SEEN
aPTT: 33 seconds (ref 24–36)

## 2020-08-14 LAB — CBC WITH DIFFERENTIAL/PLATELET
Abs Immature Granulocytes: 0.19 10*3/uL — ABNORMAL HIGH (ref 0.00–0.07)
Basophils Absolute: 0.1 10*3/uL (ref 0.0–0.1)
Basophils Relative: 1 %
Eosinophils Absolute: 0 10*3/uL (ref 0.0–0.5)
Eosinophils Relative: 0 %
HCT: 30.9 % — ABNORMAL LOW (ref 36.0–46.0)
Hemoglobin: 9.6 g/dL — ABNORMAL LOW (ref 12.0–15.0)
Immature Granulocytes: 1 %
Lymphocytes Relative: 6 %
Lymphs Abs: 1 10*3/uL (ref 0.7–4.0)
MCH: 28.3 pg (ref 26.0–34.0)
MCHC: 31.1 g/dL (ref 30.0–36.0)
MCV: 91.2 fL (ref 80.0–100.0)
Monocytes Absolute: 1.1 10*3/uL — ABNORMAL HIGH (ref 0.1–1.0)
Monocytes Relative: 6 %
Neutro Abs: 14.7 10*3/uL — ABNORMAL HIGH (ref 1.7–7.7)
Neutrophils Relative %: 86 %
Platelets: 232 10*3/uL (ref 150–400)
RBC: 3.39 MIL/uL — ABNORMAL LOW (ref 3.87–5.11)
RDW: 16 % — ABNORMAL HIGH (ref 11.5–15.5)
WBC: 17.1 10*3/uL — ABNORMAL HIGH (ref 4.0–10.5)
nRBC: 0 % (ref 0.0–0.2)

## 2020-08-14 LAB — CBC
HCT: 33.8 % — ABNORMAL LOW (ref 36.0–46.0)
Hemoglobin: 11.2 g/dL — ABNORMAL LOW (ref 12.0–15.0)
MCH: 29.4 pg (ref 26.0–34.0)
MCHC: 33.1 g/dL (ref 30.0–36.0)
MCV: 88.7 fL (ref 80.0–100.0)
Platelets: 138 10*3/uL — ABNORMAL LOW (ref 150–400)
RBC: 3.81 MIL/uL — ABNORMAL LOW (ref 3.87–5.11)
RDW: 14.7 % (ref 11.5–15.5)
WBC: 11.7 10*3/uL — ABNORMAL HIGH (ref 4.0–10.5)
nRBC: 0 % (ref 0.0–0.2)

## 2020-08-14 LAB — TYPE AND SCREEN: Donor AG Type: NEGATIVE

## 2020-08-14 LAB — I-STAT CHEM 8, ED
BUN: 8 mg/dL (ref 8–23)
Calcium, Ion: 1.02 mmol/L — ABNORMAL LOW (ref 1.15–1.40)
Chloride: 102 mmol/L (ref 98–111)
Creatinine, Ser: 0.6 mg/dL (ref 0.44–1.00)
Glucose, Bld: 225 mg/dL — ABNORMAL HIGH (ref 70–99)
HCT: 31 % — ABNORMAL LOW (ref 36.0–46.0)
Hemoglobin: 10.5 g/dL — ABNORMAL LOW (ref 12.0–15.0)
Potassium: 3.4 mmol/L — ABNORMAL LOW (ref 3.5–5.1)
Sodium: 134 mmol/L — ABNORMAL LOW (ref 135–145)
TCO2: 18 mmol/L — ABNORMAL LOW (ref 22–32)

## 2020-08-14 LAB — PROTIME-INR
INR: 1.1 (ref 0.8–1.2)
Prothrombin Time: 14.2 seconds (ref 11.4–15.2)

## 2020-08-14 LAB — MRSA PCR SCREENING: MRSA by PCR: NEGATIVE

## 2020-08-14 LAB — GLUCOSE, CAPILLARY: Glucose-Capillary: 173 mg/dL — ABNORMAL HIGH (ref 70–99)

## 2020-08-14 LAB — MAGNESIUM: Magnesium: 1.3 mg/dL — ABNORMAL LOW (ref 1.7–2.4)

## 2020-08-14 LAB — PHOSPHORUS: Phosphorus: 5 mg/dL — ABNORMAL HIGH (ref 2.5–4.6)

## 2020-08-14 SURGERY — EXPLORATION, ARTERY, FEMORAL
Anesthesia: General | Laterality: Left

## 2020-08-14 MED ORDER — FENTANYL CITRATE (PF) 250 MCG/5ML IJ SOLN
INTRAMUSCULAR | Status: DC | PRN
  Administered 2020-08-14: 25 ug via INTRAVENOUS
  Administered 2020-08-14: 50 ug via INTRAVENOUS

## 2020-08-14 MED ORDER — ALBUMIN HUMAN 5 % IV SOLN: INTRAVENOUS | Status: DC | PRN

## 2020-08-14 MED ORDER — VASOPRESSIN 20 UNIT/ML IV SOLN
INTRAVENOUS | Status: DC | PRN
  Administered 2020-08-14: 1 [IU] via INTRAVENOUS
  Administered 2020-08-14 (×3): 2 [IU] via INTRAVENOUS
  Administered 2020-08-14: 3 [IU] via INTRAVENOUS
  Administered 2020-08-14 (×5): 2 [IU] via INTRAVENOUS

## 2020-08-14 MED ORDER — 0.9 % SODIUM CHLORIDE (POUR BTL) OPTIME
TOPICAL | Status: DC | PRN
  Administered 2020-08-14: 2000 mL

## 2020-08-14 MED ORDER — SODIUM BICARBONATE 8.4 % IV SOLN
INTRAVENOUS | Status: AC
  Filled 2020-08-14: qty 50

## 2020-08-14 MED ORDER — PANTOPRAZOLE SODIUM 40 MG PO TBEC: 40.0000 mg | DELAYED_RELEASE_TABLET | Freq: Every day | ORAL | Status: DC

## 2020-08-14 MED ORDER — DEXMEDETOMIDINE HCL IN NACL 400 MCG/100ML IV SOLN
0.0000 ug/kg/h | INTRAVENOUS | Status: DC
  Administered 2020-08-14: 0.2 ug/kg/h via INTRAVENOUS
  Filled 2020-08-14: qty 100

## 2020-08-14 MED ORDER — EPINEPHRINE HCL 5 MG/250ML IV SOLN IN NS
0.5000 ug/min | INTRAVENOUS | Status: AC
  Administered 2020-08-14: 1 ug/min via INTRAVENOUS
  Filled 2020-08-14: qty 250

## 2020-08-14 MED ORDER — HYDRALAZINE HCL 25 MG PO TABS: 25.0000 mg | ORAL_TABLET | Freq: Four times a day (QID) | ORAL | Status: DC | PRN

## 2020-08-14 MED ORDER — ONDANSETRON HCL 4 MG/2ML IJ SOLN
INTRAMUSCULAR | Status: DC | PRN
  Administered 2020-08-14: 4 mg via INTRAVENOUS

## 2020-08-14 MED ORDER — POTASSIUM CHLORIDE 10 MEQ/50ML IV SOLN
INTRAVENOUS | Status: AC
  Filled 2020-08-14: qty 150

## 2020-08-14 MED ORDER — POTASSIUM CHLORIDE 10 MEQ/100ML IV SOLN: 10.0000 meq | INTRAVENOUS | Status: DC

## 2020-08-14 MED ORDER — FENTANYL 2500MCG IN NS 250ML (10MCG/ML) PREMIX INFUSION: 0.0000 ug/h | INTRAVENOUS | Status: DC

## 2020-08-14 MED ORDER — PHENYLEPHRINE HCL (PRESSORS) 10 MG/ML IV SOLN
INTRAVENOUS | Status: AC
  Filled 2020-08-14: qty 1

## 2020-08-14 MED ORDER — SODIUM BICARBONATE 8.4 % IV SOLN
INTRAVENOUS | Status: DC | PRN
  Administered 2020-08-14 (×2): 50 mL via INTRAVENOUS

## 2020-08-14 MED ORDER — GABAPENTIN 400 MG PO CAPS
400.0000 mg | ORAL_CAPSULE | Freq: Three times a day (TID) | ORAL | Status: DC
  Administered 2020-08-16 – 2020-08-19 (×10): 400 mg via ORAL
  Filled 2020-08-14 (×11): qty 1

## 2020-08-14 MED ORDER — VASOPRESSIN 20 UNITS/100 ML INFUSION FOR SHOCK
0.0000 [IU]/min | INTRAVENOUS | Status: DC
  Administered 2020-08-15: 0.02 [IU]/min via INTRAVENOUS
  Filled 2020-08-14: qty 100

## 2020-08-14 MED ORDER — CALCIUM CHLORIDE 10 % IV SOLN
INTRAVENOUS | Status: DC | PRN
  Administered 2020-08-14: 1 g via INTRAVENOUS

## 2020-08-14 MED ORDER — SODIUM CHLORIDE 0.9 % IV SOLN
INTRAVENOUS | Status: DC | PRN
  Administered 2020-08-14: 500 mL via INTRAVENOUS

## 2020-08-14 MED ORDER — EPHEDRINE 5 MG/ML INJ
INTRAVENOUS | Status: AC
  Filled 2020-08-14: qty 10

## 2020-08-14 MED ORDER — OXYCODONE HCL 5 MG PO TABS: 5.0000 mg | ORAL_TABLET | ORAL | Status: DC | PRN

## 2020-08-14 MED ORDER — ASPIRIN EC 81 MG PO TBEC
81.0000 mg | DELAYED_RELEASE_TABLET | Freq: Every day | ORAL | Status: DC
  Filled 2020-08-14: qty 1

## 2020-08-14 MED ORDER — VANCOMYCIN HCL 750 MG/150ML IV SOLN
750.0000 mg | INTRAVENOUS | Status: DC
  Administered 2020-08-14 – 2020-08-16 (×2): 750 mg via INTRAVENOUS
  Filled 2020-08-14 (×2): qty 150

## 2020-08-14 MED ORDER — LACTATED RINGERS IV SOLN: INTRAVENOUS | Status: DC

## 2020-08-14 MED ORDER — FENTANYL CITRATE (PF) 100 MCG/2ML IJ SOLN: 25.0000 ug | Freq: Once | INTRAMUSCULAR | Status: DC

## 2020-08-14 MED ORDER — ORAL CARE MOUTH RINSE
15.0000 mL | OROMUCOSAL | Status: DC
  Administered 2020-08-14 – 2020-08-15 (×5): 15 mL via OROMUCOSAL

## 2020-08-14 MED ORDER — CEFAZOLIN SODIUM-DEXTROSE 1-4 GM/50ML-% IV SOLN
INTRAVENOUS | Status: DC | PRN
  Administered 2020-08-14: 1 g via INTRAVENOUS

## 2020-08-14 MED ORDER — EPINEPHRINE HCL 5 MG/250ML IV SOLN IN NS: 0.5000 ug/min | INTRAVENOUS | Status: DC

## 2020-08-14 MED ORDER — DEXAMETHASONE SODIUM PHOSPHATE 10 MG/ML IJ SOLN
INTRAMUSCULAR | Status: DC | PRN
  Administered 2020-08-14: 5 mg via INTRAVENOUS

## 2020-08-14 MED ORDER — PHENYLEPHRINE 40 MCG/ML (10ML) SYRINGE FOR IV PUSH (FOR BLOOD PRESSURE SUPPORT)
PREFILLED_SYRINGE | INTRAVENOUS | Status: DC | PRN
  Administered 2020-08-14: 120 ug via INTRAVENOUS
  Administered 2020-08-14: 160 ug via INTRAVENOUS
  Administered 2020-08-14: 120 ug via INTRAVENOUS

## 2020-08-14 MED ORDER — EPINEPHRINE 1 MG/10ML IJ SOSY
PREFILLED_SYRINGE | INTRAMUSCULAR | Status: DC | PRN
  Administered 2020-08-14 (×4): .05 mg via INTRAVENOUS

## 2020-08-14 MED ORDER — SODIUM CHLORIDE 0.9 % IV SOLN
INTRAVENOUS | Status: AC
  Filled 2020-08-14: qty 1.2

## 2020-08-14 MED ORDER — ORAL CARE MOUTH RINSE: 15.0000 mL | Freq: Once | OROMUCOSAL | Status: AC

## 2020-08-14 MED ORDER — FENTANYL CITRATE (PF) 250 MCG/5ML IJ SOLN
INTRAMUSCULAR | Status: AC
  Filled 2020-08-14: qty 5

## 2020-08-14 MED ORDER — POTASSIUM CHLORIDE 10 MEQ/50ML IV SOLN
10.0000 meq | INTRAVENOUS | Status: AC
  Administered 2020-08-14 – 2020-08-15 (×2): 10 meq via INTRAVENOUS

## 2020-08-14 MED ORDER — ENSURE ENLIVE PO LIQD
237.0000 mL | Freq: Two times a day (BID) | ORAL | Status: DC
  Administered 2020-08-15 – 2020-08-19 (×7): 237 mL via ORAL

## 2020-08-14 MED ORDER — MIDAZOLAM HCL 2 MG/2ML IJ SOLN
INTRAMUSCULAR | Status: AC
  Filled 2020-08-14: qty 2

## 2020-08-14 MED ORDER — ONDANSETRON HCL 4 MG/2ML IJ SOLN
INTRAMUSCULAR | Status: AC
  Filled 2020-08-14: qty 2

## 2020-08-14 MED ORDER — CEFAZOLIN SODIUM 1 G IJ SOLR
INTRAMUSCULAR | Status: AC
  Filled 2020-08-14: qty 10

## 2020-08-14 MED ORDER — FENTANYL 2500MCG IN NS 250ML (10MCG/ML) PREMIX INFUSION: 25.0000 ug/h | INTRAVENOUS | Status: DC

## 2020-08-14 MED ORDER — INSULIN ASPART 100 UNIT/ML IJ SOLN
0.0000 [IU] | INTRAMUSCULAR | Status: DC
  Administered 2020-08-14 – 2020-08-15 (×3): 2 [IU] via SUBCUTANEOUS

## 2020-08-14 MED ORDER — MIDAZOLAM HCL 2 MG/2ML IJ SOLN
INTRAMUSCULAR | Status: DC | PRN
  Administered 2020-08-14 (×2): 1 mg via INTRAVENOUS

## 2020-08-14 MED ORDER — NOREPINEPHRINE 4 MG/250ML-% IV SOLN
0.0000 ug/min | INTRAVENOUS | Status: AC
  Administered 2020-08-14: 4 ug/min via INTRAVENOUS
  Filled 2020-08-14 (×2): qty 250

## 2020-08-14 MED ORDER — CHLORHEXIDINE GLUCONATE 0.12% ORAL RINSE (MEDLINE KIT)
15.0000 mL | Freq: Two times a day (BID) | OROMUCOSAL | Status: DC
  Administered 2020-08-14: 15 mL via OROMUCOSAL

## 2020-08-14 MED ORDER — ROCURONIUM BROMIDE 10 MG/ML (PF) SYRINGE
PREFILLED_SYRINGE | INTRAVENOUS | Status: DC | PRN
  Administered 2020-08-14: 50 mg via INTRAVENOUS

## 2020-08-14 MED ORDER — MAGNESIUM SULFATE 2 GM/50ML IV SOLN
2.0000 g | Freq: Every day | INTRAVENOUS | Status: AC | PRN
  Administered 2020-08-14: 2 g via INTRAVENOUS
  Filled 2020-08-14: qty 50

## 2020-08-14 MED ORDER — EPINEPHRINE 1 MG/10ML IJ SOSY
PREFILLED_SYRINGE | INTRAMUSCULAR | Status: AC
  Filled 2020-08-14: qty 10

## 2020-08-14 MED ORDER — POTASSIUM CHLORIDE CRYS ER 20 MEQ PO TBCR: 40.0000 meq | EXTENDED_RELEASE_TABLET | Freq: Once | ORAL | Status: DC

## 2020-08-14 MED ORDER — DEXAMETHASONE SODIUM PHOSPHATE 10 MG/ML IJ SOLN
INTRAMUSCULAR | Status: AC
  Filled 2020-08-14: qty 1

## 2020-08-14 MED ORDER — LACTATED RINGERS IV SOLN: INTRAVENOUS | Status: DC | PRN

## 2020-08-14 MED ORDER — SODIUM CHLORIDE 0.9 % IV SOLN
500.0000 mL | Freq: Once | INTRAVENOUS | Status: AC | PRN
  Administered 2020-08-16: 500 mL via INTRAVENOUS

## 2020-08-14 MED ORDER — LIDOCAINE 2% (20 MG/ML) 5 ML SYRINGE
INTRAMUSCULAR | Status: AC
  Filled 2020-08-14: qty 10

## 2020-08-14 MED ORDER — IOHEXOL 300 MG/ML  SOLN
75.0000 mL | Freq: Once | INTRAMUSCULAR | Status: AC | PRN
  Administered 2020-08-14: 75 mL via INTRAVENOUS

## 2020-08-14 MED ORDER — LIDOCAINE 2% (20 MG/ML) 5 ML SYRINGE
INTRAMUSCULAR | Status: DC | PRN
  Administered 2020-08-14: 40 mg via INTRAVENOUS

## 2020-08-14 MED ORDER — SENNOSIDES-DOCUSATE SODIUM 8.6-50 MG PO TABS: 1.0000 | ORAL_TABLET | Freq: Every evening | ORAL | Status: DC | PRN

## 2020-08-14 MED ORDER — PROTAMINE SULFATE 10 MG/ML IV SOLN
INTRAVENOUS | Status: DC | PRN
  Administered 2020-08-14: 50 mg via INTRAVENOUS

## 2020-08-14 MED ORDER — HEPARIN SODIUM (PORCINE) 1000 UNIT/ML IJ SOLN
INTRAMUSCULAR | Status: DC | PRN
  Administered 2020-08-14: 4000 [IU] via INTRAVENOUS

## 2020-08-14 MED ORDER — ONDANSETRON HCL 4 MG/2ML IJ SOLN: 4.0000 mg | Freq: Four times a day (QID) | INTRAMUSCULAR | Status: DC | PRN

## 2020-08-14 MED ORDER — VASOPRESSIN 20 UNITS/100 ML INFUSION FOR SHOCK
0.0000 [IU]/min | INTRAVENOUS | Status: AC
Start: 2020-08-14 — End: 2020-08-14
  Administered 2020-08-14: .03 [IU]/min via INTRAVENOUS
  Filled 2020-08-14: qty 100

## 2020-08-14 MED ORDER — PROPOFOL 10 MG/ML IV BOLUS
INTRAVENOUS | Status: AC
  Filled 2020-08-14: qty 20

## 2020-08-14 MED ORDER — PROPOFOL 1000 MG/100ML IV EMUL: 5.0000 ug/kg/min | INTRAVENOUS | Status: DC

## 2020-08-14 MED ORDER — PIPERACILLIN-TAZOBACTAM 3.375 G IVPB
3.3750 g | Freq: Three times a day (TID) | INTRAVENOUS | Status: DC
  Administered 2020-08-14 – 2020-08-17 (×9): 3.375 g via INTRAVENOUS
  Filled 2020-08-14 (×9): qty 50

## 2020-08-14 MED ORDER — FENTANYL BOLUS VIA INFUSION
25.0000 ug | INTRAVENOUS | Status: DC | PRN
Start: 2020-08-14 — End: 2020-08-14

## 2020-08-14 MED ORDER — NOREPINEPHRINE 4 MG/250ML-% IV SOLN
0.0000 ug/min | INTRAVENOUS | Status: DC
  Administered 2020-08-15: 4 ug/min via INTRAVENOUS
  Filled 2020-08-14: qty 250

## 2020-08-14 MED ORDER — FENTANYL BOLUS VIA INFUSION
25.0000 ug | INTRAVENOUS | Status: DC | PRN
Start: 2020-08-14 — End: 2020-08-15
  Filled 2020-08-14: qty 100

## 2020-08-14 MED ORDER — CHLORHEXIDINE GLUCONATE 0.12 % MT SOLN: 15.0000 mL | Freq: Once | OROMUCOSAL | Status: AC

## 2020-08-14 MED ORDER — NICOTINE 21 MG/24HR TD PT24
21.0000 mg | MEDICATED_PATCH | TRANSDERMAL | Status: DC
  Administered 2020-08-14: 21 mg via TRANSDERMAL
  Filled 2020-08-14 (×2): qty 1

## 2020-08-14 MED ORDER — FENTANYL 2500MCG IN NS 250ML (10MCG/ML) PREMIX INFUSION
25.0000 ug/h | INTRAVENOUS | Status: DC
  Administered 2020-08-14: 25 ug/h via INTRAVENOUS
  Filled 2020-08-14: qty 250

## 2020-08-14 MED ORDER — PROPOFOL 10 MG/ML IV BOLUS
INTRAVENOUS | Status: DC | PRN
  Administered 2020-08-14: 70 mg via INTRAVENOUS

## 2020-08-14 MED ORDER — CHLORHEXIDINE GLUCONATE 0.12 % MT SOLN
OROMUCOSAL | Status: AC
  Administered 2020-08-14: 15 mL via OROMUCOSAL
  Filled 2020-08-14: qty 15

## 2020-08-14 MED ORDER — HYDROMORPHONE HCL 1 MG/ML IJ SOLN: 0.5000 mg | INTRAMUSCULAR | Status: DC | PRN

## 2020-08-14 MED ORDER — PANTOPRAZOLE SODIUM 40 MG IV SOLR
40.0000 mg | Freq: Every evening | INTRAVENOUS | Status: DC
  Administered 2020-08-14 – 2020-08-17 (×4): 40 mg via INTRAVENOUS
  Filled 2020-08-14 (×4): qty 40

## 2020-08-14 MED ORDER — POLYETHYLENE GLYCOL 3350 17 G PO PACK
17.0000 g | PACK | Freq: Every day | ORAL | Status: DC
  Filled 2020-08-14: qty 1

## 2020-08-14 MED ORDER — PHENYLEPHRINE 40 MCG/ML (10ML) SYRINGE FOR IV PUSH (FOR BLOOD PRESSURE SUPPORT)
PREFILLED_SYRINGE | INTRAVENOUS | Status: AC
  Filled 2020-08-14: qty 30

## 2020-08-14 MED ORDER — SODIUM CHLORIDE 0.9 % IV SOLN
INTRAVENOUS | Status: DC | PRN
  Administered 2020-08-14: 500 mL

## 2020-08-14 MED ORDER — ROCURONIUM BROMIDE 10 MG/ML (PF) SYRINGE
PREFILLED_SYRINGE | INTRAVENOUS | Status: AC
  Filled 2020-08-14: qty 40

## 2020-08-14 MED ORDER — CALCIUM CHLORIDE 10 % IV SOLN
INTRAVENOUS | Status: AC
  Filled 2020-08-14: qty 20

## 2020-08-14 MED ORDER — PHENOL 1.4 % MT LIQD: 1.0000 | OROMUCOSAL | Status: DC | PRN

## 2020-08-14 SURGICAL SUPPLY — 60 items
BANDAGE ESMARK 6X9 LF (GAUZE/BANDAGES/DRESSINGS) IMPLANT
BLADE SURG 15 STRL LF DISP TIS (BLADE) ×1 IMPLANT
BLADE SURG 15 STRL SS (BLADE) ×1
BNDG ESMARK 6X9 LF (GAUZE/BANDAGES/DRESSINGS)
CANISTER SUCT 3000ML PPV (MISCELLANEOUS) ×2 IMPLANT
CANNULA VESSEL 3MM 2 BLNT TIP (CANNULA) ×2 IMPLANT
CATH EMB 3FR 40CM (CATHETERS) ×2 IMPLANT
CATH FOLEY 2WAY SLVR  5CC 14FR (CATHETERS) ×1
CATH FOLEY 2WAY SLVR 5CC 14FR (CATHETERS) ×1 IMPLANT
CLIP LIGATING EXTRA MED SLVR (CLIP) ×2 IMPLANT
CLIP LIGATING EXTRA SM BLUE (MISCELLANEOUS) ×2 IMPLANT
COVER WAND RF STERILE (DRAPES) ×2 IMPLANT
CUFF TOURN SGL QUICK 34 (TOURNIQUET CUFF)
CUFF TOURN SGL QUICK 42 (TOURNIQUET CUFF) IMPLANT
CUFF TRNQT CYL 34X4.125X (TOURNIQUET CUFF) IMPLANT
DERMABOND ADVANCED (GAUZE/BANDAGES/DRESSINGS) ×1
DERMABOND ADVANCED .7 DNX12 (GAUZE/BANDAGES/DRESSINGS) ×1 IMPLANT
DRAIN SNY 10X20 3/4 PERF (WOUND CARE) IMPLANT
DRAPE WARM FLUID 44X44 (DRAPES) ×2 IMPLANT
DRAPE X-RAY CASS 24X20 (DRAPES) IMPLANT
DRSG COVADERM 4X8 (GAUZE/BANDAGES/DRESSINGS) ×2 IMPLANT
ELECT REM PT RETURN 9FT ADLT (ELECTROSURGICAL) ×2
ELECTRODE REM PT RTRN 9FT ADLT (ELECTROSURGICAL) ×1 IMPLANT
EVACUATOR SILICONE 100CC (DRAIN) IMPLANT
GAUZE SPONGE 4X4 12PLY STRL (GAUZE/BANDAGES/DRESSINGS) ×2 IMPLANT
GLOVE SS BIOGEL STRL SZ 7.5 (GLOVE) ×1 IMPLANT
GLOVE SUPERSENSE BIOGEL SZ 7.5 (GLOVE) ×1
GLOVE SURG MICRO LTX SZ6 (GLOVE) ×2 IMPLANT
GLOVE SURG MICRO LTX SZ7.5 (GLOVE) ×2 IMPLANT
GLOVE SURG UNDER POLY LF SZ6.5 (GLOVE) ×8 IMPLANT
GOWN STRL REUS W/ TWL LRG LVL3 (GOWN DISPOSABLE) ×6 IMPLANT
GOWN STRL REUS W/TWL LRG LVL3 (GOWN DISPOSABLE) ×6
GRAFT PROPATEN STD WALL 6X10 (Vascular Products) ×2 IMPLANT
INSERT FOGARTY SM (MISCELLANEOUS) IMPLANT
KIT BASIN OR (CUSTOM PROCEDURE TRAY) ×2 IMPLANT
KIT TURNOVER KIT B (KITS) ×2 IMPLANT
NS IRRIG 1000ML POUR BTL (IV SOLUTION) ×4 IMPLANT
PACK PERIPHERAL VASCULAR (CUSTOM PROCEDURE TRAY) ×2 IMPLANT
PAD ARMBOARD 7.5X6 YLW CONV (MISCELLANEOUS) ×4 IMPLANT
PADDING CAST COTTON 6X4 STRL (CAST SUPPLIES) IMPLANT
SET COLLECT BLD 21X3/4 12 (NEEDLE) IMPLANT
STAPLER VISISTAT 35W (STAPLE) IMPLANT
STOPCOCK 3 WAY HIGH PRESSURE (MISCELLANEOUS) ×1
STOPCOCK 3WAY HIGH PRESSURE (MISCELLANEOUS) ×1 IMPLANT
STOPCOCK 4 WAY LG BORE MALE ST (IV SETS) IMPLANT
SUT ETHILON 3 0 PS 1 (SUTURE) ×6 IMPLANT
SUT PROLENE 5 0 C 1 24 (SUTURE) ×4 IMPLANT
SUT PROLENE 6 0 CC (SUTURE) ×10 IMPLANT
SUT SILK 2 0 SH (SUTURE) ×2 IMPLANT
SUT VIC AB 2-0 CTX 36 (SUTURE) ×6 IMPLANT
SUT VIC AB 3-0 SH 27 (SUTURE) ×3
SUT VIC AB 3-0 SH 27X BRD (SUTURE) ×3 IMPLANT
SWAB COLLECTION DEVICE MRSA (MISCELLANEOUS) ×2 IMPLANT
SWAB CULTURE ESWAB REG 1ML (MISCELLANEOUS) ×2 IMPLANT
SYR 3ML LL SCALE MARK (SYRINGE) ×2 IMPLANT
TOWEL GREEN STERILE (TOWEL DISPOSABLE) ×2 IMPLANT
TRAY FOLEY MTR SLVR 16FR STAT (SET/KITS/TRAYS/PACK) IMPLANT
TUBING EXTENTION W/L.L. (IV SETS) IMPLANT
UNDERPAD 30X36 HEAVY ABSORB (UNDERPADS AND DIAPERS) ×2 IMPLANT
WATER STERILE IRR 1000ML POUR (IV SOLUTION) ×2 IMPLANT

## 2020-08-14 NOTE — Consult Note (Signed)
[facility] [place]  Patient name: Heather Jimenez MRN: [id number] DOB: Aug 06, 1945 Sex: female   REQUESTING PROVIDER:    ER   REASON FOR CONSULT:    Left groin pseudoaneurysm  HISTORY OF PRESENT ILLNESS:   Heather Jimenez is a 75 y.o. female, who presented to [facility] emergency department this morning with pain in her left groin.  EMS found the patient in her bathroom with a lot of blood on the floor.  A stitch was placed.  She was taken to the hospital where she was hypotensive.  She was transferred to [facility].  The patient initially underwent an aortobifemoral bypass graft and right femoral-popliteal bypass graft by Dr. Oneida Alar in 2010.  She went on to require a right above-knee amputation.  In 2017 she developed a pseudoaneurysm of the left groin which was a medial dehiscence of the anastomosis.  This was treated with an interposition graft.  The patient has a history of throat cancer and has undergone laryngectomy.  She is on ACE inhibitor for hypertension and an aspirin.  PAST MEDICAL HISTORY    Past Medical History:  Diagnosis Date  . Carotid bruit   . Dyslipidemia   . PAD (peripheral artery disease) ([place])   . Pneumonia   . Throat cancer ([place])   . Tobacco dependence      FAMILY HISTORY   Family History  Problem Relation Age of Onset  . Cancer Mother   . Lung cancer Brother     SOCIAL HISTORY:   Social History   Socioeconomic History  . Marital status: Married    Spouse name: Not on file  . Number of children: Not on file  . Years of education: Not on file  . Highest education level: Not on file  Occupational History  . Not on file  Tobacco Use  . Smoking status: Light Tobacco Smoker    Packs/day: 1.00    Years: 35.00    Pack years: 35.00    Types: Cigarettes  . Smokeless tobacco: Never Used  Substance and Sexual Activity  . Alcohol use: No    Alcohol/week: 0.0 standard drinks  . Drug  use: No  . Sexual activity: Not on file  Other Topics Concern  . Not on file  Social History Narrative  . Not on file   Social Determinants of Health   Financial Resource Strain: Not on file  Food Insecurity: Not on file  Transportation Needs: Not on file  Physical Activity: Not on file  Stress: Not on file  Social Connections: Not on file  Intimate Partner Violence: Not on file    ALLERGIES:    No Known Allergies  CURRENT MEDICATIONS:    No current facility-administered medications for this encounter.   Current Outpatient Medications  Medication Sig Dispense Refill  . aspirin EC 81 MG tablet Take by mouth.    . gabapentin (NEURONTIN) 400 MG capsule Take 400 mg by mouth See admin instructions. Two to three times a day  12  . KLOR-CON M20 20 MEQ tablet Take 20 mEq by mouth 2 (two) times daily.     Marland Kitchen lisinopril (PRINIVIL,ZESTRIL) 10 MG tablet TK 1 T PO D  12    REVIEW OF SYSTEMS:   [X]  denotes positive finding, [ ]  denotes negative finding Cardiac  Comments:  Chest pain or chest pressure:    Shortness of breath upon exertion:    Short of breath when lying flat:    Irregular heart  rhythm:        Vascular    Pain in calf, thigh, or hip brought on by ambulation:    Pain in feet at night that wakes you up from your sleep:     Blood clot in your veins:    Leg swelling:         Pulmonary    Oxygen at home:    Productive cough:     Wheezing:         Neurologic    Sudden weakness in arms or legs:     Sudden numbness in arms or legs:     Sudden onset of difficulty speaking or slurred speech:    Temporary loss of vision in one eye:     Problems with dizziness:         Gastrointestinal    Blood in stool:      Vomited blood:         Genitourinary    Burning when urinating:     Blood in urine:        Psychiatric    Major depression:         Hematologic    Bleeding problems:    Problems with blood clotting too easily:        Skin    Rashes or ulcers:         Constitutional    Fever or chills:     PHYSICAL EXAM:   Vitals:   08/14/20 1115 08/14/20 1120  BP: 131/82   Pulse:  93  Resp: (!) 23   Temp:  (!) 97.5 F (36.4 C)  TempSrc:  Oral  SpO2:  96%  Weight:  34.9 kg  Height:  5\' 3"  (1.6 m)    GENERAL: The patient is a well-nourished female, in no acute distress. The vital signs are documented above. CARDIAC: There is a regular rate and rhythm.  VASCULAR: Left femoral pseudoaneurysm measuring approximately 2 cm at the distal aspect of the incision.  There is no active bleeding.  No obvious erythema. PULMONARY: Nonlabored respirations ABDOMEN: Soft and non-tender  MUSCULOSKELETAL: There are no major deformities or cyanosis. NEUROLOGIC: No focal weakness or paresthesias are detected. SKIN: There are no ulcers or rashes noted. PSYCHIATRIC: The patient has a normal affect.  STUDIES:   CTA:  ASSESSMENT and PLAN   Left femoral pseudoaneurysm: I discussed with the patient that she is going to require emergent surgical exploration and repair of her pseudoaneurysm.  We discussed the possibility that this could be infected and worst-case scenario, she would require removal of her graft which would carry significant morbidity.  Given that she has already had a bleeding episode once today, we will get her to the operating room soon as possible.  While waiting, I will try to get a CT scan to define her anatomy.  She does have antibodies and so we are working on getting her typed and crossed.   Leia Alf, MD, FACS [facility] [phone] Pager [phone]

## 2020-08-14 NOTE — H&P (Signed)
History and Physical    Heather Jimenez VZD:[id number] DOB: June 30, 1945 DOA: 08/14/2020  PCP: Cher Nakai, MD (Confirm with patient/family/NH records and if not entered, this has to be entered at [facility] point of entry) Patient coming from: Home  I have personally briefly reviewed patient's old medical records in [place]  Chief Complaint: Left groin bleeding  HPI: Heather Jimenez is a 75 y.o. 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  This morning, patient went to bathroom and when standing up from toilet, suddenly started to have back 10/10 sharp pain under her left groin with bleeding.  Patient then felt lightheaded and fell on the ground.  Family found patient lying on the floor with large amount of blood around.  EMS was called, patient transferred to [facility] emergency room, patient was found hypotensive with tachycardia, exam found patient had a fresh left groin bleeding, supposedly from pseudoaneurysm bleeding.  Patient was given 1 L of IV bolus and transferred to [facility] for vascular surgery intervention.  Patient denies any chest pain, no shortness of breath, denies any loss of consciousness.   ED Course: Hemoglobin 10.8, blood pressure stabilized in the low 100s, vascular surgery taking patient to the OR.  Review of Systems: Unable to perform, patient nonverbal.  Past Medical History:  Diagnosis Date  . Carotid bruit   . Dyslipidemia   . PAD (peripheral artery disease) ([place])   . Pneumonia   . Throat cancer ([place])   . Tobacco dependence     Past Surgical History:  Procedure Laterality Date  . AMPUTATION Right 12/18/2014   Procedure: AMPUTATION ABOVE KNEE;  Surgeon:  Elam Dutch, MD;  Location: [place];  Service: Vascular;  Laterality: Right;  . AORTA - BILATERAL FEMORAL ARTERY BYPASS GRAFT    . COLONOSCOPY    . FALSE ANEURYSM REPAIR Left 12/30/2015   Procedure: Excision of Left FEMORAL PSEUDO-ANEURYSM;  Surgeon: Waynetta Sandy, MD;  Location: [place];  Service: Vascular;  Laterality: Left;  . FEMORAL-FEMORAL BYPASS GRAFT Left 12/30/2015   Procedure: BYPASS From Aorta-Femoral to Femoral Bypass Graft using Dacron Graft;  Surgeon: Waynetta Sandy, MD;  Location: [place];  Service: Vascular;  Laterality: Left;  . LOWER EXTREMITY ANGIOGRAM Bilateral 12/14/2014   Procedure: Lower Extremity Angiogram;  Surgeon: Elam Dutch, MD;  Location: [place];  Service: Cardiovascular;  Laterality: Bilateral;  . PEG TUBE PLACEMENT    . PEG TUBE REMOVAL    . PERIPHERAL VASCULAR CATHETERIZATION N/A 12/14/2014   Procedure: Abdominal Aortogram;  Surgeon: Elam Dutch, MD;  Location: [place] [facility];  Service: Cardiovascular;  Laterality: N/A;  . TONSILLECTOMY    . TOTAL LARYNGECTOMY       reports that she has been smoking cigarettes. She has a 35.00 pack-year smoking history. She has never used smokeless tobacco. She reports that she does not drink alcohol and does not use drugs.  No Known Allergies  Family History  Problem Relation Age of Onset  . Cancer Mother   . Lung cancer Brother      Prior to Admission medications   Medication Sig Start Date End Date Taking? Authorizing Provider  aspirin EC 81 MG tablet Take by mouth.    [provider]  gabapentin (NEURONTIN) 400 MG capsule Take 400 mg by mouth See admin instructions. Two to three times a day 11/26/14   [provider]  KLOR-CON M20 20 MEQ tablet Take 20 mEq by mouth 2 (two) times daily.  12/10/14   [provider]  lisinopril (PRINIVIL,ZESTRIL) 10 MG tablet TK 1 T PO D 06/06/16   [provider]    Physical Exam: Vitals:   08/14/20  1132 08/14/20 1133 08/14/20 1145 08/14/20 1200  BP: (!) 75/56 92/68 (!) 97/54 (!) 99/59  Pulse: 84 90 83 (!) 48  Resp: 20 15 20 18   Temp:      TempSrc:      SpO2:  (!) 88% 93% 96%  Weight:      Height:        Constitutional: NAD, calm, comfortable Vitals:   08/14/20 1132 08/14/20 1133 08/14/20 1145 08/14/20 1200  BP: (!) 75/56 92/68 (!) 97/54 (!) 99/59  Pulse: 84 90 83 (!) 48  Resp: 20 15 20 18   Temp:      TempSrc:      SpO2:  (!) 88% 93% 96%  Weight:      Height:       Eyes: PERRL, lids and conjunctivae normal ENMT: Mucous membranes are moist. Posterior pharynx clear of any exudate or lesions.Normal dentition.  Neck: normal, supple, no masses, no thyromegaly Respiratory: clear to auscultation bilaterally, no wheezing, no crackles. Normal respiratory effort. No accessory muscle use.  Cardiovascular: Regular rate and rhythm, no murmurs / rubs / gallops. No extremity edema.  Large left groin hematoma, pulsitive Abdomen: no tenderness, no masses palpated. No hepatosplenomegaly. Bowel sounds positive.  Musculoskeletal: no clubbing / cyanosis. No joint deformity upper and lower extremities. Good ROM, no contractures. Normal muscle tone.  Skin: no rashes, lesions, ulcers. No induration Neurologic: CN 2-12 grossly intact. Sensation intact, DTR normal. Strength 5/5 in all 4.  Psychiatric: Normal judgment and insight. Alert and oriented x 3. Normal mood.     Labs on Admission: I have personally reviewed following labs and imaging studies  CBC: Recent Labs  Lab 08/14/20 1213  HGB 10.5*  HCT 76.7*   Basic Metabolic Panel: Recent Labs  Lab 08/14/20 1213  NA 134*  K 3.4*  CL 102  GLUCOSE 225*  BUN 8  CREATININE 0.60   GFR: Estimated Creatinine Clearance: 33.5 mL/min (by C-G formula based on SCr of 0.6 mg/dL). Liver Function Tests: No results for input(s): AST, ALT, ALKPHOS, BILITOT, PROT, ALBUMIN in the last 168 hours. No results for input(s): LIPASE, AMYLASE in the  last 168 hours. No results for input(s): AMMONIA in the last 168 hours. Coagulation Profile: No results for input(s): INR, PROTIME in the last 168 hours. Cardiac Enzymes: No results for input(s): CKTOTAL, CKMB, CKMBINDEX, TROPONINI in the last 168 hours. BNP (last 3 results) No results for input(s): PROBNP in the last 8760 hours. HbA1C: No results for input(s): HGBA1C in the last 72 hours. CBG: No results for input(s): GLUCAP in the last 168 hours. Lipid Profile: No results for input(s): CHOL, HDL, LDLCALC, TRIG, CHOLHDL, LDLDIRECT in the last 72 hours. Thyroid Function Tests: No results for input(s): TSH, T4TOTAL, FREET4, T3FREE, THYROIDAB in the last 72 hours. Anemia Panel: No results for input(s): VITAMINB12, FOLATE, FERRITIN, TIBC, IRON, RETICCTPCT in the last 72 hours. Urine analysis:    Component Value Date/Time   COLORURINE YELLOW 12/29/2015 2030   APPEARANCEUR CLOUDY (A) 12/29/2015 2030   LABSPEC 1.020 12/29/2015 2030   PHURINE 7.0  12/29/2015 2030   GLUCOSEU 100 (A) 12/29/2015 2030   [place] 12/29/2015 2030   [place] 12/29/2015 2030   [place] 12/29/2015 2030   PROTEINUR NEGATIVE 12/29/2015 2030   UROBILINOGEN 1.0 12/20/2009 1951   NITRITE POSITIVE (A) 12/29/2015 2030   LEUKOCYTESUR MODERATE (A) 12/29/2015 2030    Radiological Exams on Admission: DG Chest Port 1 View  Result Date: 08/14/2020 CLINICAL DATA:  75 year old female with history of shortness of breath. EXAM: PORTABLE CHEST 1 VIEW COMPARISON:  Chest x-ray 12/30/2015. FINDINGS: Lung volumes are normal. No consolidative airspace disease. No pleural effusions. No pneumothorax. Chronic interstitial prominence and architectural distortion in the medial aspect of the apex of the right upper lobe, similar to numerous prior studies, most compatible with chronic post infectious or inflammatory scarring. No evidence of pulmonary edema. Heart size is normal. Upper mediastinal contours are  within normal limits. Aortic atherosclerosis. Surgical clips project over the apex of the left hemithorax and the left axillary region. IMPRESSION: 1. No radiographic evidence of acute cardiopulmonary disease. 2. Aortic atherosclerosis. 3. Probable chronic post infectious or inflammatory scarring in the apex of the right upper lobe, as above. Electronically Signed   By: Vinnie Langton M.D.   On: 08/14/2020 12:01    EKG: Independently reviewed. Sinus rhythm, no acute ST-T changes.  Assessment/Plan Active Problems:   Aneurysm ([place])  (please populate well all problems here in Problem List. (For example, if patient is on BP meds at home and you resume or decide to hold them, it is a problem that needs to be her. Same for CAD, COPD, HLD and so on)  Rupture of left groin pseudoaneurysm -Emergency or for repair of pseudoaneurysm as per vascular surgery -Hold chemical DVT prophylaxis for  today  Acute blood loss anemia -Vital signs stabilized with IV boluses, bleeding site has a large hematoma but no signs of active bleeding, recheck H&H this afternoon. -Type and screen  Hypotension -secondary to acute blood loss, more stable now. Hold home BP meds.  Hypokalemia -P.o. replace  Hyperglycemia -No history of diabetes, check A1c.  PVD -resume ASA in AM.  HTN -Hold home BP meds  Cigar smoker -Educated to quit, nicotine patch for now  Moderate protein calorie malnutrition -Body weight has been stable for the last 2+ years, start protein supplement.  Throat CA, lung CA -Oncology F/U   DVT prophylaxis: SCD Code Status: Full Code Family Communication: None at bedside Disposition Plan: Expect more than 2 midnight hospital stay Consults called: Dr. Tora Perches Admission status: PCU   Lequita Halt MD [facility] Pager [phone]  08/14/2020, 12:48 PM

## 2020-08-14 NOTE — ED Notes (Signed)
[facility] called and reported that the covid swab was negative.  Fax number given they will be faxing it over now

## 2020-08-14 NOTE — Anesthesia Procedure Notes (Signed)
Central Venous Catheter Insertion Performed by: Darral Dash, DO, anesthesiologist Start/End6/03/2020 3:00 PM, 08/14/2020 3:10 PM Patient location: Pre-op. Preanesthetic checklist: patient identified, IV checked, site marked, risks and benefits discussed, surgical consent, monitors and equipment checked, pre-op evaluation, timeout performed and anesthesia consent Position: Trendelenburg Lidocaine 1% used for infiltration and patient sedated Hand hygiene performed , maximum sterile barriers used  and Seldinger technique used Catheter size: 8 Fr Total catheter length 16. Central line was placed.Double lumen Procedure performed using ultrasound guided technique. Ultrasound Notes:image(s) printed for medical record Attempts: 1 Following insertion, dressing applied, line sutured and Biopatch. Post procedure assessment: blood return through all ports  Patient tolerated the procedure well with no immediate complications.

## 2020-08-14 NOTE — ED Provider Notes (Signed)
[place] EMERGENCY DEPARTMENT Provider Note   CSN: [id number] Arrival date & time: 08/14/20  1114     History Chief Complaint  Patient presents with  . Groin Injury    Heather Jimenez is a 75 y.o. female.  75 year old female with prior medical history as detailed below presents as a transfer from [facility] ED.  Patient presented there with significant bleeding from aneurysmal mass in the left groin.  Bleeding was controlled at [facility] [place].  Patient's case was discussed with Dr. Trula Slade of vascular surgery.  Patient was transferred to [facility] for vascular eval.  In route from [place] 1 L of normal saline was administered.  The patient is mentating well on arrival.  There is no active bleeding from the left groin on arrival to [facility].  Patient appears comfortable.  Vascular surgery notified of patient's arrival.  Patient is not vocal at baseline.  She is status post laryngectomy.  She does not have her electrolarynx with her.   The history is provided by the patient, medical records and the EMS personnel.  Illness Location:  Bleeding from left femoral aneurysmal mass Severity:  Moderate Onset quality:  Sudden Duration:  1 day Timing:  Constant Progression:  Partially resolved Chronicity:  New      Past Medical History:  Diagnosis Date  . Carotid bruit   . Dyslipidemia   . PAD (peripheral artery disease) ([place])   . Pneumonia   . Throat cancer ([place])   . Tobacco dependence     Patient Active Problem List   Diagnosis Date Noted  . Aneurysm ([place]) 08/14/2020  . Aftercare following surgery of the circulatory system 01/17/2016  . S/P bypass graft of extremity 01/17/2016  . 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 PAOD (peripheral arterial occlusive disease) ([place]) 01/05/2011  . Hypercholesterolemia 01/05/2011  . CAD (coronary artery disease) 01/05/2011  . Tobacco abuse 01/05/2011    Past Surgical History:  Procedure Laterality Date  .  AMPUTATION Right 12/18/2014   Procedure: AMPUTATION ABOVE KNEE;  Surgeon: Elam Dutch, MD;  Location: [place];  Service: Vascular;  Laterality: Right;  . AORTA - BILATERAL FEMORAL ARTERY BYPASS GRAFT    . COLONOSCOPY    . FALSE ANEURYSM REPAIR Left 12/30/2015   Procedure: Excision of Left FEMORAL PSEUDO-ANEURYSM;  Surgeon: Waynetta Sandy, MD;  Location: [place];  Service: Vascular;  Laterality: Left;  . FEMORAL-FEMORAL BYPASS GRAFT Left 12/30/2015   Procedure: BYPASS From Aorta-Femoral to Femoral Bypass Graft using Dacron Graft;  Surgeon: Waynetta Sandy, MD;  Location: [place];  Service: Vascular;  Laterality: Left;  . LOWER EXTREMITY ANGIOGRAM Bilateral 12/14/2014   Procedure: Lower Extremity Angiogram;  Surgeon: Elam Dutch, MD;  Location: [place] [facility];  Service: Cardiovascular;  Laterality: Bilateral;  . PEG TUBE PLACEMENT    . PEG TUBE REMOVAL    . PERIPHERAL VASCULAR CATHETERIZATION N/A 12/14/2014   Procedure: Abdominal Aortogram;  Surgeon: Elam Dutch, MD;  Location: [place] [facility];  Service: Cardiovascular;  Laterality: N/A;  . TONSILLECTOMY    . TOTAL LARYNGECTOMY       OB History   No obstetric history on file.     Family History  Problem Relation Age of Onset  . Cancer Mother   . Lung cancer Brother     Social History   Tobacco Use  . Smoking status: Light Tobacco Smoker    Packs/day: 1.00    Years: 35.00    Pack  years: 35.00    Types: Cigarettes  . Smokeless tobacco: Never Used  Substance Use Topics  . Alcohol use: No    Alcohol/week: 0.0 standard drinks  . Drug use: No    Home Medications Prior to Admission medications   Medication Sig Start Date End Date Taking? Authorizing Provider  aspirin EC 81 MG tablet Take by mouth.    [provider]  gabapentin (NEURONTIN) 400 MG capsule Take 400 mg by mouth See admin instructions. Two to three times a day 11/26/14   [provider]  KLOR-CON M20 20 MEQ tablet  Take 20 mEq by mouth 2 (two) times daily.  12/10/14   [provider]  lisinopril (PRINIVIL,ZESTRIL) 10 MG tablet TK 1 T PO D 06/06/16   [provider]    Allergies    Patient has no known allergies.  Review of Systems   Review of Systems  All other systems reviewed and are negative.   Physical Exam Updated Vital Signs BP (!) 99/59   Pulse (!) 48   Temp (!) 97.5 F (36.4 C) (Oral)   Resp 18   Ht 5\' 3"  (1.6 m)   Wt 34.9 kg   SpO2 96%   BMI 13.63 kg/m   Physical Exam Vitals and nursing note reviewed.  Constitutional:      General: She is not in acute distress.    Appearance: Normal appearance. She is well-developed.  HENT:     Head: Normocephalic and atraumatic.  Eyes:     Conjunctiva/sclera: Conjunctivae normal.     Pupils: Pupils are equal, round, and reactive to light.  Cardiovascular:     Rate and Rhythm: Normal rate and regular rhythm.     Heart sounds: Normal heart sounds.  Pulmonary:     Effort: Pulmonary effort is normal. No respiratory distress.     Breath sounds: Normal breath sounds.  Abdominal:     General: There is no distension.     Palpations: Abdomen is soft.     Tenderness: There is no abdominal tenderness.  Musculoskeletal:        General: No deformity. Normal range of motion.     Cervical back: Normal range of motion and neck supple.     Comments: Pulsatile aneurysmal mass in the left groin.  No active bleeding noted.  Skin:    General: Skin is warm and dry.  Neurological:     General: No focal deficit present.     Mental Status: She is alert and oriented to person, place, and time.     ED Results / Procedures / Treatments   Labs (all labs ordered are listed, but only abnormal results are displayed) Labs Reviewed  I-STAT CHEM 8, ED - Abnormal; Notable for the following components:      Result Value   Sodium 134 (*)    Potassium 3.4 (*)    Glucose, Bld 225 (*)    Calcium, Ion 1.02 (*)    TCO2 18 (*)    Hemoglobin  10.5 (*)    HCT 31.0 (*)    All other components within normal limits  RESP PANEL BY RT-PCR (FLU A&B, COVID) ARPGX2  BASIC METABOLIC PANEL  CBC WITH DIFFERENTIAL/PLATELET  PROTIME-INR  TYPE AND SCREEN    EKG EKG Interpretation  Date/Time:  Wednesday August 14 2020 11:19:28 EDT Ventricular Rate:  91 PR Interval:  151 QRS Duration: 98 QT Interval:  405 QTC Calculation: 496 R Axis:   10 Text Interpretation: Sinus rhythm Left  atrial enlargement RSR' in V1 or V2, right VCD or RVH Inferior infarct, age indeterminate Confirmed by Dene Gentry [phone]) on 08/14/2020 11:27:45 AM   Radiology DG Chest Port 1 View  Result Date: 08/14/2020 CLINICAL DATA:  75 year old female with history of shortness of breath. EXAM: PORTABLE CHEST 1 VIEW COMPARISON:  Chest x-ray 12/30/2015. FINDINGS: Lung volumes are normal. No consolidative airspace disease. No pleural effusions. No pneumothorax. Chronic interstitial prominence and architectural distortion in the medial aspect of the apex of the right upper lobe, similar to numerous prior studies, most compatible with chronic post infectious or inflammatory scarring. No evidence of pulmonary edema. Heart size is normal. Upper mediastinal contours are within normal limits. Aortic atherosclerosis. Surgical clips project over the apex of the left hemithorax and the left axillary region. IMPRESSION: 1. No radiographic evidence of acute cardiopulmonary disease. 2. Aortic atherosclerosis. 3. Probable chronic post infectious or inflammatory scarring in the apex of the right upper lobe, as above. Electronically Signed   By: Vinnie Langton M.D.   On: 08/14/2020 12:01    Procedures Procedures  CRITICAL CARE Performed by: Valarie Merino   Total critical care time: 45 minutes  Critical care time was exclusive of separately billable procedures and treating other patients.  Critical care was necessary to treat or prevent imminent or life-threatening  deterioration.  Critical care was time spent personally by me on the following activities: development of treatment plan with patient and/or surrogate as well as nursing, discussions with consultants, evaluation of patient's response to treatment, examination of patient, obtaining history from patient or surrogate, ordering and performing treatments and interventions, ordering and review of laboratory studies, ordering and review of radiographic studies, pulse oximetry and re-evaluation of patient's condition.   Medications Ordered in ED Medications - No data to display  ED Course  I have reviewed the triage vital signs and the nursing notes.  Pertinent labs & imaging results that were available during my care of the patient were reviewed by me and considered in my medical decision making (see chart for details).    MDM Rules/Calculators/A&P                          MDM  MSE complete  Heather Jimenez was evaluated in Emergency Department on 08/14/2020 for the symptoms described in the history of present illness. She was evaluated in the context of the global COVID-19 pandemic, which necessitated consideration that the patient might be at risk for infection with the SARS-CoV-2 virus that causes COVID-19. Institutional protocols and algorithms that pertain to the evaluation of patients at risk for COVID-19 are in a state of rapid change based on information released by regulatory bodies including the CDC and federal and state organizations. These policies and algorithms were followed during the patient's care in the ED.  Patient arrives from [facility] ED as transfer in for vascular surgery eval.  Patient with left femoral groin aneurysmal mass with bleeding.  Vascular surgery made aware of case upon patient's arrival to the [facility] ED.  Patient without active bleeding.  Hemodynamics appear to be stable at this time.  Case discussed with hospitalist service Dr. Roosevelt Locks who will  evaluate.  Vascular plans to take the patient to the OR for further evaluation and treatment.   Final Clinical Impression(s) / ED Diagnoses Final diagnoses:  Femoral artery aneurysm ([place])    Rx / DC Orders ED Discharge Orders    None  Valarie Merino, MD 08/14/20 [phone]

## 2020-08-14 NOTE — Anesthesia Preprocedure Evaluation (Addendum)
Anesthesia Evaluation  Patient identified by MRN, date of birth, ID band Patient awake    Reviewed: Allergy & Precautions, Patient's Chart, lab work & pertinent test results  Airway Mallampati: Trach       Dental   Pulmonary neg pulmonary ROS, Current Smoker,    Pulmonary exam normal        Cardiovascular hypertension, Pt. on medications + Peripheral Vascular Disease   Rhythm:Regular Rate:Normal  ECG: SR, rate 91   Neuro/Psych negative neurological ROS  negative psych ROS   GI/Hepatic negative GI ROS, Neg liver ROS,   Endo/Other  negative endocrine ROS  Renal/GU negative Renal ROS     Musculoskeletal negative musculoskeletal ROS (+)   Abdominal (+)  Abdomen: soft.    Peds  Hematology  (+) anemia , HLD   Anesthesia Other Findings Pseudoaneurysm. PAD s/p aortobifem/ fem-fem 2017. Stoma usage with armored ETT prior procedures.   Reproductive/Obstetrics                            Anesthesia Physical Anesthesia Plan  ASA: IV  Anesthesia Plan: General   Post-op Pain Management:    Induction: Intravenous  PONV Risk Score and Plan: 2 and Treatment may vary due to age or medical condition  Airway Management Planned: Oral ETT  Additional Equipment: Arterial line and CVP  Intra-op Plan:   Post-operative Plan: Possible Post-op intubation/ventilation  Informed Consent: I have reviewed the patients History and Physical, chart, labs and discussed the procedure including the risks, benefits and alternatives for the proposed anesthesia with the patient or authorized representative who has indicated his/her understanding and acceptance.     Dental advisory given  Plan Discussed with: CRNA  Anesthesia Plan Comments: (Lab Results      Component                Value               Date                      WBC                      17.1 (H)            08/14/2020                HGB                       10.5 (L)            08/14/2020                HCT                      31.0 (L)            08/14/2020                MCV                      91.2                08/14/2020                PLT                      232  08/14/2020           Lab Results      Component                Value               Date                      NA                       134 (L)             08/14/2020                K                        3.4 (L)             08/14/2020                CO2                      16 (L)              08/14/2020                GLUCOSE                  225 (H)             08/14/2020                BUN                      8                   08/14/2020                CREATININE               0.60                08/14/2020                CALCIUM                  7.6 (L)             08/14/2020                GFRNONAA                 >60                 08/14/2020                GFRAA                    >60                 12/31/2015          )       Anesthesia Quick Evaluation

## 2020-08-14 NOTE — Op Note (Addendum)
OPERATIVE REPORT  DATE OF SURGERY: 08/14/2020  PATIENT: Heather Jimenez, 75 y.o. female MRN: [id number]  DOB: 10/31/1945  PRE-OPERATIVE DIAGNOSIS: Recurrent left femoral false aneurysm with the disruption into the skin and major bleed  POST-OPERATIVE DIAGNOSIS:  Same  PROCEDURE: Repair of left femoral false aneurysm with interposition of 6 mm PTFE from old left limb of aortofemoral graft to profundus femoris bypass.  Excision of nonviable skin left groin  SURGEON:  Curt Jews, M.D.  PHYSICIAN ASSISTANT: Dr. Fortunato Curling, Liana Crocker, PA-C  The assistant was needed for exposure and to expedite the case  ANESTHESIA: General  EBL: per anesthesia record  Total I/O In: 0350 [I.V.:1600; Blood:630; IV Piggyback:1000] Out: 450 [Urine:250; Blood:200]  BLOOD ADMINISTERED: none  DRAINS: none  SPECIMEN: none  COUNTS CORRECT:  YES  PATIENT DISPOSITION:  PACU - hemodynamically stable  PROCEDURE DETAILS: Patient was taken operating placed supine position where the area of the left groin left lower quadrant prepped draped you sterile fashion.  Incision was made over the inguinal ligament and carried down to the level of the inguinal ligament.  The inguinal ligament was proximal partially divided.  There was then a disrupted area where the patient had a major bleed with a nonviable skin further distal in the groin.  The left limb of the aortofemoral graft was exposed and was mobilized to the level of being able to occlude.  There was some old fluid around but no frank pus.  This was sent for culture.  Patient was given 4000 units of intravenous heparin and the graft was occluded.  Incision was then extended down into involve the disrupted skin and false aneurysm.  There was backbleeding from the profundus femoris and this was controlled with digital pressure.  The limb was completely freed up from the common femoral anastomosis.  The limb of the graft had been completely disrupted from the  lateral half of the incision.  The graft was removed completely from the artery and the graft was reoccluded further proximally.  There was complete occlusion of the external iliac with no inflow from this artery.  The deep femoral artery was exposed to allow anastomosis.  A 3 Fogarty catheter with a stopcock was positioned in the profunda and the balloon was inflated and the stopcock was deployed for distal control.  A 6 mm propatent Gore-Tex graft was brought onto the field and was spatulated and sewn into into the profundus femoris artery with a running 6-0 Prolene suture.  The Fogarty catheter was deflated and good anastomosis was encountered.  This was flushed with heparinized saline and reoccluded.  The 6 mm graft was then spatulated and the old 8 mm graft was also spatulated.  The patient had a prior interposition 5 years ago and this was removed in its entirety.  The 6 mm Gore-Tex graft was sewn to the original 8 mm Dacron graft with running 6-0 Prolene suture.  The usual flushing maneuvers were undertaken and anastomosis was completed.  Flow was restored to the foot.  Was good Doppler flow through the deep femoral artery.  The patient was given 50 mg of protamine to reverse heparin.  Wounds irrigated with saline.  Hemostasis electrocautery.  All nonviable tissue was removed.  An ellipse of skin was removed from the medial aspect of the incision where the patient had ruptured into the skin.  The deep tissues were closed with 2 layers of 2-0 Vicryl suture.  The skin was closed with 3-0 nylon mattress  sutures.  Sterile dressing was applied and the patient was transferred to the recovery room in critical condition   Rosetta Posner, M.D., [facility] 08/14/2020 5:41 PM  Note: Portions of this report may have been transcribed using voice recognition software.  Every effort has been made to ensure accuracy; however, inadvertent computerized transcription errors may still be present.

## 2020-08-14 NOTE — Consult Note (Signed)
NAME:  Heather Jimenez, MRN:  [id number], DOB:  07/26/1945, LOS: 0 ADMISSION DATE:  08/14/2020, CONSULTATION DATE:  08/14/20 REFERRING MD:  Early, CHIEF COMPLAINT:  resp failure   History of Present Illness:  75 year old woman with hx of PVD s/p fem-fram, aortofem bypass, left femoral pseudoaneurysm, right AKA, smoker, SCC of hypopharynx post laryngectomy.  She unfortunately developed left groin bleeding profuse at home, passed out and was brought to ER.  She underwent emergent left femoral false aneurysm repair with dacron placement from old left limb of aortofem graft to profundus femoris bypass.  She comes back to ICU on vent with PCCM asked to take over.  She is on pressors.  Pertinent  Medical History  Tobacco dependence Throat cancer PVD Protein calorie malnutrition  Significant Hospital Events: Including procedures, antibiotic start and stop dates in addition to other pertinent events   . 6/1 admission, operation, ICU  Interim History / Subjective:  Consulted, she is still under anesthesia effects so history per chart review.  Objective   Blood pressure (!) 138/94, pulse (!) 118, temperature (!) 96.9 F (36.1 C), temperature source Axillary, resp. rate 11, height 5\' 3"  (1.6 m), weight 33.8 kg, SpO2 100 %.    Vent Mode: PRVC FiO2 (%):  [50 %-100 %] 50 % Set Rate:  [15 bmp] 15 bmp Vt Set:  [410 mL] 410 mL PEEP:  [5 cmH20] 5 cmH20 Plateau Pressure:  [9 cmH20] 9 cmH20   Intake/Output Summary (Last 24 hours) at 08/14/2020 1852 Last data filed at 08/14/2020 1741 Gross per 24 hour  Intake 3530 ml  Output 600 ml  Net 2930 ml   Filed Weights   08/14/20 1120 08/14/20 1825  Weight: 34.9 kg 33.8 kg    Examination: Cachetic chronically ill woman lying in bed L groin dressed without strikethrough GCS3 from anesthesia Poor breath sounds on left Rhonci bilaterally  Patient Lines/Drains/Airways Status    Active Line/Drains/Airways    Name Placement date Placement time Site Days    Arterial Line 08/14/20 Right Brachial 08/14/20  1520  Brachial  less than 1   Peripheral IV 08/14/20 18 G Left Antecubital 08/14/20  1100  Antecubital  less than 1   Peripheral IV 08/14/20 20 G Right Forearm 08/14/20  1045  Forearm  less than 1   CVC Double Lumen 08/14/20 Right Internal jugular 16 cm 08/14/20  1531  -- less than 1   Urethral Catheter K.Love RN Latex 14 Fr. 08/14/20  1522  Latex  less than 1   Incision (Closed) 12/18/14 Thigh Right 12/18/14  1255  -- 2066   Incision (Closed) 12/30/15 Groin Left 12/30/15  1117  -- 1689   Incision (Closed) 08/14/20 Groin Left 08/14/20  1729  -- less than 1          Labs/imaging that I havepersonally reviewed  (right click and "Reselect all SmartList Selections" daily)  WBC 17.1 Hgb 10>>7>>10 after transfusion ETT appears to terminate over right mainstem, extensive left sided airspace opacities Resolved Hospital Problem list   n/a  Assessment & Plan:  Postop hypoxemic respiratory failure secondary to aspiration pneumonitis S/p laryngectomy Severe PVD with infected pseudoaneusym post repair Postoperative oozing Postop shock combination hemorrhage and anesthesia induced  - Vent support, pull back ETT - Fentanyl, precedex, prop for sedation - Wean pressors titrated to MAP 65 - Continue vanc/zosyn f/u intraop cultures, tracheal aspirate if able - Stat CBC/coags/BMP/ABG, maintain normal INR/PTT/plts/pH/temp - Monitor left graft site - Wean pressors  Best practice (right click and "Reselect all SmartList Selections" daily)  Diet:  NPO Pain/Anxiety/Delirium protocol (if indicated): Yes (RASS goal -2) VAP protocol (if indicated): Yes DVT prophylaxis: SCD GI prophylaxis: PPI Glucose control:  SSI Yes Central venous access:  Yes, and it is still needed Arterial line:  Yes, and it is still needed Foley:  Yes, and it is still needed Mobility:  bed rest  PT consulted: Yes Last date of multidisciplinary goals of care discussion  [pending] Code Status:  full code Disposition: ICU pending vent liberation  Labs   CBC: Recent Labs  Lab 08/14/20 1123 08/14/20 1213 08/14/20 1532 08/14/20 1635 08/14/20 1759  WBC 17.1*  --   --   --   --   NEUTROABS 14.7*  --   --   --   --   HGB 9.6* 10.5* 7.1* 11.9* 10.5*  HCT 30.9* 31.0* 21.0* 35.0* 31.0*  MCV 91.2  --   --   --   --   PLT 232  --   --   --   --     Basic Metabolic Panel: Recent Labs  Lab 08/14/20 1123 08/14/20 1213 08/14/20 1532 08/14/20 1635 08/14/20 1759  NA 133* 134* 136 137 139  K 3.4* 3.4* 3.2* 4.5 3.5  CL 101 102  --   --   --   CO2 16*  --   --   --   --   GLUCOSE 226* 225*  --   --   --   BUN 8 8  --   --   --   CREATININE 0.87 0.60  --   --   --   CALCIUM 7.6*  --   --   --   --    GFR: Estimated Creatinine Clearance: 32.4 mL/min (by C-G formula based on SCr of 0.6 mg/dL). Recent Labs  Lab 08/14/20 1123  WBC 17.1*    Liver Function Tests: No results for input(s): AST, ALT, ALKPHOS, BILITOT, PROT, ALBUMIN in the last 168 hours. No results for input(s): LIPASE, AMYLASE in the last 168 hours. No results for input(s): AMMONIA in the last 168 hours.  ABG    Component Value Date/Time   PHART 7.325 (L) 08/14/2020 1759   PCO2ART 38.3 08/14/2020 1759   PO2ART 95 08/14/2020 1759   HCO3 20.3 08/14/2020 1759   TCO2 22 08/14/2020 1759   ACIDBASEDEF 6.0 (H) 08/14/2020 1759   O2SAT 97.0 08/14/2020 1759     Coagulation Profile: Recent Labs  Lab 08/14/20 1123  INR 1.1    Cardiac Enzymes: No results for input(s): CKTOTAL, CKMB, CKMBINDEX, TROPONINI in the last 168 hours.  HbA1C: No results found for: HGBA1C  CBG: No results for input(s): GLUCAP in the last 168 hours.  Review of Systems:   Comatose  Past Medical History:  She,  has a past medical history of Carotid bruit, Dyslipidemia, PAD (peripheral artery disease) ([place]), Pneumonia, Throat cancer ([place]), and Tobacco dependence.   Surgical History:   Past Surgical  History:  Procedure Laterality Date  . AMPUTATION Right 12/18/2014   Procedure: AMPUTATION ABOVE KNEE;  Surgeon: Elam Dutch, MD;  Location: [place];  Service: Vascular;  Laterality: Right;  . AORTA - BILATERAL FEMORAL ARTERY BYPASS GRAFT    . COLONOSCOPY    . FALSE ANEURYSM REPAIR Left 12/30/2015   Procedure: Excision of Left FEMORAL PSEUDO-ANEURYSM;  Surgeon: Waynetta Sandy, MD;  Location: [facility];  Service: Vascular;  Laterality: Left;  . FEMORAL-FEMORAL  BYPASS GRAFT Left 12/30/2015   Procedure: BYPASS From Aorta-Femoral to Femoral Bypass Graft using Dacron Graft;  Surgeon: Waynetta Sandy, MD;  Location: [place];  Service: Vascular;  Laterality: Left;  . LOWER EXTREMITY ANGIOGRAM Bilateral 12/14/2014   Procedure: Lower Extremity Angiogram;  Surgeon: Elam Dutch, MD;  Location: [facility];  Service: Cardiovascular;  Laterality: Bilateral;  . PEG TUBE PLACEMENT    . PEG TUBE REMOVAL    . PERIPHERAL VASCULAR CATHETERIZATION N/A 12/14/2014   Procedure: Abdominal Aortogram;  Surgeon: Elam Dutch, MD;  Location: [facility];  Service: Cardiovascular;  Laterality: N/A;  . TONSILLECTOMY    . TOTAL LARYNGECTOMY       Social History:   reports that she has been smoking cigarettes. She has a 35.00 pack-year smoking history. She has never used smokeless tobacco. She reports that she does not drink alcohol and does not use drugs.   Family History:  Her family history includes Cancer in her mother; Lung cancer in her brother.   Allergies No Known Allergies   Home Medications  Prior to Admission medications   Medication Sig Start Date End Date Taking? Authorizing Provider  gabapentin (NEURONTIN) 400 MG capsule Take 400 mg by mouth 3 (three) times daily. 11/26/14  Yes [provider]  lisinopril (PRINIVIL,ZESTRIL) 10 MG tablet Take 10 mg by mouth daily. 06/06/16  Yes [provider]  aspirin EC 81 MG tablet Take by mouth. Patient not  taking: No sig reported    [provider]  KLOR-CON M20 20 MEQ tablet Take 20 mEq by mouth 2 (two) times daily.  Patient not taking: No sig reported 12/10/14   [provider]     Critical care time: 41 minutes not including any separately billable procedures

## 2020-08-14 NOTE — Anesthesia Procedure Notes (Signed)
Arterial Line Insertion Start/End6/03/2020 3:20 PM, 08/14/2020 3:25 PM Performed by: Darral Dash, DO, anesthesiologist  Patient location: OR. Preanesthetic checklist: patient identified, IV checked, site marked, risks and benefits discussed, surgical consent, monitors and equipment checked, pre-op evaluation, timeout performed and anesthesia consent Lidocaine 1% used for infiltration Right, brachial was placed Catheter size: 20 Fr Hand hygiene performed  and maximum sterile barriers used   Attempts: 1 Procedure performed using ultrasound guided technique. Ultrasound Notes:anatomy identified Following insertion, dressing applied and Biopatch. Post procedure assessment: normal and unchanged

## 2020-08-14 NOTE — Progress Notes (Signed)
Pharmacy Antibiotic Note  Heather Jimenez is a 75 y.o. female admitted on 08/14/2020 with rupture of L groin pseudoaneurysm, L groin bleeding; pt is S/P pseudoaneurysm repair by VVS this afternoon. Pharmacy has been consulted for vancomycin and Zosyn dosing wound infection in L groin.  WBC 17.1, afebrile; Scr 0.60, CrCl 32.4 ml/min  Plan: Zosyn 3.375 gm IV Q 8 hrs (extended infusion) Vancomycin 750 mg IV Q 48 hrs (estimated vancomycin AUC, using Scr 0.80, is 492.2; goal vancomycin AUC is 400-550) Monitor WBC, temp, clinical improvement, cultures, renal function, vancomycin levels as indicated  Height: 5\' 3"  (160 cm) Weight: 33.8 kg (74 lb 8.3 oz) IBW/kg (Calculated) : 52.4  Temp (24hrs), Avg:97.5 F (36.4 C), Min:96.9 F (36.1 C), Max:98 F (36.7 C)  Recent Labs  Lab 08/14/20 1123 08/14/20 1213  WBC 17.1*  --   CREATININE 0.87 0.60    Estimated Creatinine Clearance: 32.4 mL/min (by C-G formula based on SCr of 0.6 mg/dL).    No Known Allergies  Antimicrobials this admission: Cefazolin X 1 pre op 6/1 Vancomycin  6/1 >> Zosyn 6/1 >>  Microbiology results: 6/1 Surgical cx: pending 6/1 Surgical cx (fungus): pending  Thank you for allowing pharmacy to be a part of this patient's care.  Gillermina Hu, PharmD, BCPS, [facility] Clinical Pharmacist 08/14/2020 6:37 PM

## 2020-08-14 NOTE — ED Triage Notes (Signed)
Pt was sent by Heather Jimenez after standing in the bathroom at home and rupturing an inguinal newly fixed graft that is possible for aneurysm ems found pt. In bathroom with "a lot" of blood .  Pt sent to [facility] where they placed one stitch to the left groin.  Currently bleeding controlled pt alert and oriented

## 2020-08-14 NOTE — Transfer of Care (Signed)
Immediate Anesthesia Transfer of Care Note  Patient: Heather Jimenez  Procedure(s) Performed: Repair Left Femoral Pseudoaneurysm (Left )  Patient Location: ICU  Anesthesia Type:General  Level of Consciousness: unresponsive and Patient remains intubated per anesthesia plan  Airway & Oxygen Therapy: Patient remains intubated per anesthesia plan and Patient placed on Ventilator (see vital sign flow sheet for setting)  Post-op Assessment: Report given to RN and Post -op Vital signs reviewed and stable  Post vital signs: Reviewed and stable  Last Vitals:  Vitals Value Taken Time  BP 138/94 08/14/20 1821  Temp 36.1 C 08/14/20 1821  Pulse 111 08/14/20 1828  Resp 15 08/14/20 1828  SpO2 100 % 08/14/20 1828  Vitals shown include unvalidated device data.  Last Pain:  Vitals:   08/14/20 1821  TempSrc: Axillary  PainSc:          Complications: No complications documented.

## 2020-08-14 NOTE — ED Notes (Signed)
CT notified that surgeon would like to expedite CT scan they confirmed they were on the way for pt,

## 2020-08-14 NOTE — ED Notes (Signed)
Got patient on the monitor did vitals patient is resting with call bell in reach

## 2020-08-15 ENCOUNTER — Encounter (HOSPITAL_COMMUNITY): Payer: Self-pay | Admitting: Vascular Surgery

## 2020-08-15 ENCOUNTER — Inpatient Hospital Stay (HOSPITAL_COMMUNITY): Payer: Medicare PPO

## 2020-08-15 DIAGNOSIS — D62 Acute posthemorrhagic anemia: Secondary | ICD-10-CM | POA: Diagnosis not present

## 2020-08-15 DIAGNOSIS — J9601 Acute respiratory failure with hypoxia: Secondary | ICD-10-CM | POA: Diagnosis not present

## 2020-08-15 DIAGNOSIS — R578 Other shock: Secondary | ICD-10-CM

## 2020-08-15 LAB — URINALYSIS, ROUTINE W REFLEX MICROSCOPIC
Bacteria, UA: NONE SEEN
Bilirubin Urine: NEGATIVE
Glucose, UA: 150 mg/dL — AB
Ketones, ur: 20 mg/dL — AB
Nitrite: NEGATIVE
Protein, ur: NEGATIVE mg/dL
Specific Gravity, Urine: 1.018 (ref 1.005–1.030)
pH: 6 (ref 5.0–8.0)

## 2020-08-15 LAB — BPAM RBC
Blood Product Expiration Date: 202206152359
Blood Product Expiration Date: 202206232359
ISSUE DATE / TIME: 202206011544
ISSUE DATE / TIME: 202206011544
Unit Type and Rh: 600
Unit Type and Rh: 600

## 2020-08-15 LAB — TYPE AND SCREEN
ABO/RH(D): AB NEG
Antibody Screen: POSITIVE
Donor AG Type: NEGATIVE
Unit division: 0
Unit division: 0

## 2020-08-15 LAB — GLUCOSE, CAPILLARY
Glucose-Capillary: 100 mg/dL — ABNORMAL HIGH (ref 70–99)
Glucose-Capillary: 107 mg/dL — ABNORMAL HIGH (ref 70–99)
Glucose-Capillary: 108 mg/dL — ABNORMAL HIGH (ref 70–99)
Glucose-Capillary: 134 mg/dL — ABNORMAL HIGH (ref 70–99)
Glucose-Capillary: 183 mg/dL — ABNORMAL HIGH (ref 70–99)
Glucose-Capillary: 194 mg/dL — ABNORMAL HIGH (ref 70–99)
Glucose-Capillary: 230 mg/dL — ABNORMAL HIGH (ref 70–99)

## 2020-08-15 LAB — RESP PANEL BY RT-PCR (FLU A&B, COVID) ARPGX2
Influenza A by PCR: NEGATIVE
Influenza B by PCR: NEGATIVE
SARS Coronavirus 2 by RT PCR: NEGATIVE

## 2020-08-15 LAB — POCT I-STAT 7, (LYTES, BLD GAS, ICA,H+H)
Acid-base deficit: 4 mmol/L — ABNORMAL HIGH (ref 0.0–2.0)
Bicarbonate: 21.9 mmol/L (ref 20.0–28.0)
Calcium, Ion: 1.22 mmol/L (ref 1.15–1.40)
HCT: 33 % — ABNORMAL LOW (ref 36.0–46.0)
Hemoglobin: 11.2 g/dL — ABNORMAL LOW (ref 12.0–15.0)
O2 Saturation: 100 %
Patient temperature: 96.9
Potassium: 3.5 mmol/L (ref 3.5–5.1)
Sodium: 138 mmol/L (ref 135–145)
TCO2: 23 mmol/L (ref 22–32)
pCO2 arterial: 40.9 mmHg (ref 32.0–48.0)
pH, Arterial: 7.333 — ABNORMAL LOW (ref 7.350–7.450)
pO2, Arterial: 236 mmHg — ABNORMAL HIGH (ref 83.0–108.0)

## 2020-08-15 LAB — CBC WITH DIFFERENTIAL/PLATELET
Abs Immature Granulocytes: 0.08 10*3/uL — ABNORMAL HIGH (ref 0.00–0.07)
Basophils Absolute: 0 10*3/uL (ref 0.0–0.1)
Basophils Relative: 0 %
Eosinophils Absolute: 0 10*3/uL (ref 0.0–0.5)
Eosinophils Relative: 0 %
HCT: 34.8 % — ABNORMAL LOW (ref 36.0–46.0)
Hemoglobin: 11.5 g/dL — ABNORMAL LOW (ref 12.0–15.0)
Immature Granulocytes: 1 %
Lymphocytes Relative: 4 %
Lymphs Abs: 0.5 10*3/uL — ABNORMAL LOW (ref 0.7–4.0)
MCH: 29.1 pg (ref 26.0–34.0)
MCHC: 33 g/dL (ref 30.0–36.0)
MCV: 88.1 fL (ref 80.0–100.0)
Monocytes Absolute: 0.8 10*3/uL (ref 0.1–1.0)
Monocytes Relative: 7 %
Neutro Abs: 10.6 10*3/uL — ABNORMAL HIGH (ref 1.7–7.7)
Neutrophils Relative %: 88 %
Platelets: 156 10*3/uL (ref 150–400)
RBC: 3.95 MIL/uL (ref 3.87–5.11)
RDW: 15.4 % (ref 11.5–15.5)
WBC: 12 10*3/uL — ABNORMAL HIGH (ref 4.0–10.5)
nRBC: 0 % (ref 0.0–0.2)

## 2020-08-15 LAB — COMPREHENSIVE METABOLIC PANEL
ALT: 7 U/L (ref 0–44)
AST: 18 U/L (ref 15–41)
Albumin: 3.2 g/dL — ABNORMAL LOW (ref 3.5–5.0)
Alkaline Phosphatase: 42 U/L (ref 38–126)
Anion gap: 10 (ref 5–15)
BUN: 7 mg/dL — ABNORMAL LOW (ref 8–23)
CO2: 22 mmol/L (ref 22–32)
Calcium: 8.2 mg/dL — ABNORMAL LOW (ref 8.9–10.3)
Chloride: 103 mmol/L (ref 98–111)
Creatinine, Ser: 0.79 mg/dL (ref 0.44–1.00)
GFR, Estimated: >60 mL/min (ref >60)
Glucose, Bld: 207 mg/dL — ABNORMAL HIGH (ref 70–99)
Potassium: 4.4 mmol/L (ref 3.5–5.1)
Sodium: 135 mmol/L (ref 135–145)
Total Bilirubin: 1.4 mg/dL — ABNORMAL HIGH (ref 0.3–1.2)
Total Protein: 4.9 g/dL — ABNORMAL LOW (ref 6.5–8.1)

## 2020-08-15 LAB — PROTIME-INR
INR: 1.2 (ref 0.8–1.2)
Prothrombin Time: 15.2 seconds (ref 11.4–15.2)

## 2020-08-15 LAB — HEMOGLOBIN A1C
Hgb A1c MFr Bld: 5.5 % (ref 4.8–5.6)
Mean Plasma Glucose: 111 mg/dL

## 2020-08-15 LAB — APTT: aPTT: 28 seconds (ref 24–36)

## 2020-08-15 LAB — MAGNESIUM: Magnesium: 1.7 mg/dL (ref 1.7–2.4)

## 2020-08-15 MED ORDER — CHLORHEXIDINE GLUCONATE CLOTH 2 % EX PADS
6.0000 | MEDICATED_PAD | Freq: Every day | CUTANEOUS | Status: DC
  Administered 2020-08-15 – 2020-08-18 (×4): 6 via TOPICAL

## 2020-08-15 MED ORDER — ASPIRIN 81 MG PO CHEW
81.0000 mg | CHEWABLE_TABLET | Freq: Every day | ORAL | Status: DC
  Administered 2020-08-16 – 2020-08-19 (×4): 81 mg via ORAL
  Filled 2020-08-15 (×5): qty 1

## 2020-08-15 MED ORDER — INSULIN ASPART 100 UNIT/ML IJ SOLN
0.0000 [IU] | INTRAMUSCULAR | Status: DC
  Administered 2020-08-15: 5 [IU] via SUBCUTANEOUS
  Administered 2020-08-15 – 2020-08-16 (×2): 2 [IU] via SUBCUTANEOUS
  Administered 2020-08-16 – 2020-08-17 (×3): 3 [IU] via SUBCUTANEOUS

## 2020-08-15 MED ORDER — LACTATED RINGERS IV BOLUS
500.0000 mL | Freq: Once | INTRAVENOUS | Status: AC
  Administered 2020-08-15: 500 mL via INTRAVENOUS

## 2020-08-15 MED ORDER — CHLORHEXIDINE GLUCONATE 0.12 % MT SOLN
15.0000 mL | Freq: Two times a day (BID) | OROMUCOSAL | Status: DC
  Administered 2020-08-15 – 2020-08-19 (×8): 15 mL via OROMUCOSAL
  Filled 2020-08-15 (×6): qty 15

## 2020-08-15 MED ORDER — MAGNESIUM SULFATE 2 GM/50ML IV SOLN
2.0000 g | Freq: Once | INTRAVENOUS | Status: AC
  Administered 2020-08-15: 2 g via INTRAVENOUS
  Filled 2020-08-15: qty 50

## 2020-08-15 MED ORDER — ORAL CARE MOUTH RINSE
15.0000 mL | Freq: Two times a day (BID) | OROMUCOSAL | Status: DC
  Administered 2020-08-15 – 2020-08-18 (×6): 15 mL via OROMUCOSAL

## 2020-08-15 MED ORDER — LORAZEPAM 2 MG/ML IJ SOLN
1.0000 mg | INTRAMUSCULAR | Status: DC | PRN
  Administered 2020-08-15: 1 mg via INTRAVENOUS
  Filled 2020-08-15: qty 1

## 2020-08-15 NOTE — Consult Note (Signed)
NAME:  Heather Jimenez, MRN:  [id number], DOB:  [birth year], LOS: 1 ADMISSION DATE:  08/14/2020, CONSULTATION DATE:  08/14/20 REFERRING MD:  Early, CHIEF COMPLAINT:  resp failure   History of Present Illness:  75 year old woman with hx of PVD s/p fem-fram, aortofem bypass, left femoral pseudoaneurysm, right AKA, smoker, SCC of hypopharynx post laryngectomy.  She unfortunately developed left groin bleeding profuse at home, passed out and was brought to ER.  She underwent emergent left femoral false aneurysm repair with dacron placement from old left limb of aortofem graft to profundus femoris bypass on 6/1 with Dr. Donnetta Hutching. Post-operatively she remained on the ventilator and required vasopressors.   PCCM was consulted for ICU management post operatilvey  She remains critically ill in the [facility] ICU.   Pertinent  Medical History  Tobacco dependence Throat cancer PVD Protein calorie malnutrition  Significant Hospital Events: Including procedures, antibiotic start and stop dates in addition to other pertinent events   . 6/1 admission, operation, ICU . 6/2 ETT tube displaced, removed by Dr. Shearon Stalls  Interim History / Subjective:  Tmax 98.0  824ml UOP, +4L past 24 and admit  Upon exam. Levophed at 63mcg, vaso 0.02, LR 100  Subjective exam limited due to laryngectomy. Denies chest pain, SOB, and pain  Objective   Blood pressure 112/76, pulse (!) 101, temperature (!) 96.8 F (36 C), temperature source Axillary, resp. rate 18, height 5\' 3"  (1.6 m), weight 33.8 kg, SpO2 100 %. on 100% TC weaned to 60% while in room.    Vent Mode: PRVC FiO2 (%):  [50 %-100 %] 100 % Set Rate:  [15 bmp] 15 bmp Vt Set:  [410 mL] 410 mL PEEP:  [5 cmH20] 5 cmH20 Plateau Pressure:  [9 cmH20] 9 cmH20   Intake/Output Summary (Last 24 hours) at 08/15/2020 0801 Last data filed at 08/15/2020 0700 Gross per 24 hour  Intake 5455.88 ml  Output 1080 ml  Net 4375.88 ml   Filed Weights   08/14/20 1120 08/14/20 1825  Weight:  34.9 kg 33.8 kg    Examination: General:  In bed, interactive, no acute distress HEENT: MM pink/moist, anicteric, trachea midline  Neuro: GCS 11, RASS 0, PERRL 78mm CV: S1S2, NSR on monitor, no m/r/g appreciated PULM:  clear in the upper lobes, diminished in the lower lobes, chest expansion symmetric, no blood at laryngectomy site GI: soft, bsx4 active, nontender Renal: Foley in place, urine clear, yellow Extremities: warm/dry, no pretibial edema, capillary refill less than 3 seconds  Skin: Surgical groin site c/d/i, no other lesions appreciated   Labs/imaging that I havepersonally reviewed  (right click and "Reselect all SmartList Selections" daily)  CBC- WBC 11.7 to 12, HBG 10>11.5 MG 1.7 BMP- wnl DIC panel- no schistocytes, Fibrinogen 163 CXR- LLL airspace consolidation improved from 6/1 film Resolved Hospital Problem list     Assessment & Plan:  Postop hypoxemic respiratory failure secondary to aspiration pneumonitis-Improving S/p laryngectomy ETT displaced and removed overnight. Trach collar over laryngectomy. FiO2 100>60. CXR- LLL airspace consolidation improved from 6/1 film -Goal sats 88 to 96%. Wean o2 to goal -Pulmonary hygiene. DB, cough, oob as able. Unable to do IS or flutter valve. Start bed percussion -Holding on diuresis due to vasopressor requirement. Will consider once off levophed.  Severe PVD with infected pseudoaneusym post repair Postoperative oozing Postop shock combination hemorrhage and ?septic shock Acute Blood Loss Anemia Remains on vasopressors, weaned of vaso, on levo. DIC panel negative. 2U PRBC given in OR. HGB 11.2>11.5.  WBC 11.7>12 suspect SIRS Tmax 98 -Continue levophed. Goal MAP greater than 65. Titrate to goal -Continue vanc and zosyn. Follow up on TA, intraop cultures  -Transfuse PRBC if HBG less than 7 -Obtain AM CBC to trend H&H -Continue to monitor for signs of bleeding   Repair of left femoral faluse aneurysm with graft site POD  1 -Management per vascular -DVT ppx per vascular  Hyperglycemia BG 120-280, suspect post op stress -continue SSI -Blood Glucose goal 140-180.  Best practice (right click and "Reselect all SmartList Selections" daily)  Diet:  NPO Pain/Anxiety/Delirium protocol (if indicated): No VAP protocol (if indicated): Not indicated DVT prophylaxis: SCD GI prophylaxis: PPI Glucose control:  SSI Yes Central venous access:  Yes, and it is still needed Arterial line:  Yes, and it is still needed Foley:  Yes, and it is no longer needed Mobility:  OOB  PT consulted: Yes Last date of multidisciplinary goals of care discussion [pending] Code Status:  full code Disposition: ICU   Critical care time: 31 minutes     Redmond School., MSN, APRN, AGACNP-BC [facility]  08/15/2020 , 8:01 AM  Please see [email or web address] for pager details  If no response, please call [phone] After hours, please call Elink at [phone]

## 2020-08-15 NOTE — Progress Notes (Addendum)
Called to bedside to evaluate patient for ETT cuff moving in and out of the stoma and causing cuff leak. Having copious secretions.  Breathing well spontaneously on vent with minimal settings, awake and alert.  ETT removed from stoma. Attempted to locate laryngectomy tube through OR, respiratory dept and distribution at both [facility] and [facility]. None were located.  The patient says she doesn't wear a laryngectomy tube at home. Suctioned out copious secretions which eventually became blood tinged from trauma. Chest xray shows evolving Left sided pneumonia. Patient on abx and placed on trach collar. Continue to suction as needed and continue abx.  Can always put back on the vent through stoma in the event of decompensation.   Lenice Llamas, MD [facility] [place]

## 2020-08-15 NOTE — Progress Notes (Signed)
ETT coughed out of the stoma site. ETT is moving in and out of the stoma causing frequent alarming and leaks on the ventilator. ETT was removed from laryngectomy site per MD Lenice Llamas. Patient was placed on ATC. Stoma site is open to air.

## 2020-08-15 NOTE — Progress Notes (Signed)
Updated Heather Jimenez's husband Heather Jimenez at bedside regarding her care and clinical status.  Redmond School., MSN, APRN, AGACNP-BC [facility]  08/15/2020 , 2:29 PM  Please see [email or web address] for pager details  If no response, please call [phone] After hours, please call Elink at [phone]

## 2020-08-15 NOTE — Progress Notes (Signed)
[facility] Progress Note Patient Name: Heather Jimenez DOB: Dec 01, 1945 MRN: [id number]   Date of Service  08/15/2020  HPI/Events of Note  Mg++ 1.7  eICU Interventions  Magnesium sulfate 2 gm iv bolus x 1.        Kerry Kass Derrick Tiegs 08/15/2020, 4:39 AM

## 2020-08-15 NOTE — Anesthesia Postprocedure Evaluation (Signed)
Anesthesia Post Note  Patient: Heather Jimenez  Procedure(s) Performed: Repair Left Femoral Pseudoaneurysm (Left )     Patient location during evaluation: SICU Anesthesia Type: General Level of consciousness: sedated Pain management: pain level controlled Vital Signs Assessment: post-procedure vital signs reviewed and stable Respiratory status: patient remains intubated per anesthesia plan Cardiovascular status: stable Postop Assessment: no apparent nausea or vomiting Anesthetic complications: no   No complications documented.                Effie Berkshire

## 2020-08-15 NOTE — Progress Notes (Signed)
Gabapentin acquired from [facility]. Patient was NPO but patient had MD order Dr. Tamala Julian to have PO pills/. Patient states she can only have crushed pills in apple sauce. Gabapentin capsule was opened in apple sauce, but patient refused once attempted at giving it. Gabapentin wasted in trash can. Scott RN witness at [phone].

## 2020-08-15 NOTE — Progress Notes (Addendum)
  Progress Note    08/15/2020 6:43 AM 1 Day Post-Op  Subjective:  Awake.  No pain in her foot.  Says her left groin is a little sore.  Wants to know when she can go home.   Afebrile  Gtts:   Norepi Vasopressin  Vitals:   08/15/20 0500 08/15/20 0600  BP: 111/77 109/76  Pulse: (!) 102 (!) 101  Resp: 16 19  Temp:    SpO2: 99% 100%    Physical Exam: General:  No distress Lungs:  Non labored Incisions:  Left groin clean with nylon sutures in tact.  There is some bloody drainage on the bandage that is expected.  Extremities:  Monophasic doppler signal left DP/peroneal.  Left calf and anterior compartment is soft and non tender   CBC    Component Value Date/Time   WBC 12.0 (H) 08/15/2020 0249   RBC 3.95 08/15/2020 0249   HGB 11.5 (L) 08/15/2020 0249   HCT 34.8 (L) 08/15/2020 0249   PLT 156 08/15/2020 0249   MCV 88.1 08/15/2020 0249   MCH 29.1 08/15/2020 0249   MCHC 33.0 08/15/2020 0249   RDW 15.4 08/15/2020 0249   LYMPHSABS 0.5 (L) 08/15/2020 0249   MONOABS 0.8 08/15/2020 0249   EOSABS 0.0 08/15/2020 0249   BASOSABS 0.0 08/15/2020 0249    BMET    Component Value Date/Time   NA 135 08/15/2020 0249   K 4.4 08/15/2020 0249   CL 103 08/15/2020 0249   CO2 22 08/15/2020 0249   GLUCOSE 207 (H) 08/15/2020 0249   BUN 7 (L) 08/15/2020 0249   CREATININE 0.79 08/15/2020 0249   CALCIUM 8.2 (L) 08/15/2020 0249   GFRNONAA >60 08/15/2020 0249   GFRAA >60 12/31/2015 0820    INR    Component Value Date/Time   INR 1.2 08/15/2020 0249     Intake/Output Summary (Last 24 hours) at 08/15/2020 0643 Last data filed at 08/15/2020 0600 Gross per 24 hour  Intake 5321.22 ml  Output 1080 ml  Net 4241.22 ml     Assessment/Plan:  75 y.o. female is s/p:  Repair of left femoral false aneurysm with interposition of 6 mm Dacron from old left limb of aortofemoral graft to profundus femoris bypass.  Excision of nonviable skin left groin  1 Day Post-Op   -pt with monophasic doppler  signals left DP/peroneal -acute blood loss anemia-stable at 11.5.  Received 2 units PRBC's in the OR.  There is some bloody drainage on the bandage, which is not unexpected.  There is no hematoma left groin.   -intraoperative cx taken yesterday-results are in process and pt is on vanc/zosyn pending sensitivities.  requiring pressor support-wean per primary team.  -DVT prophylaxis:  Will defer to Dr. Donnetta Hutching when it is ok to start sq heparin.   Leontine Locket, PA-C [facility] [phone] 08/15/2020 6:43 AM  I have examined the patient, reviewed and agree with above.  Looks much better today.  Extubated.  Conversant by writing notes. Left foot well-perfused with audible Doppler flow.  Obvious concern regarding left groin healing with multiple prior incisions and disrupted false aneurysm.  Continue to monitor.  Curt Jews, MD 08/15/2020 11:21 AM

## 2020-08-15 NOTE — Progress Notes (Signed)
Patients daughter called to receive updates about mothers condition. Patient was asked by RN if she granted permission for RN to update her daughter and patient nodded yes. Daughter updated on psuedoanuerysm and surery as well as pneumonia and mothers overall health status. Daughter instructed to ask primary nurse to get mothers permission when she calls for any information. Fuller Canada, RN

## 2020-08-16 DIAGNOSIS — R578 Other shock: Secondary | ICD-10-CM

## 2020-08-16 DIAGNOSIS — I729 Aneurysm of unspecified site: Secondary | ICD-10-CM

## 2020-08-16 LAB — CBC
HCT: 30.1 % — ABNORMAL LOW (ref 36.0–46.0)
Hemoglobin: 10.1 g/dL — ABNORMAL LOW (ref 12.0–15.0)
MCH: 29.4 pg (ref 26.0–34.0)
MCHC: 33.6 g/dL (ref 30.0–36.0)
MCV: 87.5 fL (ref 80.0–100.0)
Platelets: 135 10*3/uL — ABNORMAL LOW (ref 150–400)
RBC: 3.44 MIL/uL — ABNORMAL LOW (ref 3.87–5.11)
RDW: 15.8 % — ABNORMAL HIGH (ref 11.5–15.5)
WBC: 11.3 10*3/uL — ABNORMAL HIGH (ref 4.0–10.5)
nRBC: 0 % (ref 0.0–0.2)

## 2020-08-16 LAB — GLUCOSE, CAPILLARY
Glucose-Capillary: 105 mg/dL — ABNORMAL HIGH (ref 70–99)
Glucose-Capillary: 105 mg/dL — ABNORMAL HIGH (ref 70–99)
Glucose-Capillary: 112 mg/dL — ABNORMAL HIGH (ref 70–99)
Glucose-Capillary: 133 mg/dL — ABNORMAL HIGH (ref 70–99)
Glucose-Capillary: 170 mg/dL — ABNORMAL HIGH (ref 70–99)
Glucose-Capillary: 43 mg/dL — CL (ref 70–99)
Glucose-Capillary: 97 mg/dL (ref 70–99)

## 2020-08-16 LAB — BASIC METABOLIC PANEL
Anion gap: 7 (ref 5–15)
BUN: 7 mg/dL — ABNORMAL LOW (ref 8–23)
CO2: 25 mmol/L (ref 22–32)
Calcium: 8 mg/dL — ABNORMAL LOW (ref 8.9–10.3)
Chloride: 103 mmol/L (ref 98–111)
Creatinine, Ser: 0.81 mg/dL (ref 0.44–1.00)
GFR, Estimated: >60 mL/min (ref >60)
Glucose, Bld: 124 mg/dL — ABNORMAL HIGH (ref 70–99)
Potassium: 3.5 mmol/L (ref 3.5–5.1)
Sodium: 135 mmol/L (ref 135–145)

## 2020-08-16 LAB — MAGNESIUM: Magnesium: 1.8 mg/dL (ref 1.7–2.4)

## 2020-08-16 LAB — PHOSPHORUS: Phosphorus: 2.3 mg/dL — ABNORMAL LOW (ref 2.5–4.6)

## 2020-08-16 MED ORDER — SODIUM CHLORIDE 0.9 % IV BOLUS
500.0000 mL | Freq: Once | INTRAVENOUS | Status: AC
  Administered 2020-08-16: 500 mL via INTRAVENOUS

## 2020-08-16 NOTE — Progress Notes (Signed)
RT NOTE: Patient's stoma site is clean and dry. Patient says has not been coughing and no secretions noted at this time. Patient is on 35% ATC with sats of 100%. VSS. RT will continue to monitor.

## 2020-08-16 NOTE — Progress Notes (Signed)
HOSPITAL MEDICINE OVERNIGHT EVENT NOTE    Notified by nursing that patient has developed progressively worsening hypotension with systolics as low as the 24M at approximately 10 PM.  This is not associated with any symptoms whatsoever.  Patient is denying lightheadedness chest pain or shortness of breath.  Patient is awake and alert and oriented x3.  According to nursing there is no evidence of acute gross bleeding on examination.  Serosanguineous drainage noted on left groin bandage.  Chart reviewed, patient is postop day 2 status post repair of left femoral false aneurysm.  Will obtain stat CBC to ensure hemoglobin and hematocrit are stable.  Patient is receiving the second of 2 500 cc boluses ordered by the day provider.  Blood pressures seem to be improving as the second bolus is finishing.  Patient is afebrile without focal symptoms of infection.  We will continue to monitor patient closely to ensure blood pressures remained stable going forward, follow-up CBC and monitor for any evidence of acute bleeding.  Vernelle Emerald  MD [facility]

## 2020-08-16 NOTE — Evaluation (Signed)
Clinical/Bedside Swallow Evaluation Patient Details  Name: Heather Jimenez MRN: [id number] Date of Birth: 03-02-1946  Today's Date: 08/16/2020 Time: SLP Start Time (ACUTE ONLY): 1048 SLP Stop Time (ACUTE ONLY): 1057 SLP Time Calculation (min) (ACUTE ONLY): 9 min  Past Medical History:  Past Medical History:  Diagnosis Date  . Carotid bruit   . Dyslipidemia   . PAD (peripheral artery disease) ([place])   . Pneumonia   . Throat cancer ([place])   . Tobacco dependence    Past Surgical History:  Past Surgical History:  Procedure Laterality Date  . AMPUTATION Right 12/18/2014   Procedure: AMPUTATION ABOVE KNEE;  Surgeon: Elam Dutch, MD;  Location: [facility];  Service: Vascular;  Laterality: Right;  . AORTA - BILATERAL FEMORAL ARTERY BYPASS GRAFT    . COLONOSCOPY    . FALSE ANEURYSM REPAIR Left 12/30/2015   Procedure: Excision of Left FEMORAL PSEUDO-ANEURYSM;  Surgeon: Waynetta Sandy, MD;  Location: [place];  Service: Vascular;  Laterality: Left;  . FEMORAL ARTERY EXPLORATION Left 08/14/2020   Procedure: Repair Left Femoral Pseudoaneurysm;  Surgeon: Rosetta Posner, MD;  Location: [facility];  Service: Vascular;  Laterality: Left;  . FEMORAL-FEMORAL BYPASS GRAFT Left 12/30/2015   Procedure: BYPASS From Aorta-Femoral to Femoral Bypass Graft using Dacron Graft;  Surgeon: Waynetta Sandy, MD;  Location: [place];  Service: Vascular;  Laterality: Left;  . LOWER EXTREMITY ANGIOGRAM Bilateral 12/14/2014   Procedure: Lower Extremity Angiogram;  Surgeon: Elam Dutch, MD;  Location: [facility];  Service: Cardiovascular;  Laterality: Bilateral;  . PEG TUBE PLACEMENT    . PEG TUBE REMOVAL    . PERIPHERAL VASCULAR CATHETERIZATION N/A 12/14/2014   Procedure: Abdominal Aortogram;  Surgeon: Elam Dutch, MD;  Location: [facility];  Service: Cardiovascular;  Laterality: N/A;  . TONSILLECTOMY    . TOTAL LARYNGECTOMY     HPI:  Pt is a 75 yo female presenting with L groin bleeding  s/p emergent L femoral false aneurysm repair. Post-op course complicated by PNA. Pt with a h/o total laryngectomy 5 years ago due to [facility] of hypopharynx so SLP was ordered to address laryngectomy needs and swallowing. PMH also includes: PVD s/p fem-fram, aortofem bypass, left femoral pseudoaneurysm, right AKA, smoker, former PEG use during cancer tx but since removed   Assessment / Plan / Recommendation Clinical Impression  Pt has a mild, likely chronic oral dysphagia with mildly reduced mandibular ROM. As a result of this and her mapositioning in bed, she has small amounts of anterior loss from her lips that occurs fairly consistently. With purees, she has time to use her tongue get the food back into her mouth. With liquids, she allows them to continue to fall down her chin and onto her neck/chest without attempts to stop them. Her stoma faces superiorly from where it is located, and as a result, these liquids that dribble down her chin head directly toward her stoma if not wiped away. Pt was made aware of this occurring, repositioned, and given a wash cloth to self-regulate anterior loss, which appeared to keep all additional boluses away from her stoma externally. A straw was also offered, but pt said that she cannot use a straw.   Otherwise, pt's only other risk for aspiration would be from a potential fistula given that her swallowing mechanism and her airway have been surgically separated. Pt is eager to be on more solid foods, but does not have her lower dentures yet. She believes  her husband is bringing them today though, so will advance to regular solids and thin liquids with good posture during intake. SLP will f/u briefly to ensure that more solid textures are still appropriate and that pt is utilizeing strategies. SLP Visit Diagnosis: Dysphagia, oral phase (R13.11)    Aspiration Risk  Mild aspiration risk    Diet Recommendation Regular;Thin liquid   Liquid Administration via:  Cup Medication Administration: Crushed with puree (per pt preference - crushed with pudding) Supervision: Patient able to self feed Compensations: Slow rate;Small sips/bites;Monitor for anterior loss Postural Changes: Seated upright at 90 degrees    Other  Recommendations Oral Care Recommendations: Oral care BID   Follow up Recommendations None      Frequency and Duration min 1 x/week  1 week       Prognosis Prognosis for Safe Diet Advancement: Good      Swallow Study   General HPI: Pt is a 75 yo female presenting with L groin bleeding s/p emergent L femoral false aneurysm repair. Post-op course complicated by PNA. Pt with a h/o total laryngectomy 5 years ago due to [facility] of hypopharynx so SLP was ordered to address laryngectomy needs and swallowing. PMH also includes: PVD s/p fem-fram, aortofem bypass, left femoral pseudoaneurysm, right AKA, smoker, former PEG use during cancer tx but since removed Type of Study: Bedside Swallow Evaluation Previous Swallow Assessment: none in chart Diet Prior to this Study: Dysphagia 1 (puree);Thin liquids Temperature Spikes Noted: No Respiratory Status: Room air History of Recent Intubation:  (via stoma) Behavior/Cognition: Alert;Cooperative;Pleasant mood Oral Cavity Assessment: Within Functional Limits Oral Care Completed by SLP: No Oral Cavity - Dentition: Dentures, top;Dentures, not available (pt says her husband will be bringing her lower dentures today) Vision: Functional for self-feeding Self-Feeding Abilities: Able to feed self Patient Positioning: Upright in bed Baseline Vocal Quality: Aphonic (secondary to TL)    Oral/Motor/Sensory Function Overall Oral Motor/Sensory Function: Within functional limits   Ice Chips Ice chips: Not tested   Thin Liquid Thin Liquid: Impaired Presentation: Cup;Self Fed Oral Phase Impairments: Other (comment) (reduced mandibular ROM) Oral Phase Functional Implications: Left anterior spillage;Right anterior  spillage    Nectar Thick Nectar Thick Liquid: Not tested   Honey Thick Honey Thick Liquid: Not tested   Puree Puree: Impaired Presentation: Spoon Oral Phase Impairments: Other (comment) (reduced mandibular ROM) Oral Phase Functional Implications: Left anterior spillage;Right anterior spillage   Solid     Solid: Impaired Presentation: Self Fed Oral Phase Impairments: Impaired mastication;Other (comment) (reduced mandibular ROM)       Osie Bond., M.A. [place] Pager [phone] Office [phone] 08/16/2020,1:41 PM

## 2020-08-16 NOTE — Evaluation (Signed)
Physical Therapy Evaluation Patient Details Name: Heather Jimenez MRN: [id number] DOB: Mar 14, 1946 Today's Date: 08/16/2020   History of Present Illness  Pt is a 75 y/o female admitted 6/1 secondary to L femoral pseudoaneurysm bleed. Pt is s/p repair on 6/1. PMH includes R AKA, smoker, PVD s/p fem-fram, aortofem bypass, left femoral pseudoaneurysm, throat cancer s/p laryngectomy (still has stoma).  Clinical Impression  Pt admitted secondary to problem above with deficits below. Pt requiring min A to roll for repositioning this session. HR elevating to 138 just with bed mobility tasks and sustaining in upper 120s to low 130s. RN notified. Oxygen sats stable using oxygen trach mask collar. Pt was previously mod I with use of WC and was able to transfer independently prior to admission. Will need to progress mobility further to determine most appropriate d/c recommendations; pt would like to return home if possible. Will continue to follow acutely to maximize functional mobility independence and safety.     Follow Up Recommendations Other (comment) (TBD pending progression)    Equipment Recommendations  Other (comment) (TBD pending progression)    Recommendations for Other Services       Precautions / Restrictions Precautions Precautions: Fall Precaution Comments: Watch HR Required Braces or Orthoses: Other Brace Other Brace: has prosthetic Restrictions Weight Bearing Restrictions: No      Mobility  Bed Mobility Overal bed mobility: Needs Assistance Bed Mobility: Rolling Rolling: Min assist         General bed mobility comments: Pt with elevated HR and RN wanted to keep at bed level. Required min A for rolling for pad placement and pillow placement under R hip. HR elevating to 138 with movement.    Transfers                    Ambulation/Gait                Stairs            Wheelchair Mobility    Modified Rankin (Stroke Patients Only)        Balance                                             Pertinent Vitals/Pain Pain Assessment: Faces Faces Pain Scale: Hurts little more Pain Location: L incision site Pain Descriptors / Indicators: Grimacing;Guarding Pain Intervention(s): Limited activity within patient's tolerance;Monitored during session;Repositioned    Home Living Family/patient expects to be discharged to:: Private residence Living Arrangements: Spouse/significant other Available Help at Discharge: Family Type of Home: House Home Access: Ramped entrance     Home Layout: One level Home Equipment: Wheelchair - power;Wheelchair - manual      Prior Function Level of Independence: Needs assistance   Gait / Transfers Assistance Needed: Is independent with transfers to/from [facility]  ADL's / Homemaking Assistance Needed: Reports family assists with ADL tasks.        Hand Dominance        Extremity/Trunk Assessment   Upper Extremity Assessment Upper Extremity Assessment: Defer to OT evaluation    Lower Extremity Assessment Lower Extremity Assessment: RLE deficits/detail;LLE deficits/detail RLE Deficits / Details: R AKA at baseline LLE Deficits / Details: Limited ROM at hip secondary to incisional pain       Communication   Communication: Tracheostomy (has stoma; communicates through whiteboard.)  Cognition Arousal/Alertness: Awake/alert Behavior During Therapy: [facility] for  tasks assessed/performed Overall Cognitive Status: Within Functional Limits for tasks assessed                                        General Comments      Exercises     Assessment/Plan    PT Assessment Patient needs continued PT services  PT Problem List Decreased strength;Decreased range of motion;Decreased activity tolerance;Decreased balance;Decreased mobility;Decreased knowledge of use of DME;Decreased knowledge of precautions;Pain       PT Treatment Interventions DME  instruction;Functional mobility training;Therapeutic activities;Therapeutic exercise;Balance training;Patient/family education;Wheelchair mobility training    PT Goals (Current goals can be found in the Care Plan section)  Acute Rehab PT Goals Patient Stated Goal: to go home PT Goal Formulation: With patient/family Time For Goal Achievement: 08/30/20 Potential to Achieve Goals: Good    Frequency Min 3X/week   Barriers to discharge        Co-evaluation               AM-PAC PT "6 Clicks" Mobility  Outcome Measure Help needed turning from your back to your side while in a flat bed without using bedrails?: A Little Help needed moving from lying on your back to sitting on the side of a flat bed without using bedrails?: A Little Help needed moving to and from a bed to a chair (including a wheelchair)?: A Lot Help needed standing up from a chair using your arms (e.g., wheelchair or bedside chair)?: A Lot Help needed to walk in hospital room?: Total Help needed climbing 3-5 steps with a railing? : Total 6 Click Score: 12    End of Session   Activity Tolerance: Treatment limited secondary to medical complications (Comment) (elevated HR) Patient left: in bed;with call bell/phone within reach;with bed alarm set;with family/visitor present Nurse Communication: Mobility status PT Visit Diagnosis: Unsteadiness on feet (R26.81);Other abnormalities of gait and mobility (R26.89);Difficulty in walking, not elsewhere classified (R26.2);Pain Pain - Right/Left: Left Pain - part of body:  (groin)    Time: 1157-2620 PT Time Calculation (min) (ACUTE ONLY): 19 min   Charges:   PT Evaluation $PT Eval Moderate Complexity: 1 Mod          Reuel Derby, PT, DPT  Acute Rehabilitation Services  Pager: [phone] Office: [phone]   Rudean Hitt 08/16/2020, 4:18 PM

## 2020-08-16 NOTE — Progress Notes (Addendum)
  Progress Note    08/16/2020 7:02 AM 2 Days Post-Op  Subjective:  Says her left groin is throbbing.  No pain in left foot.  Says breakfast is not good.   Tm 99.3 now afebrile HR 90's-130's ST 49'Z-791'T systolic 056%.40 TC  Vitals:   08/16/20 0246 08/16/20 0424  BP:  93/62  Pulse: (!) 110 (!) 118  Resp: 20 16  Temp:  98.7 F (37.1 C)  SpO2: 98% 93%    Physical Exam: General:  No distress; sitting up in bed eating breakfast Lungs:  Non labored Incisions:  Left groin looks good with mild serous drainage on bandage. Extremities:  Bilateral feet are warm and well perfused.    CBC    Component Value Date/Time   WBC 11.3 (H) 08/16/2020 0222   RBC 3.44 (L) 08/16/2020 0222   HGB 10.1 (L) 08/16/2020 0222   HCT 30.1 (L) 08/16/2020 0222   PLT 135 (L) 08/16/2020 0222   MCV 87.5 08/16/2020 0222   MCH 29.4 08/16/2020 0222   MCHC 33.6 08/16/2020 0222   RDW 15.8 (H) 08/16/2020 0222   LYMPHSABS 0.5 (L) 08/15/2020 0249   MONOABS 0.8 08/15/2020 0249   EOSABS 0.0 08/15/2020 0249   BASOSABS 0.0 08/15/2020 0249    BMET    Component Value Date/Time   NA 135 08/16/2020 0222   K 3.5 08/16/2020 0222   CL 103 08/16/2020 0222   CO2 25 08/16/2020 0222   GLUCOSE 124 (H) 08/16/2020 0222   BUN 7 (L) 08/16/2020 0222   CREATININE 0.81 08/16/2020 0222   CALCIUM 8.0 (L) 08/16/2020 0222   GFRNONAA >60 08/16/2020 0222   GFRAA >60 12/31/2015 0820    INR    Component Value Date/Time   INR 1.2 08/15/2020 0249     Intake/Output Summary (Last 24 hours) at 08/16/2020 0702 Last data filed at 08/15/2020 1830 Gross per 24 hour  Intake 1136.55 ml  Output 225 ml  Net 911.55 ml   Intraop wound Cx 08/14/2020: No growth x 24 hrs.   Assessment/Plan:  75 y.o. female is s/p:  Repair of left femoral false aneurysm with interposition of 6 mm PTFE from old left limb of aortofemoral graft to profundus femoris bypass. Excision of nonviable skin left groin  2 Days Post-Op   -intra op cx no  growth to date.  Would continue broad spectrum IV abx at this time.  -left groin looks good with nylon sutures in tact with serous drainage on bandage.   Concern for wound healing given multiple operations and disrupted false aneurysm.    Leontine Locket, PA-C [facility] [phone] 08/16/2020 7:02 AM   I have examined the patient, reviewed and agree with above.  Patient's continues to do well regarding her left femoral false aneurysm.  Her preoperative CT scan did show new saccular outpouchings around her aortic graft in her intra-abdominal area.  Clearly is not a candidate for explant of her graft due to multiple other comorbidities.  Gust this with the patient and her husband.  Continued no growth from fluid in the left groin  Curt Jews, MD 08/16/2020 10:09 AM

## 2020-08-16 NOTE — Progress Notes (Signed)
PROGRESS NOTE    Heather Jimenez  WYO:[id number] DOB: 09/16/1945 DOA: 08/14/2020 PCP: Cher Nakai, MD   Brief Narrative:  75 year old woman with hx of PVD s/p fem-fram, aortofem bypass, left femoral pseudoaneurysm, right AKA, smoker, SCC of hypopharynx post laryngectomy.  She unfortunately developed left groin bleeding profuse at home, passed out and was brought to ER.  She underwent emergent left femoral false aneurysm repair with dacron placement from old left limb of aortofem graft to profundus femoris bypass on 6/1 with Dr. Donnetta Hutching. Post-operatively she remained on the ventilator and required vasopressors. PCCM was consulted for ICU management post operatively.  Patient transition to hospitalist team given resolution of hypoxia and control of bleeding.   Assessment & Plan:   Active Problems:   Femoral artery aneurysm (HCC)   Aneurysm (HCC)   Hemorrhagic shock (HCC)   Acute respiratory failure with hypoxia (HCC)   Acute blood loss anemia  Acute postop hypoxemic respiratory failure secondary to aspiration pneumonitis versus pneumonia-Improving S/p laryngectomy ETT displaced and removed overnight 08/14/20. Trach collar over laryngectomy. FiO2 100>60 -weaning down to room air today which is her baseline - Pulmonary hygiene. DB, cough, oob as able. Unable to do IS or flutter valve. Start bed percussion -Continue to hold diuresis today, likely to reinitiate in the next 24 to 48 hours pending clinical course  Severe PVD with infected pseudoaneusym status post repair Postoperative oozing Postop shock combination hemorrhage and ?septic shock Acute Blood Loss Anemia -Weaned off pressors more than 24 hours ago  -DIC panel negative  -Unclear if shock is septic in nature or due to volume loss/bleeding and anesthesia/analgesics -Remains on Vanco and Zosyn, likely to de-escalate in the next 24 hours if she remains afebrile without worsening leukocytosis or signs or symptoms of infection.   -Continue  to follow cultures -Transfuse PRBC if HBG less than 7  Repair of left femoral faluse aneurysm with graft site -Procedure 08/14/2020 -Management per vascular surgery -DVT ppx per vascular surgery  Hyperglycemia without history of diabetes, A1c 5.5 Likely postop/acute phase reactant/stress-induced Continue to follow [facility] S and morning labs -currently well controlled   DVT prophylaxis: Aspirin 81 mg only given above Code Status: Full Family Communication: None present  Status is: Inpatient  Dispo: The patient is from: Home              Anticipated d/c is to: To be determined              Anticipated d/c date is: 48 to 72 hours              Patient currently not medically stable for discharge  Consultants:   Vascular surgery, PCCM  Procedures:   6/1 vascular surgery  6/2 ETT tube displaced  Antimicrobials:  Vanco Zosyn, ongoing  Subjective: No acute issues or events overnight denies nausea vomiting diarrhea constipation headache fevers or chills.  Somewhat difficult conversation this morning as patient did not bring her voicebox but we were able to communicate on whiteboard.  Objective: Vitals:   08/15/20 2333 08/15/20 2347 08/16/20 0246 08/16/20 0424  BP:  99/66  93/62  Pulse: (!) 119 (!) 113 (!) 110 (!) 118  Resp: (!) 22 20 20 16   Temp:  98.6 F (37 C)  98.7 F (37.1 C)  TempSrc:  Oral  Oral  SpO2: 96% 99% 98% 93%  Weight:      Height:        Intake/Output Summary (Last 24 hours) at 08/16/2020 0741  Last data filed at 08/15/2020 1830 Gross per 24 hour  Intake 1136.55 ml  Output 225 ml  Net 911.55 ml   Filed Weights   08/14/20 1120 08/14/20 1825  Weight: 34.9 kg 33.8 kg    Examination:  General:  Awake alert oriented no acute distress, resting comfortably in bed HEENT: MM pink/moist, anicteric, laryngectomy site open without purulence or bleeding Neuro: Without overt deficits CV: S1S2,  regular rate and rhythm no murmurs rubs or gallops PULM:   Clear to  auscultate bilaterally without overt wheezes rales or rhonchi GI: soft, bsx4 active, nontender Renal: Foley in place, urine clear, yellow Extremities: Strength 5 out of 5 globally without overt deficit Skin: Surgical groin dressing clean dry intact   Data Reviewed: I have personally reviewed following labs and imaging studies  CBC: Recent Labs  Lab 08/14/20 1123 08/14/20 1213 08/14/20 1635 08/14/20 1759 08/14/20 1844 08/14/20 1902 08/15/20 0249 08/16/20 0222  WBC 17.1*  --   --   --  11.7*  --  12.0* 11.3*  NEUTROABS 14.7*  --   --   --   --   --  10.6*  --   HGB 9.6*   < > 11.9* 10.5* 11.2*  11.2*  --  11.5* 10.1*  HCT 30.9*   < > 35.0* 31.0* 33.0*  33.8*  --  34.8* 30.1*  MCV 91.2  --   --   --  88.7  --  88.1 87.5  PLT 232  --   --   --  138* 144* 156 135*   < > = values in this interval not displayed.   Basic Metabolic Panel: Recent Labs  Lab 08/14/20 1123 08/14/20 1213 08/14/20 1532 08/14/20 1635 08/14/20 1759 08/14/20 1844 08/15/20 0249 08/16/20 0222  NA 133* 134*   < > 137 139 138  136 135 135  K 3.4* 3.4*   < > 4.5 3.5 3.5  3.3* 4.4 3.5  CL 101 102  --   --   --  99 103 103  CO2 16*  --   --   --   --  19* 22 25  GLUCOSE 226* 225*  --   --   --  192* 207* 124*  BUN 8 8  --   --   --  6* 7* 7*  CREATININE 0.87 0.60  --   --   --  0.68 0.79 0.81  CALCIUM 7.6*  --   --   --   --  8.3* 8.2* 8.0*  MG  --   --   --   --   --  1.3* 1.7 1.8  PHOS  --   --   --   --   --  5.0*  --  2.3*   < > = values in this interval not displayed.   GFR: Estimated Creatinine Clearance: 32 mL/min (by C-G formula based on SCr of 0.81 mg/dL). Liver Function Tests: Recent Labs  Lab 08/15/20 0249  AST 18  ALT 7  ALKPHOS 42  BILITOT 1.4*  PROT 4.9*  ALBUMIN 3.2*   No results for input(s): LIPASE, AMYLASE in the last 168 hours. No results for input(s): AMMONIA in the last 168 hours. Coagulation Profile: Recent Labs  Lab 08/14/20 1123 08/14/20 1902 08/15/20 0249   INR 1.1 1.4* 1.2   Cardiac Enzymes: No results for input(s): CKTOTAL, CKMB, CKMBINDEX, TROPONINI in the last 168 hours. BNP (last 3 results) No results for input(s): PROBNP in the  last 8760 hours. HbA1C: Recent Labs    08/14/20 1844  HGBA1C 5.5   CBG: Recent Labs  Lab 08/15/20 1116 08/15/20 1615 08/15/20 1955 08/15/20 2349 08/16/20 0427  GLUCAP 134* 108* 107* 100* 112*   Lipid Profile: No results for input(s): CHOL, HDL, LDLCALC, TRIG, CHOLHDL, LDLDIRECT in the last 72 hours. Thyroid Function Tests: No results for input(s): TSH, T4TOTAL, FREET4, T3FREE, THYROIDAB in the last 72 hours. Anemia Panel: No results for input(s): VITAMINB12, FOLATE, FERRITIN, TIBC, IRON, RETICCTPCT in the last 72 hours. Sepsis Labs: No results for input(s): PROCALCITON, LATICACIDVEN in the last 168 hours.  Recent Results (from the past 240 hour(s))  Aerobic/Anaerobic Culture w Gram Stain (surgical/deep wound)     Status: None (Preliminary result)   Collection Time: 08/14/20  4:01 PM   Specimen: PATH Soft tissue  Result Value Ref Range Status   Specimen Description TISSUE LEFT FEMORAL PERIGRAFT SPEC A  Final   Special Requests LEFT FEMORAL PERIGRAFT  Final   Gram Stain   Final    RARE WBC PRESENT, PREDOMINANTLY MONONUCLEAR NO ORGANISMS SEEN    Culture   Final    NO GROWTH < 24 HOURS Performed at [facility], [place] [address] [place], [place] [address]    Report Status PENDING  Incomplete  MRSA PCR Screening     Status: None   Collection Time: 08/14/20  6:44 PM   Specimen: Nasopharyngeal  Result Value Ref Range Status   MRSA by PCR NEGATIVE NEGATIVE Final    Comment:        The GeneXpert MRSA Assay (FDA approved for NASAL specimens only), is one component of a comprehensive MRSA colonization surveillance program. It is not intended to diagnose MRSA infection nor to guide or monitor treatment for MRSA infections. Performed at [facility], [place] [address],  [place], [place] [address]   Culture, blood (routine x 2)     Status: None (Preliminary result)   Collection Time: 08/14/20  7:11 PM   Specimen: BLOOD  Result Value Ref Range Status   Specimen Description BLOOD SITE NOT SPECIFIED  Final   Special Requests   Final    BOTTLES DRAWN AEROBIC AND ANAEROBIC Blood Culture results may not be optimal due to an inadequate volume of blood received in culture bottles   Culture   Final    NO GROWTH 1 DAY Performed at [facility], [place] [address] [place], [place] [address]    Report Status PENDING  Incomplete  Culture, blood (routine x 2)     Status: None (Preliminary result)   Collection Time: 08/14/20  7:50 PM   Specimen: BLOOD  Result Value Ref Range Status   Specimen Description BLOOD LEFT HAND  Final   Special Requests   Final    BOTTLES DRAWN AEROBIC ONLY Blood Culture results may not be optimal due to an inadequate volume of blood received in culture bottles   Culture   Final    NO GROWTH 1 DAY Performed at [facility], [place] [address], [place], [place] [address]    Report Status PENDING  Incomplete  Anaerobic culture w Gram Stain     Status: None (Preliminary result)   Collection Time: 08/14/20  8:51 PM   Specimen: PATH Soft tissue  Result Value Ref Range Status   Specimen Description TRACHEAL ASPIRATE  Final   Special Requests NONE  Final   Gram Stain   Final    RARE WBC PRESENT,BOTH  PMN AND MONONUCLEAR NO ORGANISMS SEEN Performed at [facility], [place] [address] [place], [place] [address]    Culture PENDING  Incomplete   Report Status PENDING  Incomplete  Culture, Respiratory w Gram Stain     Status: None (Preliminary result)   Collection Time: 08/14/20  8:51 PM   Specimen: Tracheal Aspirate; Respiratory  Result Value Ref Range Status   Specimen Description TRACHEAL ASPIRATE  Final   Special Requests NONE  Final   Gram Stain   Final    RARE WBC PRESENT, PREDOMINANTLY PMN NO ORGANISMS SEEN Performed at [facility], [place] [address] [place], [place] [address]    Culture PENDING  Incomplete   Report Status PENDING  Incomplete  Resp Panel by RT-PCR (Flu A&B, Covid) Nasopharyngeal Swab     Status: None   Collection Time: 08/15/20  9:14 AM   Specimen: Nasopharyngeal Swab; Nasopharyngeal(NP) swabs in vial transport medium  Result Value Ref Range Status   SARS Coronavirus 2 by RT PCR NEGATIVE NEGATIVE Final    Comment: (NOTE) SARS-CoV-2 target nucleic acids are NOT DETECTED.  The SARS-CoV-2 RNA is generally detectable in upper respiratory specimens during the acute phase of infection. The lowest concentration of SARS-CoV-2 viral copies this assay can detect is 138 copies/mL. A negative result does not preclude SARS-Cov-2 infection and should not be used as the sole basis for treatment or other patient management decisions. A negative result may occur with  improper specimen collection/handling, submission of specimen other than nasopharyngeal swab, presence of viral mutation(s) within the areas targeted by this assay, and inadequate number of viral copies(<138 copies/mL). A negative result must be combined with clinical observations, patient history, and epidemiological information. The expected result is Negative.  Fact Sheet for Patients:  EntrepreneurPulse.com.au  Fact Sheet for Healthcare Providers:  IncredibleEmployment.be  This test is no t yet approved or cleared by the [place] FDA and  has been authorized for detection and/or diagnosis of SARS-CoV-2 by FDA under an Emergency Use Authorization (EUA). This EUA will remain  in effect (meaning this test can be used) for the duration of the COVID-19 declaration under Section 564(b)(1) of the Act, 21 U.S.C.section 360bbb-3(b)(1), unless the authorization is terminated  or revoked sooner.       Influenza A by PCR NEGATIVE NEGATIVE Final   Influenza B by PCR NEGATIVE NEGATIVE Final     Comment: (NOTE) The Xpert Xpress SARS-CoV-2/FLU/RSV plus assay is intended as an aid in the diagnosis of influenza from Nasopharyngeal swab specimens and should not be used as a sole basis for treatment. Nasal washings and aspirates are unacceptable for Xpert Xpress SARS-CoV-2/FLU/RSV testing.  Fact Sheet for Patients: EntrepreneurPulse.com.au  Fact Sheet for Healthcare Providers: IncredibleEmployment.be  This test is not yet approved or cleared by the [place] FDA and has been authorized for detection and/or diagnosis of SARS-CoV-2 by FDA under an Emergency Use Authorization (EUA). This EUA will remain in effect (meaning this test can be used) for the duration of the COVID-19 declaration under Section 564(b)(1) of the Act, 21 U.S.C. section 360bbb-3(b)(1), unless the authorization is terminated or revoked.  Performed at [facility], [place] [address], [place], [place] [address]          Radiology Studies: CT ABDOMEN PELVIS W CONTRAST  Result Date: 08/14/2020 CLINICAL DATA:  Left lower quadrant abdominal pain. EXAM: CT ABDOMEN AND PELVIS WITH CONTRAST TECHNIQUE: Multidetector CT imaging of the abdomen and pelvis was performed using  the standard protocol following bolus administration of intravenous contrast. CONTRAST:  65mL OMNIPAQUE IOHEXOL 300 MG/ML  SOLN COMPARISON:  December 29, 2015. FINDINGS: Lower chest: No acute abnormality. Hepatobiliary: No focal liver abnormality is seen. No gallstones, gallbladder wall thickening, or biliary dilatation. Pancreas: Unremarkable. No pancreatic ductal dilatation or surrounding inflammatory changes. Spleen: Normal in size without focal abnormality. Adrenals/Urinary Tract: Adrenal glands appear normal. Left renal atrophy is noted. No hydronephrosis or renal obstruction is noted. Urinary bladder is unremarkable. Stomach/Bowel: Stomach is within normal limits. Appendix appears normal. No evidence of bowel  wall thickening, distention, or inflammatory changes. Vascular/Lymphatic: Status post aortobifemoral graft placement. There is occlusion of the right limb of the graft as well as occlusion of the right common, internal and external iliac arteries. Left common femoral artery pseudoaneurysm is again noted measuring 4 x 3 cm which is significantly smaller compared to prior exam. No significant adenopathy is noted. There is interval development of what appears to be 3.8 x 2.5 cm saccular aneurysm formation involving the excluded aneurysmal sac at the level of the aortic bifurcation. It is not seen to fill with contrast. Reproductive: Uterus and bilateral adnexa are unremarkable. Other: No abdominal wall hernia or abnormality. No abdominopelvic ascites. Musculoskeletal: No acute or significant osseous findings. IMPRESSION: Left renal atrophy. Status post aortobifemoral graft placement. Occlusion of the right limb of the graft is noted as well as chronic occlusion of the right common, internal and external iliac arteries. There is interval development of 3.8 x 2.5 cm probable saccular aneurysm involving the excluded aneurysmal sac at the level of aortic bifurcation. It is not seen to fill with contrast. Left common femoral artery pseudoaneurysm noted on prior exam is significantly smaller currently, measuring 4 x 3 cm. Electronically Signed   By: Marijo Conception M.D.   On: 08/14/2020 13:40   DG CHEST PORT 1 VIEW  Result Date: 08/15/2020 CLINICAL DATA:  75 year old female status post surgery. Follow-up study. EXAM: PORTABLE CHEST 1 VIEW COMPARISON:  Chest x-ray 08/14/2020. FINDINGS: There is a right-sided internal jugular central venous catheter with tip terminating in the mid superior vena cava. Extensive airspace consolidation noted throughout the left mid to lower lung. Small to moderate left and small right pleural effusions. Basilar opacities on the right may reflect additional areas of atelectasis and/or  consolidation. No pneumothorax. No evidence of pulmonary edema. Heart size is normal. Mediastinal contours are distorted by patient's rotation to the left. Atherosclerosis in the thoracic aorta. Surgical clips project over the left axillary region. IMPRESSION: 1. Extensive airspace consolidation throughout the left mid to lower lung compatible with pneumonia. Additional areas of developing atelectasis and/or consolidation in the right lung base now noted. Increasing small to moderate left and small right pleural effusions. 2. Aortic atherosclerosis. Electronically Signed   By: Vinnie Langton M.D.   On: 08/15/2020 05:19   DG CHEST PORT 1 VIEW  Result Date: 08/14/2020 CLINICAL DATA:  Central line placement EXAM: PORTABLE CHEST 1 VIEW COMPARISON:  08/14/2020 FINDINGS: There is now a large bore catheter from a left approach with tip projecting in the lower SVC. Right IJ central venous catheter tip projects in the mid SVC. There are new opacities in the left lung, likely pulmonary edema. There is now mild interstitial opacity within the right lung. No pneumothorax. IMPRESSION: Right IJ approach central venous catheter tip in the mid SVC. Left approach large bore catheter tip projects in the lower SVC. No pneumothorax. Electronically Signed   By: Ulyses Jarred  M.D.   On: 08/14/2020 19:03   DG Chest Port 1 View  Result Date: 08/14/2020 CLINICAL DATA:  75 year old female with history of shortness of breath. EXAM: PORTABLE CHEST 1 VIEW COMPARISON:  Chest x-ray 12/30/2015. FINDINGS: Lung volumes are normal. No consolidative airspace disease. No pleural effusions. No pneumothorax. Chronic interstitial prominence and architectural distortion in the medial aspect of the apex of the right upper lobe, similar to numerous prior studies, most compatible with chronic post infectious or inflammatory scarring. No evidence of pulmonary edema. Heart size is normal. Upper mediastinal contours are within normal limits. Aortic  atherosclerosis. Surgical clips project over the apex of the left hemithorax and the left axillary region. IMPRESSION: 1. No radiographic evidence of acute cardiopulmonary disease. 2. Aortic atherosclerosis. 3. Probable chronic post infectious or inflammatory scarring in the apex of the right upper lobe, as above. Electronically Signed   By: Vinnie Langton M.D.   On: 08/14/2020 12:01        Scheduled Meds: . aspirin  81 mg Oral Daily  . chlorhexidine  15 mL Mouth Rinse BID  . Chlorhexidine Gluconate Cloth  6 each Topical Daily  . feeding supplement  237 mL Oral BID BM  . gabapentin  400 mg Oral TID  . insulin aspart  0-15 Units Subcutaneous Q4H  . mouth rinse  15 mL Mouth Rinse q12n4p  . nicotine  21 mg Transdermal Q24H  . pantoprazole (PROTONIX) IV  40 mg Intravenous QPM  . potassium chloride  40 mEq Oral Once   Continuous Infusions: . sodium chloride    . sodium chloride Stopped (08/14/20 1958)  . lactated ringers 100 mL/hr at 08/15/20 1800  . piperacillin-tazobactam (ZOSYN)  IV 3.375 g (08/16/20 0256)  . vancomycin Stopped (08/14/20 2150)     LOS: 2 days   Time spent: 62min  Faustine Tates C Makaylynn Bonillas, DO [facility]  If 7PM-7AM, please contact night-coverage [email or web address]  08/16/2020, 7:41 AM

## 2020-08-16 NOTE — Evaluation (Signed)
Speech Language Pathology Evaluation Patient Details Name: Heather Jimenez MRN: [id number] DOB: [birth year] Today's Date: 08/16/2020 Time: 4782-9562 SLP Time Calculation (min) (ACUTE ONLY): 9 min  Problem List:  Patient Active Problem List   Diagnosis Date Noted  . Hemorrhagic shock ([place])   . Acute respiratory failure with hypoxia ([place])   . Acute blood loss anemia   . Aneurysm ([place]) 08/14/2020  . Aftercare following surgery of the circulatory system 01/17/2016  . S/P bypass graft of extremity 01/17/2016  . Femoral artery aneurysm ([place]) 12/29/2015  . PAOD (peripheral arterial occlusive disease) ([place]) 01/05/2011  . Hypercholesterolemia 01/05/2011  . CAD (coronary artery disease) 01/05/2011  . Tobacco abuse 01/05/2011   Past Medical History:  Past Medical History:  Diagnosis Date  . Carotid bruit   . Dyslipidemia   . PAD (peripheral artery disease) ([place])   . Pneumonia   . Throat cancer ([place])   . Tobacco dependence    Past Surgical History:  Past Surgical History:  Procedure Laterality Date  . AMPUTATION Right 12/18/2014   Procedure: AMPUTATION ABOVE KNEE;  Surgeon: Elam Dutch, MD;  Location: [facility];  Service: Vascular;  Laterality: Right;  . AORTA - BILATERAL FEMORAL ARTERY BYPASS GRAFT    . COLONOSCOPY    . FALSE ANEURYSM REPAIR Left 12/30/2015   Procedure: Excision of Left FEMORAL PSEUDO-ANEURYSM;  Surgeon: Waynetta Sandy, MD;  Location: [facility];  Service: Vascular;  Laterality: Left;  . FEMORAL ARTERY EXPLORATION Left 08/14/2020   Procedure: Repair Left Femoral Pseudoaneurysm;  Surgeon: Rosetta Posner, MD;  Location: [facility];  Service: Vascular;  Laterality: Left;  . FEMORAL-FEMORAL BYPASS GRAFT Left 12/30/2015   Procedure: BYPASS From Aorta-Femoral to Femoral Bypass Graft using Dacron Graft;  Surgeon: Waynetta Sandy, MD;  Location: [facility];  Service: Vascular;  Laterality: Left;  . LOWER EXTREMITY ANGIOGRAM Bilateral 12/14/2014   Procedure: Lower Extremity  Angiogram;  Surgeon: Elam Dutch, MD;  Location: [facility];  Service: Cardiovascular;  Laterality: Bilateral;  . PEG TUBE PLACEMENT    . PEG TUBE REMOVAL    . PERIPHERAL VASCULAR CATHETERIZATION N/A 12/14/2014   Procedure: Abdominal Aortogram;  Surgeon: Elam Dutch, MD;  Location: [facility];  Service: Cardiovascular;  Laterality: N/A;  . TONSILLECTOMY    . TOTAL LARYNGECTOMY     HPI:  Pt is a 75 yo female presenting with L groin bleeding s/p emergent L femoral false aneurysm repair. Post-op course complicated by PNA. Pt with a h/o total laryngectomy 5 years ago due to [facility] of hypopharynx so SLP was ordered to address laryngectomy needs and swallowing. 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 She typically uses an Ultra Voice device, but she had sent it off for repairs some time ago and is awaiting its return. In the interim she does not use an electrolarynx or TEP (and does not want to). Tried to discuss stoma care with pt as well, for which she uses a light cloth to cover her stoma. She is also not interested in HMEs. Pt is currently communicating via a combination of writing (has white board), gesturing, and mouthing. She has been using this at home too and feels confident in her ability to get her means across. Pt declines  any further SLP needs in regards to communication or stoma care at this time. Please refer to swallowing evaluation as well for ongoing needs.    SLP Assessment  SLP Recommendation/Assessment: Patient does not need any further Speech Lanaguage Pathology Services SLP Visit Diagnosis: Aphonia (R49.1)    Follow Up Recommendations  None    Frequency and Duration           SLP Evaluation Cognition  Overall  Cognitive Status: Within Functional Limits for tasks assessed       Comprehension  Auditory Comprehension Overall Auditory Comprehension: Appears within functional limits for tasks assessed    Expression Expression Primary Mode of Expression: Nonverbal - written Verbal Expression Overall Verbal Expression:  (verbal expression WFL but use alternatively due to aphonia secondary to TL) Written Expression Written Expression: Within Functional Limits   Oral / Motor  Motor Speech Overall Motor Speech: Impaired at baseline (aphonia secondary to TL)   GO                     Osie Bond., M.A. [facility] Pager [phone] Office [phone]  08/16/2020, 12:08 PM

## 2020-08-17 LAB — GLUCOSE, CAPILLARY
Glucose-Capillary: 95 mg/dL (ref 70–99)
Glucose-Capillary: 186 mg/dL — ABNORMAL HIGH (ref 70–99)
Glucose-Capillary: 178 mg/dL — ABNORMAL HIGH (ref 70–99)
Glucose-Capillary: 52 mg/dL — ABNORMAL LOW (ref 70–99)
Glucose-Capillary: 52 mg/dL — ABNORMAL LOW (ref 70–99)
Glucose-Capillary: 124 mg/dL — ABNORMAL HIGH (ref 70–99)
Glucose-Capillary: 114 mg/dL — ABNORMAL HIGH (ref 70–99)

## 2020-08-17 LAB — CBC
HCT: 32.9 % — ABNORMAL LOW (ref 36.0–46.0)
Hemoglobin: 10.8 g/dL — ABNORMAL LOW (ref 12.0–15.0)
MCH: 29.7 pg (ref 26.0–34.0)
MCHC: 32.8 g/dL (ref 30.0–36.0)
MCV: 90.4 fL (ref 80.0–100.0)
Platelets: 140 10*3/uL — ABNORMAL LOW (ref 150–400)
RBC: 3.64 MIL/uL — ABNORMAL LOW (ref 3.87–5.11)
RDW: 15.9 % — ABNORMAL HIGH (ref 11.5–15.5)
WBC: 9.7 10*3/uL (ref 4.0–10.5)
nRBC: 0 % (ref 0.0–0.2)

## 2020-08-17 LAB — BASIC METABOLIC PANEL
Anion gap: 7 (ref 5–15)
BUN: 7 mg/dL — ABNORMAL LOW (ref 8–23)
CO2: 24 mmol/L (ref 22–32)
Calcium: 7.3 mg/dL — ABNORMAL LOW (ref 8.9–10.3)
Chloride: 105 mmol/L (ref 98–111)
Creatinine, Ser: 0.64 mg/dL (ref 0.44–1.00)
GFR, Estimated: >60 mL/min (ref >60)
Glucose, Bld: 69 mg/dL — ABNORMAL LOW (ref 70–99)
Potassium: 3.3 mmol/L — ABNORMAL LOW (ref 3.5–5.1)
Sodium: 136 mmol/L (ref 135–145)

## 2020-08-17 LAB — CULTURE, RESPIRATORY W GRAM STAIN: Culture: NORMAL

## 2020-08-17 NOTE — Progress Notes (Signed)
Another N.S. 500 ml fl. Bolus given for Low B.P. see V.S. flowsheet .

## 2020-08-17 NOTE — Progress Notes (Addendum)
CBG 43. Patient alert and orin. Skin warm and dry. Patient had no complainst. Patient did not eat supper. Gave patient vanilla pudding and she ate all of it and 1/3 cup ice cream. Refused drink. Repeat CBG done at 20:56 105. Tim R.N. aware.

## 2020-08-17 NOTE — Progress Notes (Signed)
PROGRESS NOTE    Heather Jimenez  HYQ:[id number] DOB: 03/04/1946 DOA: 08/14/2020 PCP: Cher Nakai, MD   Brief Narrative:  75 year old woman with hx of PVD s/p fem-fram, aortofem bypass, left femoral pseudoaneurysm, right AKA, smoker, SCC of hypopharynx post laryngectomy.  She unfortunately developed left groin bleeding profuse at home, passed out and was brought to ER.  She underwent emergent left femoral false aneurysm repair with dacron placement from old left limb of aortofem graft to profundus femoris bypass on 6/1 with Dr. Donnetta Hutching. Post-operatively she remained on the ventilator and required vasopressors. PCCM was consulted for ICU management post operatively.  Patient transition to hospitalist team given resolution of hypoxia and control of bleeding.   Assessment & Plan:   Active Problems:   Femoral artery aneurysm (HCC)   Aneurysm (HCC)   Hemorrhagic shock (HCC)   Acute respiratory failure with hypoxia (HCC)   Acute blood loss anemia  Acute postop hypoxemic respiratory failure secondary to aspiration pneumonitis versus pneumonia-Improving S/p laryngectomy - ETT displaced and removed overnight 08/14/20. Trach collar over laryngectomy removed 08/16/20 - without hypoxia on room air.  - Pulmonary hygiene. DB, cough, oob as able. Unable to do IS or flutter valve. Start bed percussion -Continue to hold diuresis today in the setting of hypotension, currently on IV fluids, will be judicious in attempt to avoid excessive volume  Severe PVD with infected pseudoaneusym status post repair Postoperative oozing Postop shock combination hemorrhage and ?septic shock Acute Blood Loss Anemia - Weaned off pressors more than 24 hours ago  - DIC panel negative  - Shock likely secondary to volume loss/bleeding and anesthesia/analgesics -no acute infectious process suspected at this point, will discontinue antibiotics and follow fever curve and cultures -Transfuse PRBC if HBG less than 7  Repair of left  femoral faluse aneurysm with graft site - Procedure 08/14/2020 - Management per vascular surgery - DVT ppx per vascular surgery  Hyperglycemia without history of diabetes, A1c 5.5 Likely postop/acute phase reactant/stress-induced Episode of hypoglycemia overnight, will discontinue all insulin given A1c of 5.5  DVT prophylaxis: Aspirin 81 mg only given above Code Status: Full Family Communication: None present  Status is: Inpatient  Dispo: The patient is from: Home              Anticipated d/c is to: To be determined              Anticipated d/c date is: 48 to 72 hours              Patient currently not medically stable for discharge  Consultants:   Vascular surgery, PCCM  Procedures:   6/1 vascular surgery  6/2 ETT tube displaced  Antimicrobials:  Vanco Zosyn, ongoing  Subjective: Hypoglycemic hypotension overnight, improved with fluid boluses and D50.  Patient now stable denies nausea vomiting diarrhea constipation headache fevers or chills.  Objective: Vitals:   08/17/20 0300 08/17/20 0401 08/17/20 0550 08/17/20 0600  BP: 119/79 105/62 115/83 126/67  Pulse: (!) 108 (!) 108 (!) 115 (!) 113  Resp: 20 18 20 20   Temp:  98.8 F (37.1 C)    TempSrc:  Oral    SpO2: 97% 100% 100% 100%  Weight:      Height:        Intake/Output Summary (Last 24 hours) at 08/17/2020 0737 Last data filed at 08/17/2020 0558 Gross per 24 hour  Intake 1490 ml  Output 575 ml  Net 915 ml   Filed Weights   08/14/20 1120  08/14/20 1825  Weight: 34.9 kg 33.8 kg    Examination:  General:  Awake alert oriented no acute distress, resting comfortably in bed HEENT: MM pink/moist, anicteric, laryngectomy site open without purulence or bleeding Neuro: Without overt deficits CV: S1S2,  regular rate and rhythm no murmurs rubs or gallops PULM:   Clear to auscultate bilaterally without overt wheezes rales or rhonchi GI: soft, bsx4 active, nontender Renal: Foley in place, urine clear,  yellow Extremities: Strength 5 out of 5 globally without overt deficit Skin: Surgical groin dressing clean dry intact   Data Reviewed: I have personally reviewed following labs and imaging studies  CBC: Recent Labs  Lab 08/14/20 1123 08/14/20 1213 08/14/20 1759 08/14/20 1844 08/14/20 1902 08/15/20 0249 08/16/20 0222 08/17/20 0035  WBC 17.1*  --   --  11.7*  --  12.0* 11.3* 9.7  NEUTROABS 14.7*  --   --   --   --  10.6*  --   --   HGB 9.6*   < > 10.5* 11.2*  11.2*  --  11.5* 10.1* 10.8*  HCT 30.9*   < > 31.0* 33.0*  33.8*  --  34.8* 30.1* 32.9*  MCV 91.2  --   --  88.7  --  88.1 87.5 90.4  PLT 232  --   --  138* 144* 156 135* 140*   < > = values in this interval not displayed.   Basic Metabolic Panel: Recent Labs  Lab 08/14/20 1123 08/14/20 1213 08/14/20 1532 08/14/20 1759 08/14/20 1844 08/15/20 0249 08/16/20 0222 08/17/20 0035  NA 133* 134*   < > 139 138  136 135 135 136  K 3.4* 3.4*   < > 3.5 3.5  3.3* 4.4 3.5 3.3*  CL 101 102  --   --  99 103 103 105  CO2 16*  --   --   --  19* 22 25 24   GLUCOSE 226* 225*  --   --  192* 207* 124* 69*  BUN 8 8  --   --  6* 7* 7* 7*  CREATININE 0.87 0.60  --   --  0.68 0.79 0.81 0.64  CALCIUM 7.6*  --   --   --  8.3* 8.2* 8.0* 7.3*  MG  --   --   --   --  1.3* 1.7 1.8  --   PHOS  --   --   --   --  5.0*  --  2.3*  --    < > = values in this interval not displayed.   GFR: Estimated Creatinine Clearance: 32.4 mL/min (by C-G formula based on SCr of 0.64 mg/dL). Liver Function Tests: Recent Labs  Lab 08/15/20 0249  AST 18  ALT 7  ALKPHOS 42  BILITOT 1.4*  PROT 4.9*  ALBUMIN 3.2*   No results for input(s): LIPASE, AMYLASE in the last 168 hours. No results for input(s): AMMONIA in the last 168 hours. Coagulation Profile: Recent Labs  Lab 08/14/20 1123 08/14/20 1902 08/15/20 0249  INR 1.1 1.4* 1.2   Cardiac Enzymes: No results for input(s): CKTOTAL, CKMB, CKMBINDEX, TROPONINI in the last 168 hours. BNP (last 3  results) No results for input(s): PROBNP in the last 8760 hours. HbA1C: Recent Labs    08/14/20 1844  HGBA1C 5.5   CBG: Recent Labs  Lab 08/16/20 2018 08/16/20 2056 08/16/20 2349 08/17/20 0405 08/17/20 0731  GLUCAP 43* 105* 97 95 186*   Lipid Profile: No results for input(s): CHOL, HDL,  LDLCALC, TRIG, CHOLHDL, LDLDIRECT in the last 72 hours. Thyroid Function Tests: No results for input(s): TSH, T4TOTAL, FREET4, T3FREE, THYROIDAB in the last 72 hours. Anemia Panel: No results for input(s): VITAMINB12, FOLATE, FERRITIN, TIBC, IRON, RETICCTPCT in the last 72 hours. Sepsis Labs: No results for input(s): PROCALCITON, LATICACIDVEN in the last 168 hours.  Recent Results (from the past 240 hour(s))  Aerobic/Anaerobic Culture w Gram Stain (surgical/deep wound)     Status: None (Preliminary result)   Collection Time: 08/14/20  4:01 PM   Specimen: PATH Soft tissue  Result Value Ref Range Status   Specimen Description TISSUE LEFT FEMORAL PERIGRAFT SPEC A  Final   Special Requests LEFT FEMORAL PERIGRAFT  Final   Gram Stain   Final    RARE WBC PRESENT, PREDOMINANTLY MONONUCLEAR NO ORGANISMS SEEN    Culture   Final    NO GROWTH 2 DAYS NO ANAEROBES ISOLATED; CULTURE IN PROGRESS FOR 5 DAYS Performed at [facility], [address] [place], [place] [address]    Report Status PENDING  Incomplete  MRSA PCR Screening     Status: None   Collection Time: 08/14/20  6:44 PM   Specimen: Nasopharyngeal  Result Value Ref Range Status   MRSA by PCR NEGATIVE NEGATIVE Final    Comment:        The GeneXpert MRSA Assay (FDA approved for NASAL specimens only), is one component of a comprehensive MRSA colonization surveillance program. It is not intended to diagnose MRSA infection nor to guide or monitor treatment for MRSA infections. Performed at [facility], [place] [address] [place], [place] [address]   Culture, blood (routine x 2)     Status: None (Preliminary result)    Collection Time: 08/14/20  7:11 PM   Specimen: BLOOD  Result Value Ref Range Status   Specimen Description BLOOD SITE NOT SPECIFIED  Final   Special Requests   Final    BOTTLES DRAWN AEROBIC AND ANAEROBIC Blood Culture results may not be optimal due to an inadequate volume of blood received in culture bottles   Culture   Final    NO GROWTH 1 DAY Performed at [facility], [place] [address], [place], [place] [address]    Report Status PENDING  Incomplete  Culture, blood (routine x 2)     Status: None (Preliminary result)   Collection Time: 08/14/20  7:50 PM   Specimen: BLOOD  Result Value Ref Range Status   Specimen Description BLOOD LEFT HAND  Final   Special Requests   Final    BOTTLES DRAWN AEROBIC ONLY Blood Culture results may not be optimal due to an inadequate volume of blood received in culture bottles   Culture   Final    NO GROWTH 1 DAY Performed at [facility], [place] [address], [place], [place] [address]    Report Status PENDING  Incomplete  Anaerobic culture w Gram Stain     Status: None (Preliminary result)   Collection Time: 08/14/20  8:51 PM   Specimen: PATH Soft tissue  Result Value Ref Range Status   Specimen Description TRACHEAL ASPIRATE  Final   Special Requests NONE  Final   Gram Stain   Final    RARE WBC PRESENT,BOTH PMN AND MONONUCLEAR NO ORGANISMS SEEN Performed at [facility], [place] [address] [place], [place] [address]    Culture PENDING  Incomplete   Report Status PENDING  Incomplete  Culture, Respiratory w Gram Stain  Status: None (Preliminary result)   Collection Time: 08/14/20  8:51 PM   Specimen: Tracheal Aspirate; Respiratory  Result Value Ref Range Status   Specimen Description TRACHEAL ASPIRATE  Final   Special Requests NONE  Final   Gram Stain   Final    RARE WBC PRESENT, PREDOMINANTLY PMN NO ORGANISMS SEEN    Culture   Final    Normal respiratory flora-no Staph aureus or Pseudomonas seen Performed at [place] [address] [place], [place] [address]    Report Status PENDING  Incomplete  Resp Panel by RT-PCR (Flu A&B, Covid) Nasopharyngeal Swab     Status: None   Collection Time: 08/15/20  9:14 AM   Specimen: Nasopharyngeal Swab; Nasopharyngeal(NP) swabs in vial transport medium  Result Value Ref Range Status   SARS Coronavirus 2 by RT PCR NEGATIVE NEGATIVE Final    Comment: (NOTE) SARS-CoV-2 target nucleic acids are NOT DETECTED.  The SARS-CoV-2 RNA is generally detectable in upper respiratory specimens during the acute phase of infection. The lowest concentration of SARS-CoV-2 viral copies this assay can detect is 138 copies/mL. A negative result does not preclude SARS-Cov-2 infection and should not be used as the sole basis for treatment or other patient management decisions. A negative result may occur with  improper specimen collection/handling, submission of specimen other than nasopharyngeal swab, presence of viral mutation(s) within the areas targeted by this assay, and inadequate number of viral copies(<138 copies/mL). A negative result must be combined with clinical observations, patient history, and epidemiological information. The expected result is Negative.  Fact Sheet for Patients:  EntrepreneurPulse.com.au  Fact Sheet for Healthcare Providers:  IncredibleEmployment.be  This test is no t yet approved or cleared by the [place] FDA and  has been authorized for detection and/or diagnosis of SARS-CoV-2 by FDA under an Emergency Use Authorization (EUA). This EUA will remain  in effect (meaning this test can be used) for the duration of the COVID-19 declaration under Section 564(b)(1) of the Act, 21 U.S.C.section 360bbb-3(b)(1), unless the authorization is terminated  or revoked sooner.       Influenza A by PCR NEGATIVE NEGATIVE Final   Influenza B by PCR NEGATIVE NEGATIVE Final    Comment: (NOTE) The Xpert Xpress  SARS-CoV-2/FLU/RSV plus assay is intended as an aid in the diagnosis of influenza from Nasopharyngeal swab specimens and should not be used as a sole basis for treatment. Nasal washings and aspirates are unacceptable for Xpert Xpress SARS-CoV-2/FLU/RSV testing.  Fact Sheet for Patients: EntrepreneurPulse.com.au  Fact Sheet for Healthcare Providers: IncredibleEmployment.be  This test is not yet approved or cleared by the [place] FDA and has been authorized for detection and/or diagnosis of SARS-CoV-2 by FDA under an Emergency Use Authorization (EUA). This EUA will remain in effect (meaning this test can be used) for the duration of the COVID-19 declaration under Section 564(b)(1) of the Act, 21 U.S.C. section 360bbb-3(b)(1), unless the authorization is terminated or revoked.  Performed at [facility], [place] [address], [place], [place] [address]          Radiology Studies: No results found.      Scheduled Meds: . aspirin  81 mg Oral Daily  . chlorhexidine  15 mL Mouth Rinse BID  . Chlorhexidine Gluconate Cloth  6 each Topical Daily  . feeding supplement  237 mL Oral BID BM  . gabapentin  400 mg Oral TID  . insulin aspart  0-15 Units Subcutaneous Q4H  . mouth rinse  15 mL Mouth Rinse q12n4p  . nicotine  21 mg Transdermal Q24H  . pantoprazole (PROTONIX) IV  40 mg Intravenous QPM  . potassium chloride  40 mEq Oral Once   Continuous Infusions: . sodium chloride Stopped (08/14/20 1958)  . lactated ringers 100 mL/hr at 08/16/20 2323  . piperacillin-tazobactam (ZOSYN)  IV 3.375 g (08/17/20 0420)  . vancomycin 750 mg (08/16/20 2037)     LOS: 3 days   Time spent: 58min  Oneka Parada C Estell Dillinger, DO [facility]  If 7PM-7AM, please contact night-coverage [email or web address]  08/17/2020, 7:37 AM

## 2020-08-17 NOTE — Progress Notes (Signed)
N.S. fl. Bolus 500 ml given per prn orders.

## 2020-08-17 NOTE — Progress Notes (Signed)
I was notified by Kelby Fam of pt with hypotension SBP 60s. BP confirmed in other arm and manually.  Pt alert, able to follow commands. CBG recently 105. PRN order for 500cc bolus initiated by [facility]. Pt is responding to volume and order states may repeat if less than 100. Bolus later repeated. I followed up with Melanie later and SBP for pt was 118.

## 2020-08-17 NOTE — Evaluation (Signed)
Occupational Therapy Evaluation Patient Details Name: Heather Jimenez MRN: [id number] DOB: [birth year] Today's Date: 08/17/2020    History of Present Illness Pt is a 75 y/o female admitted 6/1 secondary to L femoral pseudoaneurysm bleed. Pt is s/p repair on 6/1. PMH includes R AKA, smoker, PVD s/p fem-fram, aortofem bypass, left femoral pseudoaneurysm, throat cancer s/p laryngectomy (still has stoma).   Clinical Impression   Patient admitted for the above diagnosis.  PTA she lives with her spouse, uses prothesis and RW for mobility in the home, bathes (sponge bath) and dresses herself sitting on the toilet, uses power scooter for meal prep and home management.  Barriers are listed below.  Currently she is needing up to min A for ADL and bed level, and Mod A for basic transfers.  Discussed bringing her R prothesis in to get a better current functional status, but her goal os to return home.  Home health can be considered depending on progress.      Follow Up Recommendations  Home health OT    Equipment Recommendations  None recommended by OT    Recommendations for Other Services       Precautions / Restrictions Precautions Precautions: Fall Precaution Comments: Watch HR Other Brace: has prosthetic      Mobility Bed Mobility Overal bed mobility: Needs Assistance Bed Mobility: Supine to Sit     Supine to sit: Mod assist       Patient Response: Cooperative  Transfers Overall transfer level: Needs assistance   Transfers: Sit to/from [employer] Transfers Sit to Stand: Mod assist   Squat pivot transfers: Mod assist     General transfer comment: without prothesis    Balance Overall balance assessment: Needs assistance Sitting-balance support: Bilateral upper extremity supported;Feet supported Sitting balance-Leahy Scale: Fair   Postural control: Posterior lean Standing balance support: Bilateral upper extremity supported Standing balance-Leahy Scale: Poor                              ADL either performed or assessed with clinical judgement   ADL Overall ADL's : Needs assistance/impaired     Grooming: Wash/dry hands;Wash/dry face;Supervision/safety;Sitting           Upper Body Dressing : Minimal assistance;Sitting Upper Body Dressing Details (indicate cue type and reason): EOB Lower Body Dressing: Minimal assistance;Bed level               Functional mobility during ADLs: Moderate assistance;+2 for safety/equipment General ADL Comments: squat pivot without prosthesis     Vision Baseline Vision/History: Wears glasses Wears Glasses: Reading only Patient Visual Report: No change from baseline       Perception     Praxis      Pertinent Vitals/Pain Pain Assessment: No/denies pain Pain Intervention(s): Monitored during session     Hand Dominance Left   Extremity/Trunk Assessment Upper Extremity Assessment Upper Extremity Assessment: Generalized weakness   Lower Extremity Assessment Lower Extremity Assessment: Defer to PT evaluation   Cervical / Trunk Assessment Cervical / Trunk Assessment: Kyphotic   Communication Communication Communication: Tracheostomy   Cognition Arousal/Alertness: Awake/alert Behavior During Therapy: WFL for tasks assessed/performed Overall Cognitive Status: Within Functional Limits for tasks assessed  Home Living Family/patient expects to be discharged to:: Private residence Living Arrangements: Spouse/significant other Available Help at Discharge: Family Type of Home: House Home Access: [place]: One level     Bathroom Shower/Tub: Teacher, early years/pre: [employer] Negron Torres: Wheelchair - power;Wheelchair - manual      Lives With: Spouse    Prior Functioning/Environment Level of Independence: Needs assistance  Gait / Transfers Assistance Needed: Is  independent with transfers to/from [facility] ADL's / Homemaking Assistance Needed: Reports family assists with ADL tasks.            OT Problem List: Impaired balance (sitting and/or standing)      OT Treatment/Interventions: Self-care/ADL training;Therapeutic exercise;DME and/or AE instruction;Balance training;Therapeutic activities    OT Goals(Current goals can be found in the care plan section) Acute Rehab OT Goals Patient Stated Goal: return home OT Goal Formulation: With patient Time For Goal Achievement: 08/31/20 Potential to Achieve Goals: Good ADL Goals Pt Will Perform Grooming: with set-up;sitting Pt Will Perform Upper Body Bathing: with set-up;sitting Pt Will Perform Lower Body Bathing: with set-up;bed level Pt Will Perform Upper Body Dressing: with set-up;sitting Pt Will Perform Lower Body Dressing: with set-up;bed level Pt Will Transfer to Toilet: with supervision;stand pivot transfer;bedside commode Pt Will Perform Toileting - Clothing Manipulation and hygiene: with supervision;sitting/lateral leans  OT Frequency: Min 2X/week   Barriers to D/C:    none noted       Co-evaluation              AM-PAC OT "6 Clicks" Daily Activity     Outcome Measure Help from another person eating meals?: None Help from another person taking care of personal grooming?: None Help from another person toileting, which includes using toliet, bedpan, or urinal?: A Lot Help from another person bathing (including washing, rinsing, drying)?: A Little Help from another person to put on and taking off regular upper body clothing?: A Little Help from another person to put on and taking off regular lower body clothing?: A Little 6 Click Score: 19   End of Session Nurse Communication: Mobility status  Activity Tolerance: Patient tolerated treatment well Patient left: in chair;with call bell/phone within reach;with chair alarm set  OT Visit Diagnosis: Unsteadiness on feet (R26.81)                 Time: 3382-5053 OT Time Calculation (min): 21 min Charges:  OT General Charges $OT Visit: 1 Visit OT Evaluation $OT Eval Moderate Complexity: 1 Mod  08/17/2020  Rich, OTR/L  Acute Rehabilitation Services  Office:  [phone]   Metta Clines 08/17/2020, 12:22 PM

## 2020-08-17 NOTE — Progress Notes (Addendum)
   08/16/20 2011  Vitals  Temp 98.6 F (37 C)  Temp Source Oral  BP (!) 82/67  MAP (mmHg) 74  BP Location Left Arm  BP Method Automatic  Patient Position (if appropriate) Lying  Pulse Rate (!) 103  Pulse Rate Source Monitor  ECG Heart Rate (!) 106  Resp 19  MEWS COLOR  MEWS Score Color Yellow  Oxygen Therapy  SpO2 92 %  MEWS Score  MEWS Temp 0  MEWS Systolic 1  MEWS Pulse 1  MEWS RR 0  MEWS LOC 0  MEWS Score 2  Patient alert and orin. With no complaints. B.P. low. Patient receiving L.R. at 100 ml /hr and Vancomycin at 150 ml/hr . Skin warm and dry. Resp. Even and unlab.

## 2020-08-17 NOTE — Progress Notes (Addendum)
  Progress Note    08/17/2020 8:07 AM 3 Days Post-Op  Subjective:  C/o some burning around right arm IV but resolved now.   Afebrile  Vitals:   08/17/20 0600 08/17/20 0736  BP: 126/67 103/80  Pulse: (!) 113 (!) 117  Resp: 20 18  Temp:  98.1 F (36.7 C)  SpO2: 100% 96%    Physical Exam: General:  No distress Lungs:  Non labored Incisions:  Left groin with nylon sutures in tact and no drainage on bandage that was timed at 9:30pm last evening.  Extremities:  + doppler signal left DP; no pain left foot.  CBC    Component Value Date/Time   WBC 9.7 08/17/2020 0035   RBC 3.64 (L) 08/17/2020 0035   HGB 10.8 (L) 08/17/2020 0035   HCT 32.9 (L) 08/17/2020 0035   PLT 140 (L) 08/17/2020 0035   MCV 90.4 08/17/2020 0035   MCH 29.7 08/17/2020 0035   MCHC 32.8 08/17/2020 0035   RDW 15.9 (H) 08/17/2020 0035   LYMPHSABS 0.5 (L) 08/15/2020 0249   MONOABS 0.8 08/15/2020 0249   EOSABS 0.0 08/15/2020 0249   BASOSABS 0.0 08/15/2020 0249    BMET    Component Value Date/Time   NA 136 08/17/2020 0035   K 3.3 (L) 08/17/2020 0035   CL 105 08/17/2020 0035   CO2 24 08/17/2020 0035   GLUCOSE 69 (L) 08/17/2020 0035   BUN 7 (L) 08/17/2020 0035   CREATININE 0.64 08/17/2020 0035   CALCIUM 7.3 (L) 08/17/2020 0035   GFRNONAA >60 08/17/2020 0035   GFRAA >60 12/31/2015 0820    INR    Component Value Date/Time   INR 1.2 08/15/2020 0249     Intake/Output Summary (Last 24 hours) at 08/17/2020 0807 Last data filed at 08/17/2020 0558 Gross per 24 hour  Intake 1490 ml  Output 575 ml  Net 915 ml     Assessment/Plan:  75 y.o. female is s/p:  Repair of left femoral false aneurysm with interposition of 6 mm PTFE from old left limb of aortofemoral graft to profundus femoris bypass. Excision of nonviable skin left groin   3 Days Post-Op  Pt looks good this morning and denies any dizziness or feeling bad.  Only complaint was some burning around IV site right arm, which looks fine and this has  resolved.  -left groin looks good this morning.  Bandage dated at 9:30pm last evening and is dry this morning.   -continues to have no growth from intra-op culture.  It is in progress for 5 days.   -+doppler signal left DP -pt with episode of hypotension last evening.  CBC revealed hgb stable.   -Per Dr. Donnetta Hutching, Her preoperative CT scan did show new saccular outpouchings around her aortic graft in her intra-abdominal area.  Clearly is not a candidate for explant of her graft due to multiple other comorbidities.  He discussed this with the patient and her husband.   Leontine Locket, PA-C [facility] [phone] 08/17/2020 8:07 AM   BP ok this morning mild tachycardia.  Hemoglobin stable.  Left groin incision intact scant serous drainage Agree with above.  Ok for d/c from our standpoint when medically ready.  [name], MD [facility] Office: [phone]

## 2020-08-17 NOTE — Progress Notes (Signed)
Call not return by M.D. Text paged Dr. Cyd Silence again

## 2020-08-17 NOTE — Progress Notes (Signed)
Dr. Cyd Silence return call see orders

## 2020-08-17 NOTE — Progress Notes (Addendum)
   08/16/20 2206  Vitals  Temp (!) 97.3 F (36.3 C)  Temp Source Axillary  BP (!) 61/44  MAP (mmHg) (!) 51  BP Location Left Arm  BP Method Automatic  Patient Position (if appropriate) Lying  Pulse Rate (!) 113  Pulse Rate Source Monitor  ECG Heart Rate (!) 114  Resp 20  Level of Consciousness  Level of Consciousness Alert  MEWS COLOR  MEWS Score Color Red  Oxygen Therapy  SpO2 96 %  O2 Device Tracheostomy Collar  O2 Flow Rate (L/min) 6 L/min  FiO2 (%) 35 %  MEWS Score  MEWS Temp 0  MEWS Systolic 3  MEWS Pulse 2  MEWS RR 0  MEWS LOC 0  MEWS Score 5  See V.S. above Dr. Cyd Silence text page the above at 22:15 and [facility]. with rapid response aware and coming to see patient. Tim R.N. charge nurse aware and into see patient. Patient is asymptomatic Also, V.S. done in left arm and manually see v.s. flow sheet.

## 2020-08-17 NOTE — Progress Notes (Signed)
Mobility Specialist - Progress Note   08/17/20 1523  Mobility  Activity Transferred:  Chair to bed  Level of Assistance Minimal assist, patient does 75% or more  Assistive Device None  Mobility Out of bed to chair with meals  Mobility Response Tolerated well  Mobility performed by Mobility specialist  $Mobility charge 1 Mobility   Pt min assist to complete multiple stand and scoots from recliner to bed. VSS throughout. Pt left in room w/ NT.   Pricilla Handler Mobility Specialist Mobility Specialist Phone: [phone]

## 2020-08-17 NOTE — Progress Notes (Signed)
Pharmacy Antibiotic Note  Heather Jimenez is a 75 y.o. female admitted on 08/14/2020 with rupture of L groin pseudoaneurysm, L groin bleeding; pt is S/P pseudoaneurysm repair by VVS this afternoon. Pharmacy has been consulted for vancomycin and Zosyn dosing wound infection in L groin.  WBC down to 9.7, afebrile; Scr 0.64, CrCl 32.4 ml/min. Will consider levels closer to steady state.   Plan: - Zosyn 3.375 gm IV Q 8 hrs (extended infusion) - Vancomycin 750 mg IV Q 48 hrs (estimated vancomycin AUC, using Scr 0.80, is 492.2; goal vancomycin AUC is 400-550) - Monitor WBC, temp, clinical improvement, cultures, renal function, vancomycin levels as indicated  Height: 5\' 3"  (160 cm) Weight: 33.8 kg (74 lb 8.3 oz) IBW/kg (Calculated) : 52.4  Temp (24hrs), Avg:98 F (36.7 C), Min:97.3 F (36.3 C), Max:98.9 F (37.2 C)  Recent Labs  Lab 08/14/20 1123 08/14/20 1213 08/14/20 1844 08/15/20 0249 08/16/20 0222 08/17/20 0035  WBC 17.1*  --  11.7* 12.0* 11.3* 9.7  CREATININE 0.87 0.60 0.68 0.79 0.81 0.64    Estimated Creatinine Clearance: 32.4 mL/min (by C-G formula based on SCr of 0.64 mg/dL).    No Known Allergies  Antimicrobials this admission: Cefazolin X 1 pre op 6/1 Vancomycin  6/1 >> Zosyn 6/1 >>  Microbiology results: 6/1 Surgical cx: pending, no orgs seen 6/1 Surgical cx (fungus): pending  Thank you for allowing pharmacy to be a part of this patient's care.  Mercy Riding, PharmD PGY1 Acute Care Pharmacy Resident Please refer to [facility] for unit-specific pharmacist

## 2020-08-17 NOTE — Progress Notes (Addendum)
   08/16/20 2046  Vitals  Temp 97.7 F (36.5 C)  Temp Source Axillary  BP 97/61  MAP (mmHg) 73  BP Location Left Arm  BP Method Automatic  Patient Position (if appropriate) Sitting  Pulse Rate (!) 129  Pulse Rate Source Monitor  ECG Heart Rate (!) 128  Resp (!) 24  Level of Consciousness  Level of Consciousness Alert  MEWS COLOR  MEWS Score Color Red  Oxygen Therapy  SpO2 91 %  O2 Device Tracheostomy Collar;HFNC  O2 Flow Rate (L/min) 6 L/min  FiO2 (%) 35 %  Pain Assessment  Pain Scale 0-10  Pain Score 0  MEWS Score  MEWS Temp 0  MEWS Systolic 1  MEWS Pulse 2  MEWS RR 1  MEWS LOC 0  MEWS Score 4  Repeat v.s.  see above

## 2020-08-17 NOTE — Progress Notes (Addendum)
   08/16/20 2135  Vitals  Temp 98 F (36.7 C)  Temp Source Axillary  BP 104/63  MAP (mmHg) 76  BP Location Left Arm  BP Method Automatic  Patient Position (if appropriate) Lying  Pulse Rate (!) 130  Pulse Rate Source Monitor  ECG Heart Rate (!) 130  Resp (!) 24  Level of Consciousness  Level of Consciousness Alert  MEWS COLOR  MEWS Score Color Red  Oxygen Therapy  SpO2 92 %  O2 Device Tracheostomy Collar  O2 Flow Rate (L/min) 6 L/min  FiO2 (%) 35 %  Patient Activity (if Appropriate) In bed  Pulse Oximetry Type Continuous  MEWS Score  MEWS Temp 0  MEWS Systolic 0  MEWS Pulse 3  MEWS RR 1  MEWS LOC 0  MEWS Score 4  Repeat v.s see above

## 2020-08-18 LAB — CBC
HCT: 34.5 % — ABNORMAL LOW (ref 36.0–46.0)
Hemoglobin: 11.2 g/dL — ABNORMAL LOW (ref 12.0–15.0)
MCH: 29.6 pg (ref 26.0–34.0)
MCHC: 32.5 g/dL (ref 30.0–36.0)
MCV: 91 fL (ref 80.0–100.0)
Platelets: 143 10*3/uL — ABNORMAL LOW (ref 150–400)
RBC: 3.79 MIL/uL — ABNORMAL LOW (ref 3.87–5.11)
RDW: 15.9 % — ABNORMAL HIGH (ref 11.5–15.5)
WBC: 8.1 10*3/uL (ref 4.0–10.5)
nRBC: 0 % (ref 0.0–0.2)

## 2020-08-18 LAB — BASIC METABOLIC PANEL
Anion gap: 7 (ref 5–15)
BUN: 7 mg/dL — ABNORMAL LOW (ref 8–23)
CO2: 23 mmol/L (ref 22–32)
Calcium: 7.3 mg/dL — ABNORMAL LOW (ref 8.9–10.3)
Chloride: 105 mmol/L (ref 98–111)
Creatinine, Ser: 0.55 mg/dL (ref 0.44–1.00)
GFR, Estimated: >60 mL/min (ref >60)
Glucose, Bld: 81 mg/dL (ref 70–99)
Potassium: 3.6 mmol/L (ref 3.5–5.1)
Sodium: 135 mmol/L (ref 135–145)

## 2020-08-18 LAB — GLUCOSE, CAPILLARY
Glucose-Capillary: 110 mg/dL — ABNORMAL HIGH (ref 70–99)
Glucose-Capillary: 119 mg/dL — ABNORMAL HIGH (ref 70–99)
Glucose-Capillary: 168 mg/dL — ABNORMAL HIGH (ref 70–99)
Glucose-Capillary: 192 mg/dL — ABNORMAL HIGH (ref 70–99)
Glucose-Capillary: 204 mg/dL — ABNORMAL HIGH (ref 70–99)
Glucose-Capillary: 78 mg/dL (ref 70–99)

## 2020-08-18 MED ORDER — PANTOPRAZOLE SODIUM 40 MG PO TBEC
40.0000 mg | DELAYED_RELEASE_TABLET | Freq: Every evening | ORAL | Status: DC
  Filled 2020-08-18: qty 1

## 2020-08-18 NOTE — Progress Notes (Signed)
PROGRESS NOTE    Heather Jimenez  TWS:[id number] DOB: 07/31/1945 DOA: 08/14/2020 PCP: Cher Nakai, MD   Brief Narrative:  75 year old woman with hx of PVD s/p fem-fram, aortofem bypass, left femoral pseudoaneurysm, right AKA, smoker, SCC of hypopharynx post laryngectomy.  She unfortunately developed left groin bleeding profuse at home, passed out and was brought to ER.  She underwent emergent left femoral false aneurysm repair with dacron placement from old left limb of aortofem graft to profundus femoris bypass on 6/1 with Dr. Donnetta Hutching. Post-operatively she remained on the ventilator and required vasopressors. PCCM was consulted for ICU management post operatively.  Patient transition to hospitalist team given resolution of hypoxia and control of bleeding.  Assessment & Plan:   Active Problems:   Femoral artery aneurysm (HCC)   Aneurysm (HCC)   Hemorrhagic shock (HCC)   Acute respiratory failure with hypoxia (HCC)   Acute blood loss anemia  Acute postop hypoxemic respiratory failure secondary to aspiration pneumonitis versus pneumonia, resolving S/p laryngectomy - ETT displaced and removed overnight 08/14/20. Trach collar over laryngectomy replaced overnight, weaned back to room air without hypoxia today.  - Pulmonary hygiene; oob as able. Unable to do IS or flutter valve.  Continue bed percussion -Continue to hold diuresis today in the setting of hypotension, currently on IV fluids, will be judicious in attempt to avoid excessive volume  Severe PVD with infected pseudoaneusym status post repair Postoperative oozing Postop shock combination hemorrhage and ?septic shock Acute Blood Loss Anemia - Weaned off pressors more than 24 hours ago  - DIC panel negative  - Shock likely secondary to volume loss/bleeding and anesthesia/analgesics -no acute infectious process suspected at this point, will discontinue antibiotics and follow fever curve and cultures -Transfuse PRBC if HBG less than  7  Repair of left femoral faluse aneurysm with graft site - Procedure 08/14/2020 - Management per vascular surgery - DVT ppx per vascular surgery  Severe protein caloric malnutrition -Ongoing, borderline hypoglycemia again yesterday despite discontinuation of all insulin -Nutrition to follow, encouraged ongoing p.o. intake and meal supplement  Hyperglycemia without history of diabetes, A1c 5.5 Likely postop/acute phase reactant/stress-induced Episode of hypoglycemia overnight, will discontinue all insulin given A1c of 5.5  DVT prophylaxis: Aspirin 81 mg only given above Code Status: Full Family Communication: None present  Status is: Inpatient  Dispo: The patient is from: Home              Anticipated d/c is to: To be determined              Anticipated d/c date is: 48 to 72 hours              Patient currently not medically stable for discharge  Consultants:   Vascular surgery, PCCM  Procedures:   6/1 vascular surgery  6/2 ETT tube displaced  Antimicrobials:  Vanco Zosyn, ongoing  Subjective: Mild hypoglycemia yesterday late without associated hypotension previously noted.  Patient feels quite well denies nausea vomiting diarrhea constipation headache fevers or chills.  She is back on supplemental oxygen today but does not recall when or why she was put on it, she denies any respiratory symptoms..  Objective: Vitals:   08/17/20 1939 08/17/20 2309 08/18/20 0327 08/18/20 0449  BP: (!) 135/98 96/61 111/70 (!) 98/58  Pulse: (!) 120 100 87 80  Resp: 18 20 15 16   Temp: 99.1 F (37.3 C) 98.8 F (37.1 C) 99 F (37.2 C)   TempSrc: Oral Oral Oral   SpO2:  95% 94% 96% 97%  Weight:      Height:        Intake/Output Summary (Last 24 hours) at 08/18/2020 8127 Last data filed at 08/17/2020 1526 Gross per 24 hour  Intake --  Output 300 ml  Net -300 ml   Filed Weights   08/14/20 1120 08/14/20 1825  Weight: 34.9 kg 33.8 kg    Examination:  General:  Awake alert  oriented no acute distress, resting comfortably in bed HEENT: MM pink/moist, anicteric, laryngectomy site open without purulence or bleeding Neuro: Without overt deficits CV: S1S2,  regular rate and rhythm no murmurs rubs or gallops PULM:   Clear to auscultate bilaterally without overt wheezes rales or rhonchi GI: soft, bsx4 active, nontender Renal: Foley in place, urine clear, yellow Extremities: Strength 5 out of 5 globally without overt deficit Skin: Surgical groin dressing clean dry intact   Data Reviewed: I have personally reviewed following labs and imaging studies  CBC: Recent Labs  Lab 08/14/20 1123 08/14/20 1213 08/14/20 1844 08/14/20 1902 08/15/20 0249 08/16/20 0222 08/17/20 0035 08/18/20 0059  WBC 17.1*  --  11.7*  --  12.0* 11.3* 9.7 8.1  NEUTROABS 14.7*  --   --   --  10.6*  --   --   --   HGB 9.6*   < > 11.2*  11.2*  --  11.5* 10.1* 10.8* 11.2*  HCT 30.9*   < > 33.0*  33.8*  --  34.8* 30.1* 32.9* 34.5*  MCV 91.2  --  88.7  --  88.1 87.5 90.4 91.0  PLT 232  --  138* 144* 156 135* 140* 143*   < > = values in this interval not displayed.   Basic Metabolic Panel: Recent Labs  Lab 08/14/20 1844 08/15/20 0249 08/16/20 0222 08/17/20 0035 08/18/20 0059  NA 138  136 135 135 136 135  K 3.5  3.3* 4.4 3.5 3.3* 3.6  CL 99 103 103 105 105  CO2 19* 22 25 24 23   GLUCOSE 192* 207* 124* 69* 81  BUN 6* 7* 7* 7* 7*  CREATININE 0.68 0.79 0.81 0.64 0.55  CALCIUM 8.3* 8.2* 8.0* 7.3* 7.3*  MG 1.3* 1.7 1.8  --   --   PHOS 5.0*  --  2.3*  --   --    GFR: Estimated Creatinine Clearance: 32.4 mL/min (by C-G formula based on SCr of 0.55 mg/dL). Liver Function Tests: Recent Labs  Lab 08/15/20 0249  AST 18  ALT 7  ALKPHOS 42  BILITOT 1.4*  PROT 4.9*  ALBUMIN 3.2*   No results for input(s): LIPASE, AMYLASE in the last 168 hours. No results for input(s): AMMONIA in the last 168 hours. Coagulation Profile: Recent Labs  Lab 08/14/20 1123 08/14/20 1902  08/15/20 0249  INR 1.1 1.4* 1.2   Cardiac Enzymes: No results for input(s): CKTOTAL, CKMB, CKMBINDEX, TROPONINI in the last 168 hours. BNP (last 3 results) No results for input(s): PROBNP in the last 8760 hours. HbA1C: No results for input(s): HGBA1C in the last 72 hours. CBG: Recent Labs  Lab 08/17/20 1645 08/17/20 1646 08/17/20 2032 08/17/20 2320 08/18/20 0352  GLUCAP 52* 52* 124* 114* 78   Lipid Profile: No results for input(s): CHOL, HDL, LDLCALC, TRIG, CHOLHDL, LDLDIRECT in the last 72 hours. Thyroid Function Tests: No results for input(s): TSH, T4TOTAL, FREET4, T3FREE, THYROIDAB in the last 72 hours. Anemia Panel: No results for input(s): VITAMINB12, FOLATE, FERRITIN, TIBC, IRON, RETICCTPCT in the last 72 hours. Sepsis Labs:  No results for input(s): PROCALCITON, LATICACIDVEN in the last 168 hours.  Recent Results (from the past 240 hour(s))  Aerobic/Anaerobic Culture w Gram Stain (surgical/deep wound)     Status: None (Preliminary result)   Collection Time: 08/14/20  4:01 PM   Specimen: PATH Soft tissue  Result Value Ref Range Status   Specimen Description TISSUE LEFT FEMORAL PERIGRAFT SPEC A  Final   Special Requests LEFT FEMORAL PERIGRAFT  Final   Gram Stain   Final    RARE WBC PRESENT, PREDOMINANTLY MONONUCLEAR NO ORGANISMS SEEN    Culture   Final    NO GROWTH 3 DAYS NO ANAEROBES ISOLATED; CULTURE IN PROGRESS FOR 5 DAYS Performed at [facility], [address], [place], [place] [address]    Report Status PENDING  Incomplete  MRSA PCR Screening     Status: None   Collection Time: 08/14/20  6:44 PM   Specimen: Nasopharyngeal  Result Value Ref Range Status   MRSA by PCR NEGATIVE NEGATIVE Final    Comment:        The GeneXpert MRSA Assay (FDA approved for NASAL specimens only), is one component of a comprehensive MRSA colonization surveillance program. It is not intended to diagnose MRSA infection nor to guide or monitor treatment for MRSA  infections. Performed at [facility], [place] [address], [place], [place] [address]   Culture, blood (routine x 2)     Status: None (Preliminary result)   Collection Time: 08/14/20  7:11 PM   Specimen: BLOOD  Result Value Ref Range Status   Specimen Description BLOOD SITE NOT SPECIFIED  Final   Special Requests   Final    BOTTLES DRAWN AEROBIC AND ANAEROBIC Blood Culture results may not be optimal due to an inadequate volume of blood received in culture bottles   Culture   Final    NO GROWTH 2 DAYS Performed at [facility], [place] [address] [place], [place] [address]    Report Status PENDING  Incomplete  Culture, blood (routine x 2)     Status: None (Preliminary result)   Collection Time: 08/14/20  7:50 PM   Specimen: BLOOD  Result Value Ref Range Status   Specimen Description BLOOD LEFT HAND  Final   Special Requests   Final    BOTTLES DRAWN AEROBIC ONLY Blood Culture results may not be optimal due to an inadequate volume of blood received in culture bottles   Culture   Final    NO GROWTH 2 DAYS Performed at [facility], [place] [address], [place], [place] [address]    Report Status PENDING  Incomplete  Anaerobic culture w Gram Stain     Status: None (Preliminary result)   Collection Time: 08/14/20  8:51 PM   Specimen: PATH Soft tissue  Result Value Ref Range Status   Specimen Description TRACHEAL ASPIRATE  Final   Special Requests NONE  Final   Gram Stain   Final    RARE WBC PRESENT,BOTH PMN AND MONONUCLEAR NO ORGANISMS SEEN Performed at [facility], [place] [address] [place], [place] [address]    Culture   Final    NO ANAEROBES ISOLATED; CULTURE IN PROGRESS FOR 5 DAYS   Report Status PENDING  Incomplete  Culture, Respiratory w Gram Stain     Status: None   Collection Time: 08/14/20  8:51 PM   Specimen: Tracheal Aspirate; Respiratory  Result Value Ref Range Status   Specimen Description TRACHEAL ASPIRATE  Final  Special Requests NONE  Final   Gram  Stain   Final    RARE WBC PRESENT, PREDOMINANTLY PMN NO ORGANISMS SEEN    Culture   Final    RARE Normal respiratory flora-no Staph aureus or Pseudomonas seen Performed at [place] [address] [place], [place] [address]    Report Status 08/17/2020 FINAL  Final  Resp Panel by RT-PCR (Flu A&B, Covid) Nasopharyngeal Swab     Status: None   Collection Time: 08/15/20  9:14 AM   Specimen: Nasopharyngeal Swab; Nasopharyngeal(NP) swabs in vial transport medium  Result Value Ref Range Status   SARS Coronavirus 2 by RT PCR NEGATIVE NEGATIVE Final    Comment: (NOTE) SARS-CoV-2 target nucleic acids are NOT DETECTED.  The SARS-CoV-2 RNA is generally detectable in upper respiratory specimens during the acute phase of infection. The lowest concentration of SARS-CoV-2 viral copies this assay can detect is 138 copies/mL. A negative result does not preclude SARS-Cov-2 infection and should not be used as the sole basis for treatment or other patient management decisions. A negative result may occur with  improper specimen collection/handling, submission of specimen other than nasopharyngeal swab, presence of viral mutation(s) within the areas targeted by this assay, and inadequate number of viral copies(<138 copies/mL). A negative result must be combined with clinical observations, patient history, and epidemiological information. The expected result is Negative.  Fact Sheet for Patients:  EntrepreneurPulse.com.au  Fact Sheet for Healthcare Providers:  IncredibleEmployment.be  This test is no t yet approved or cleared by the [place] FDA and  has been authorized for detection and/or diagnosis of SARS-CoV-2 by FDA under an Emergency Use Authorization (EUA). This EUA will remain  in effect (meaning this test can be used) for the duration of the COVID-19 declaration under Section 564(b)(1) of the Act, 21 U.S.C.section 360bbb-3(b)(1), unless  the authorization is terminated  or revoked sooner.       Influenza A by PCR NEGATIVE NEGATIVE Final   Influenza B by PCR NEGATIVE NEGATIVE Final    Comment: (NOTE) The Xpert Xpress SARS-CoV-2/FLU/RSV plus assay is intended as an aid in the diagnosis of influenza from Nasopharyngeal swab specimens and should not be used as a sole basis for treatment. Nasal washings and aspirates are unacceptable for Xpert Xpress SARS-CoV-2/FLU/RSV testing.  Fact Sheet for Patients: EntrepreneurPulse.com.au  Fact Sheet for Healthcare Providers: IncredibleEmployment.be  This test is not yet approved or cleared by the [place] FDA and has been authorized for detection and/or diagnosis of SARS-CoV-2 by FDA under an Emergency Use Authorization (EUA). This EUA will remain in effect (meaning this test can be used) for the duration of the COVID-19 declaration under Section 564(b)(1) of the Act, 21 U.S.C. section 360bbb-3(b)(1), unless the authorization is terminated or revoked.  Performed at [facility], [place] [address] [place], [place] [address]          Radiology Studies: No results found.      Scheduled Meds: . aspirin  81 mg Oral Daily  . chlorhexidine  15 mL Mouth Rinse BID  . Chlorhexidine Gluconate Cloth  6 each Topical Daily  . feeding supplement  237 mL Oral BID BM  . gabapentin  400 mg Oral TID  . mouth rinse  15 mL Mouth Rinse q12n4p  . nicotine  21 mg Transdermal Q24H  . pantoprazole (PROTONIX) IV  40 mg Intravenous QPM  . potassium chloride  40 mEq Oral Once   Continuous Infusions: . sodium chloride Stopped (  08/14/20 1958)  . lactated ringers 100 mL/hr at 08/17/20 1038     LOS: 4 days   Time spent: 54min  Shiane Wenberg C Rola Lennon, DO [facility]  If 7PM-7AM, please contact night-coverage [email or web address]  08/18/2020, 7:14 AM

## 2020-08-18 NOTE — Progress Notes (Addendum)
  Progress Note    08/18/2020 7:27 AM 4 Days Post-Op  Subjective:  Feels good.  Says her left heel is a little sore.  Wants to go home.   Tm 99.1 now afebrile   Vitals:   08/18/20 0327 08/18/20 0449  BP: 111/70 (!) 98/58  Pulse: 87 80  Resp: 15 16  Temp: 99 F (37.2 C)   SpO2: 96% 97%    Physical Exam: General:  Sitting up in bed smiling.  Looks good.  Lungs:  On TC O2; non labored breathing Incisions:  Left groin incision is clean with mild serous drainage on bandage.   Extremities:  Left heel without wound.  Left foot is warm.    CBC    Component Value Date/Time   WBC 8.1 08/18/2020 0059   RBC 3.79 (L) 08/18/2020 0059   HGB 11.2 (L) 08/18/2020 0059   HCT 34.5 (L) 08/18/2020 0059   PLT 143 (L) 08/18/2020 0059   MCV 91.0 08/18/2020 0059   MCH 29.6 08/18/2020 0059   MCHC 32.5 08/18/2020 0059   RDW 15.9 (H) 08/18/2020 0059   LYMPHSABS 0.5 (L) 08/15/2020 0249   MONOABS 0.8 08/15/2020 0249   EOSABS 0.0 08/15/2020 0249   BASOSABS 0.0 08/15/2020 0249    BMET    Component Value Date/Time   NA 135 08/18/2020 0059   K 3.6 08/18/2020 0059   CL 105 08/18/2020 0059   CO2 23 08/18/2020 0059   GLUCOSE 81 08/18/2020 0059   BUN 7 (L) 08/18/2020 0059   CREATININE 0.55 08/18/2020 0059   CALCIUM 7.3 (L) 08/18/2020 0059   GFRNONAA >60 08/18/2020 0059   GFRAA >60 12/31/2015 0820    INR    Component Value Date/Time   INR 1.2 08/15/2020 0249     Intake/Output Summary (Last 24 hours) at 08/18/2020 0727 Last data filed at 08/17/2020 1526 Gross per 24 hour  Intake --  Output 300 ml  Net -300 ml    Specimen Description TISSUE LEFT FEMORAL PERIGRAFT SPEC A   Special Requests LEFT FEMORAL PERIGRAFT   Gram Stain RARE WBC PRESENT, PREDOMINANTLY MONONUCLEAR  NO ORGANISMS SEEN   Culture NO GROWTH 3 DAYS NO ANAEROBES ISOLATED; CULTURE IN PROGRESS FOR 5 DAYS  Performed at [facility], [address], [place], [place] [address]   Report Status PENDING       Assessment/Plan:  75 y.o. female is s/p:  Repair of left femoral false aneurysm with interposition of 62 mmPTFEfrom old left limb of aortofemoral graft to profundus femoris bypass. Excision of nonviable skin left groin  4 Days Post-Op   -pt looks good this morning.  She is requiring some O2 via trach.   -acute blood loss anemia-hgb improving and is up to 11.2 from 10.8. -wound cx remains negative with no growth x 3 days.  WBC normal and pt remains afebrile.  Incision continues to look good with some serous drainage on bandage.   -DVT prophylaxis:  Continue with SCD    Leontine Locket, PA-C [facility] [phone] 08/18/2020 7:27 AM   Agree with above So far groin is healing Ok for d/c from our [place], MD [facility] [place] Office: [phone]

## 2020-08-19 LAB — CBC
HCT: 32.9 % — ABNORMAL LOW (ref 36.0–46.0)
Hemoglobin: 10.6 g/dL — ABNORMAL LOW (ref 12.0–15.0)
MCH: 29.1 pg (ref 26.0–34.0)
MCHC: 32.2 g/dL (ref 30.0–36.0)
MCV: 90.4 fL (ref 80.0–100.0)
Platelets: 205 10*3/uL (ref 150–400)
RBC: 3.64 MIL/uL — ABNORMAL LOW (ref 3.87–5.11)
RDW: 15.9 % — ABNORMAL HIGH (ref 11.5–15.5)
WBC: 7.8 10*3/uL (ref 4.0–10.5)
nRBC: 0 % (ref 0.0–0.2)

## 2020-08-19 LAB — BASIC METABOLIC PANEL
Anion gap: 6 (ref 5–15)
BUN: 5 mg/dL — ABNORMAL LOW (ref 8–23)
CO2: 27 mmol/L (ref 22–32)
Calcium: 7.6 mg/dL — ABNORMAL LOW (ref 8.9–10.3)
Chloride: 103 mmol/L (ref 98–111)
Creatinine, Ser: 0.48 mg/dL (ref 0.44–1.00)
GFR, Estimated: >60 mL/min (ref >60)
Glucose, Bld: 91 mg/dL (ref 70–99)
Potassium: 3.7 mmol/L (ref 3.5–5.1)
Sodium: 136 mmol/L (ref 135–145)

## 2020-08-19 LAB — GLUCOSE, CAPILLARY
Glucose-Capillary: 94 mg/dL (ref 70–99)
Glucose-Capillary: 102 mg/dL — ABNORMAL HIGH (ref 70–99)
Glucose-Capillary: 179 mg/dL — ABNORMAL HIGH (ref 70–99)
Glucose-Capillary: 108 mg/dL — ABNORMAL HIGH (ref 70–99)

## 2020-08-19 LAB — AEROBIC/ANAEROBIC CULTURE W GRAM STAIN (SURGICAL/DEEP WOUND): Culture: NO GROWTH

## 2020-08-19 MED ORDER — ENSURE ENLIVE PO LIQD: 237.0000 mL | Freq: Two times a day (BID) | ORAL | 12 refills | Status: AC

## 2020-08-19 MED ORDER — SENNOSIDES-DOCUSATE SODIUM 8.6-50 MG PO TABS: 1.0000 | ORAL_TABLET | Freq: Every evening | ORAL | 0 refills | Status: AC | PRN

## 2020-08-19 NOTE — Progress Notes (Signed)
SATURATION QUALIFICATIONS: (This note is used to comply with regulatory documentation for home oxygen)  Patient Saturations on Room Air at Rest = 84%  Patient Saturations on Room Air while Ambulating = patient is non-ambulatory    Please briefly explain why patient needs home oxygen:

## 2020-08-19 NOTE — Care Management Important Message (Signed)
Important Message  Patient Details  Name: Heather Jimenez MRN: [id number] Date of Birth: September 13, 1945   Medicare Important Message Given:  Yes     Shelda Altes 08/19/2020, 10:49 AM

## 2020-08-19 NOTE — Progress Notes (Addendum)
Physical Therapy Treatment Patient Details Name: Heather Jimenez MRN: [id number] DOB: 1945-10-11 Today's Date: 08/19/2020    History of Present Illness Pt is a 75 y/o female admitted 6/1 secondary to L femoral pseudoaneurysm bleed. Pt is s/p repair on 6/1. PMH includes R AKA, smoker, PVD s/p fem-fram, aortofem bypass, left femoral pseudoaneurysm, throat cancer s/p laryngectomy (still has stoma).    PT Comments    Pt received in supine, agreeable to therapy session and with good tolerance for bed mobility and transfer training. Pt able to perform all mobility tasks with min guard and increased time but limited due to incontinence (pt reported urinary incontinence but per NT it was just stool, medical team awaiting urinary function prior to DC). Pt with elevated BP and HR and c/o feeling warm, RN notified. Pt SpO2 WNL on 2L O2 via trach mask.  Orthostatic BPs Supine 147/86 (104); HR 100 bpm  Sitting 180/110 (129); HR 120 bpm  Sitting after 3 min 168/107 (124); HR 121 bpm  UTA standing, incontinent   Supine 177/110 (131); HR 110 bpm  Pt continues to benefit from PT services to progress toward functional mobility goals. Discharge recommendations updated below per discussion with supervising PT [place] and patient, case manager notified.   Follow Up Recommendations  Home health PT;Supervision for mobility/OOB     Equipment Recommendations  Other (comment) (slide board (pt unclear if she owns one, may need to confirm with family))    Recommendations for Other Services       Precautions / Restrictions Precautions Precautions: Fall Precaution Comments: Watch HR/BP Required Braces or Orthoses: Other Brace Other Brace: has prosthetic (at home) Restrictions Weight Bearing Restrictions: No    Mobility  Bed Mobility Overal bed mobility: Needs Assistance Bed Mobility: Supine to Sit;Sit to Supine Rolling: Min guard   Supine to sit: Min guard;HOB elevated Sit to supine: Min guard    General bed mobility comments: pt tachy to 120's bpm with bed mobility but pt denies acute s/sx distress; BP elevated and pt reports urinary incontinence but declining to pivot to [facility] (has briefs donned) so deferred standing transfer attempts as pt requesting clean-up and preparing to discharge    Transfers Overall transfer level: Needs assistance   Transfers: Lateral/Scoot Transfers          Lateral/Scoot Transfers: Min guard General transfer comment: without prothesis with flat HOB, scooting toward HOB ~4-5 lateral scoots, pt able to perform without transfer pad assist using B UE and U LE assist, cues for technique; pt defer OOB transfer wanting new briefs and bathing from tech, wants to prepare for discharge      Balance Overall balance assessment: Needs assistance Sitting-balance support: Bilateral upper extremity supported;Feet supported Sitting balance-Leahy Scale: Fair Sitting balance - Comments: tends to lean toward R side while seated EOB but able to sit fully upright when prompted, unsure if lean due to pt sudden urinary incontinence/discomfort or due to decreased balance Postural control: Right lateral lean           Cognition Arousal/Alertness: Awake/alert Behavior During Therapy: WFL for tasks assessed/performed Overall Cognitive Status: Within Functional Limits for tasks assessed                                 General Comments: pleasant, cooperative      Exercises Amputee Exercises Knee Extension: AROM;Left;Seated;15 reps    General Comments General comments (skin integrity, edema, etc.): BP  elevated (see summary above), RN notified; HR elevated to 120's bpm with mobility and pt with urinary incontinence. SpO2 WNL >92% on 2L trach O2 mask      Pertinent Vitals/Pain Pain Assessment: No/denies pain Pain Intervention(s): Monitored during session;Repositioned           PT Goals (current goals can now be found in the care plan section)  Acute Rehab PT Goals Patient Stated Goal: return home PT Goal Formulation: With patient/family Time For Goal Achievement: 08/30/20 Potential to Achieve Goals: Good Progress towards PT goals: Progressing toward goals    Frequency    Min 3X/week      PT Plan Discharge plan needs to be updated    Co-evaluation              AM-PAC PT "6 Clicks" Mobility   Outcome Measure  Help needed turning from your back to your side while in a flat bed without using bedrails?: A Little Help needed moving from lying on your back to sitting on the side of a flat bed without using bedrails?: A Little Help needed moving to and from a bed to a chair (including a wheelchair)?: A Lot Help needed standing up from a chair using your arms (e.g., wheelchair or bedside chair)?: A Lot Help needed to walk in hospital room?: Total Help needed climbing 3-5 steps with a railing? : Total 6 Click Score: 12    End of Session Equipment Utilized During Treatment: Oxygen Activity Tolerance: Patient tolerated treatment well;Other (comment) (session time limited due to elevated BP and pt wanting bath due to incontinence) Patient left: in bed;with call bell/phone within reach;with bed alarm set Nurse Communication: Mobility status;Other (comment) (pt had episode of incontinence, BP elevated supine and seated postures) PT Visit Diagnosis: Unsteadiness on feet (R26.81);Other abnormalities of gait and mobility (R26.89);Difficulty in walking, not elsewhere classified (R26.2);Pain Pain - Right/Left: Left Pain - part of body:  (groin)     Time: 3716-9678 PT Time Calculation (min) (ACUTE ONLY): 16 min  Charges:  $Therapeutic Activity: 8-22 mins                     Maxene Byington P., PTA Acute Rehabilitation Services Pager: [phone] Office: [place] 08/19/2020, 2:15 PM

## 2020-08-19 NOTE — Progress Notes (Signed)
  Progress Note    08/19/2020 7:56 AM 5 Days Post-Op  Subjective:  Doing well. Says that her heel is better now. Really wants to go home   Vitals:   08/19/20 0357 08/19/20 0731  BP: 130/68 139/79  Pulse: 97 93  Resp: 20 20  Temp: 98.3 F (36.8 C) 98.5 F (36.9 C)  SpO2: 93% 99%   Physical Exam: Cardiac: regular Lungs:  Non labored Incisions:  Left groin incision with clean, dry dressings. Dressings just changed Extremities:  Left leg well perfused and warm. Left monophasic doppler signal Neurologic: alert and oriented  CBC    Component Value Date/Time   WBC 7.8 08/19/2020 0236   RBC 3.64 (L) 08/19/2020 0236   HGB 10.6 (L) 08/19/2020 0236   HCT 32.9 (L) 08/19/2020 0236   PLT 205 08/19/2020 0236   MCV 90.4 08/19/2020 0236   MCH 29.1 08/19/2020 0236   MCHC 32.2 08/19/2020 0236   RDW 15.9 (H) 08/19/2020 0236   LYMPHSABS 0.5 (L) 08/15/2020 0249   MONOABS 0.8 08/15/2020 0249   EOSABS 0.0 08/15/2020 0249   BASOSABS 0.0 08/15/2020 0249    BMET    Component Value Date/Time   NA 136 08/19/2020 0236   K 3.7 08/19/2020 0236   CL 103 08/19/2020 0236   CO2 27 08/19/2020 0236   GLUCOSE 91 08/19/2020 0236   BUN 5 (L) 08/19/2020 0236   CREATININE 0.48 08/19/2020 0236   CALCIUM 7.6 (L) 08/19/2020 0236   GFRNONAA >60 08/19/2020 0236   GFRAA >60 12/31/2015 0820    INR    Component Value Date/Time   INR 1.2 08/15/2020 0249     Intake/Output Summary (Last 24 hours) at 08/19/2020 0756 Last data filed at 08/19/2020 0602 Gross per 24 hour  Intake 118 ml  Output 1675 ml  Net -1557 ml     Assessment/Plan:  75 y.o. female is s/p Repair of left femoral false aneurysm with interposition of 63 mmPTFEfrom old left limb of aortofemoral graft to profundus femoris bypass. Excision of nonviable skin left groin   5 Days Post-Op. She is doing well post op. Hemodynamically stable. Wound Cx negative. Incision dressings dry. She is stable for discharge from vascular standpoint. Follow  up has been arranged in 2-3 weeks   Karoline Caldwell, PA-C [facility] [phone] 08/19/2020 7:56 AM

## 2020-08-19 NOTE — Discharge Summary (Signed)
Physician Discharge Summary  Heather Jimenez KZS:[id number] DOB: 1945-11-27 DOA: 08/14/2020  PCP: Cher Nakai, MD  Admit date: 08/14/2020 Discharge date: 08/19/2020  Admitted From: Home Disposition: Home  Recommendations for Outpatient Follow-up:  1. Follow up with PCP in 1-2 weeks 2. Please obtain BMP/CBC in one week 3. Please follow up with vascular surgery, pulmonology as scheduled  Home Health: Home health PT Equipment/Devices: Oxygen, 8 L  Discharge Condition: Stable CODE STATUS: Full Diet recommendation:    Brief/Interim Summary: 75 year old woman with hx of PVD s/p fem-fram, aortofem bypass, left femoral pseudoaneurysm, right AKA, smoker, SCC of hypopharynx post laryngectomy. She unfortunately developed left groin bleeding profuse at home, passed out and was brought to ER. She underwent emergent left femoral false aneurysm repair with dacron placement from old left limb of aortofem graft to profundus femoris bypass on 6/1 with Dr. Donnetta Hutching.Post-operatively she remained on the ventilator and required vasopressors. PCCM was consulted for ICU management post operatively.  Patient transition to hospitalist team given resolution of hypoxia and control of bleeding.  Acute postop hypoxemic respiratory failure secondary to aspiration pneumonitis versus pneumonia, resolving S/p laryngectomy -Continue supplemental oxygen at discharge, follow-up with PCP and pulmonology as scheduled for oxygen weaning  Severe PVD with infected pseudoaneusym status post repair Postoperative oozing Postop shock combination hemorrhageand?septic shock Acute Blood Loss Anemia - Shock likely secondary to volume loss/bleeding and anesthesia/analgesics -no acute infectious process suspected at this point -No further indication for antibiotics  Repair of left femoral faluse aneurysm with graft site - Procedure 08/14/2020 - Management per vascular surgery -outpatient follow-up as per their schedule  Severe  protein caloric malnutrition -Ongoing, continue meal supplementation/protein shakes as tolerated  Hyperglycemia without history of diabetes, A1c 5.5 Likely postop/acute phase reactant/stress-induced Patient had multiple episodes of hypoglycemia postprocedure likely due to poor p.o. intake, resolved with increased p.o. intake.  Discharge Diagnoses:  Active Problems:   Femoral artery aneurysm (HCC)   Aneurysm (HCC)   Hemorrhagic shock (HCC)   Acute respiratory failure with hypoxia (HCC)   Acute blood loss anemia    Discharge Instructions  Discharge Instructions    Call MD for:  redness, tenderness, or signs of infection (pain, swelling, redness, odor or green/yellow discharge around incision site)   Complete by: As directed    Call MD for:  severe uncontrolled pain   Complete by: As directed    Call MD for:  temperature >100.4   Complete by: As directed    Diet - low sodium heart healthy   Complete by: As directed    Discharge wound care:   Complete by: As directed    Per vascular surgery   Increase activity slowly   Complete by: As directed      Allergies as of 08/19/2020   No Known Allergies     Medication List    STOP taking these medications   Klor-Con M20 20 MEQ tablet Generic drug: potassium chloride SA   lisinopril 10 MG tablet Commonly known as: ZESTRIL     TAKE these medications   aspirin EC 81 MG tablet Take by mouth.   feeding supplement Liqd Take 237 mLs by mouth 2 (two) times daily between meals.   gabapentin 400 MG capsule Commonly known as: NEURONTIN Take 400 mg by mouth 3 (three) times daily.   senna-docusate 8.6-50 MG tablet Commonly known as: Senokot-S Take 1 tablet by mouth at bedtime as needed for mild constipation.  Durable Medical Equipment  (From admission, onward)         Start     Ordered   08/19/20 1046  DME Oxygen  Once       Question Answer Comment  Length of Need 6 Months   Mode or (Route) Nasal cannula    Liters per Minute 4   Frequency Continuous (stationary and portable oxygen unit needed)   Oxygen delivery system Gas      08/19/20 1048           Discharge Care Instructions  (From admission, onward)         Start     Ordered   08/19/20 0000  Discharge wound care:       Comments: Per vascular surgery   08/19/20 1048          Follow-up Information    [facility] Follow up today.   Why: 2-3 weeks. The office will contact the patient with appointment(sent) Contact information: [address] [place] [facility] Follow up.   Why: home 02 arranged- transport tank to be delivered to room to use for transporting home prior to disharge. Contact information: [place] [address] [phone]              No Known Allergies  Consultations:  Vascular surgery, pulmonology   Procedures/Studies: CT ABDOMEN PELVIS W CONTRAST  Result Date: 08/14/2020 CLINICAL DATA:  Left lower quadrant abdominal pain. EXAM: CT ABDOMEN AND PELVIS WITH CONTRAST TECHNIQUE: Multidetector CT imaging of the abdomen and pelvis was performed using the standard protocol following bolus administration of intravenous contrast. CONTRAST:  63mL OMNIPAQUE IOHEXOL 300 MG/ML  SOLN COMPARISON:  December 29, 2015. FINDINGS: Lower chest: No acute abnormality. Hepatobiliary: No focal liver abnormality is seen. No gallstones, gallbladder wall thickening, or biliary dilatation. Pancreas: Unremarkable. No pancreatic ductal dilatation or surrounding inflammatory changes. Spleen: Normal in size without focal abnormality. Adrenals/Urinary Tract: Adrenal glands appear normal. Left renal atrophy is noted. No hydronephrosis or renal obstruction is noted. Urinary bladder is unremarkable. Stomach/Bowel: Stomach is within normal limits. Appendix appears normal. No evidence of bowel wall thickening, distention, or inflammatory changes.  Vascular/Lymphatic: Status post aortobifemoral graft placement. There is occlusion of the right limb of the graft as well as occlusion of the right common, internal and external iliac arteries. Left common femoral artery pseudoaneurysm is again noted measuring 4 x 3 cm which is significantly smaller compared to prior exam. No significant adenopathy is noted. There is interval development of what appears to be 3.8 x 2.5 cm saccular aneurysm formation involving the excluded aneurysmal sac at the level of the aortic bifurcation. It is not seen to fill with contrast. Reproductive: Uterus and bilateral adnexa are unremarkable. Other: No abdominal wall hernia or abnormality. No abdominopelvic ascites. Musculoskeletal: No acute or significant osseous findings. IMPRESSION: Left renal atrophy. Status post aortobifemoral graft placement. Occlusion of the right limb of the graft is noted as well as chronic occlusion of the right common, internal and external iliac arteries. There is interval development of 3.8 x 2.5 cm probable saccular aneurysm involving the excluded aneurysmal sac at the level of aortic bifurcation. It is not seen to fill with contrast. Left common femoral artery pseudoaneurysm noted on prior exam is significantly smaller currently, measuring 4 x 3 cm. Electronically Signed   By: Marijo Conception M.D.   On: 08/14/2020 13:40   DG  CHEST PORT 1 VIEW  Result Date: 08/15/2020 CLINICAL DATA:  75 year old female status post surgery. Follow-up study. EXAM: PORTABLE CHEST 1 VIEW COMPARISON:  Chest x-ray 08/14/2020. FINDINGS: There is a right-sided internal jugular central venous catheter with tip terminating in the mid superior vena cava. Extensive airspace consolidation noted throughout the left mid to lower lung. Small to moderate left and small right pleural effusions. Basilar opacities on the right may reflect additional areas of atelectasis and/or consolidation. No pneumothorax. No evidence of pulmonary edema.  Heart size is normal. Mediastinal contours are distorted by patient's rotation to the left. Atherosclerosis in the thoracic aorta. Surgical clips project over the left axillary region. IMPRESSION: 1. Extensive airspace consolidation throughout the left mid to lower lung compatible with pneumonia. Additional areas of developing atelectasis and/or consolidation in the right lung base now noted. Increasing small to moderate left and small right pleural effusions. 2. Aortic atherosclerosis. Electronically Signed   By: Vinnie Langton M.D.   On: 08/15/2020 05:19   DG CHEST PORT 1 VIEW  Result Date: 08/14/2020 CLINICAL DATA:  Central line placement EXAM: PORTABLE CHEST 1 VIEW COMPARISON:  08/14/2020 FINDINGS: There is now a large bore catheter from a left approach with tip projecting in the lower SVC. Right IJ central venous catheter tip projects in the mid SVC. There are new opacities in the left lung, likely pulmonary edema. There is now mild interstitial opacity within the right lung. No pneumothorax. IMPRESSION: Right IJ approach central venous catheter tip in the mid SVC. Left approach large bore catheter tip projects in the lower SVC. No pneumothorax. Electronically Signed   By: Ulyses Jarred M.D.   On: 08/14/2020 19:03   DG Chest Port 1 View  Result Date: 08/14/2020 CLINICAL DATA:  75 year old female with history of shortness of breath. EXAM: PORTABLE CHEST 1 VIEW COMPARISON:  Chest x-ray 12/30/2015. FINDINGS: Lung volumes are normal. No consolidative airspace disease. No pleural effusions. No pneumothorax. Chronic interstitial prominence and architectural distortion in the medial aspect of the apex of the right upper lobe, similar to numerous prior studies, most compatible with chronic post infectious or inflammatory scarring. No evidence of pulmonary edema. Heart size is normal. Upper mediastinal contours are within normal limits. Aortic atherosclerosis. Surgical clips project over the apex of the left  hemithorax and the left axillary region. IMPRESSION: 1. No radiographic evidence of acute cardiopulmonary disease. 2. Aortic atherosclerosis. 3. Probable chronic post infectious or inflammatory scarring in the apex of the right upper lobe, as above. Electronically Signed   By: Vinnie Langton M.D.   On: 08/14/2020 12:01     Subjective: No acute issues or events overnight, patient remains somewhat hypoxic with exertion as well as at rest -but does continue to improve daily, otherwise stable and agreeable for discharge home on oxygen supplementation to be weaned down by PCP and pulmonology per their schedule  Discharge Exam: Vitals:   08/19/20 1506 08/19/20 1600  BP:  140/80  Pulse: 100 99  Resp: 18 18  Temp:  98.8 F (37.1 C)  SpO2: 100% 98%   Vitals:   08/19/20 1103 08/19/20 1200 08/19/20 1506 08/19/20 1600  BP: 132/76 (!) 143/78  140/80  Pulse: (!) 104 96 100 99  Resp: 18 18 18 18   Temp:  98.8 F (37.1 C)  98.8 F (37.1 C)  TempSrc:  Oral  Oral  SpO2: 94% 98% 100% 98%  Weight:      Height:        General: Awake  alert oriented no acute distress, resting comfortably in bed HEENT: MM pink/moist, anicteric, laryngectomy site open without purulence or bleeding Neuro: Without overt deficits CV: S1S2, regular rate and rhythm no murmurs rubs or gallops PULM: Clear to auscultate bilaterally without overt wheezes rales or rhonchi GI: soft, bsx4 active, nontender Renal: Foley in place, urine clear, yellow Extremities: Strength 5 out of 5 globally without overt deficit Skin:Surgical groin dressing clean dry intact    The results of significant diagnostics from this hospitalization (including imaging, microbiology, ancillary and laboratory) are listed below for reference.     Microbiology: Recent Results (from the past 240 hour(s))  Aerobic/Anaerobic Culture w Gram Stain (surgical/deep wound)     Status: None   Collection Time: 08/14/20  4:01 PM   Specimen: PATH Soft tissue   Result Value Ref Range Status   Specimen Description TISSUE LEFT FEMORAL PERIGRAFT SPEC A  Final   Special Requests LEFT FEMORAL PERIGRAFT  Final   Gram Stain   Final    RARE WBC PRESENT, PREDOMINANTLY MONONUCLEAR NO ORGANISMS SEEN    Culture   Final    No growth aerobically or anaerobically. Performed at [facility], [place] [address] [place], [place] [address]    Report Status 08/19/2020 FINAL  Final  MRSA PCR Screening     Status: None   Collection Time: 08/14/20  6:44 PM   Specimen: Nasopharyngeal  Result Value Ref Range Status   MRSA by PCR NEGATIVE NEGATIVE Final    Comment:        The GeneXpert MRSA Assay (FDA approved for NASAL specimens only), is one component of a comprehensive MRSA colonization surveillance program. It is not intended to diagnose MRSA infection nor to guide or monitor treatment for MRSA infections. Performed at [facility], [place] [address] [place], [place] [address]   Culture, blood (routine x 2)     Status: None (Preliminary result)   Collection Time: 08/14/20  7:11 PM   Specimen: BLOOD  Result Value Ref Range Status   Specimen Description BLOOD SITE NOT SPECIFIED  Final   Special Requests   Final    BOTTLES DRAWN AEROBIC AND ANAEROBIC Blood Culture results may not be optimal due to an inadequate volume of blood received in culture bottles   Culture   Final    NO GROWTH 4 DAYS Performed at [facility], [place] [address], [place], [place] [address]    Report Status PENDING  Incomplete  Culture, blood (routine x 2)     Status: None (Preliminary result)   Collection Time: 08/14/20  7:50 PM   Specimen: BLOOD  Result Value Ref Range Status   Specimen Description BLOOD LEFT HAND  Final   Special Requests   Final    BOTTLES DRAWN AEROBIC ONLY Blood Culture results may not be optimal due to an inadequate volume of blood received in culture bottles   Culture   Final    NO GROWTH 4 DAYS Performed at [facility], [place] [address], [place], [place] [address]    Report Status PENDING  Incomplete  Anaerobic culture w Gram Stain     Status: None (Preliminary result)   Collection Time: 08/14/20  8:51 PM   Specimen: PATH Soft tissue  Result Value Ref Range Status   Specimen Description TRACHEAL ASPIRATE  Final   Special Requests NONE  Final   Gram Stain   Final    RARE WBC PRESENT,BOTH PMN AND MONONUCLEAR NO  ORGANISMS SEEN Performed at [facility], [place] [address], [place], [place] [address]    Culture   Final    NO ANAEROBES ISOLATED; CULTURE IN PROGRESS FOR 5 DAYS   Report Status PENDING  Incomplete  Culture, Respiratory w Gram Stain     Status: None   Collection Time: 08/14/20  8:51 PM   Specimen: Tracheal Aspirate; Respiratory  Result Value Ref Range Status   Specimen Description TRACHEAL ASPIRATE  Final   Special Requests NONE  Final   Gram Stain   Final    RARE WBC PRESENT, PREDOMINANTLY PMN NO ORGANISMS SEEN    Culture   Final    RARE Normal respiratory flora-no Staph aureus or Pseudomonas seen Performed at [place] [address] [place], [place] [address]    Report Status 08/17/2020 FINAL  Final  Resp Panel by RT-PCR (Flu A&B, Covid) Nasopharyngeal Swab     Status: None   Collection Time: 08/15/20  9:14 AM   Specimen: Nasopharyngeal Swab; Nasopharyngeal(NP) swabs in vial transport medium  Result Value Ref Range Status   SARS Coronavirus 2 by RT PCR NEGATIVE NEGATIVE Final    Comment: (NOTE) SARS-CoV-2 target nucleic acids are NOT DETECTED.  The SARS-CoV-2 RNA is generally detectable in upper respiratory specimens during the acute phase of infection. The lowest concentration of SARS-CoV-2 viral copies this assay can detect is 138 copies/mL. A negative result does not preclude SARS-Cov-2 infection and should not be used as the sole basis for treatment or other patient management decisions. A negative result may occur with  improper specimen collection/handling, submission  of specimen other than nasopharyngeal swab, presence of viral mutation(s) within the areas targeted by this assay, and inadequate number of viral copies(<138 copies/mL). A negative result must be combined with clinical observations, patient history, and epidemiological information. The expected result is Negative.  Fact Sheet for Patients:  EntrepreneurPulse.com.au  Fact Sheet for Healthcare Providers:  IncredibleEmployment.be  This test is no t yet approved or cleared by the [place] FDA and  has been authorized for detection and/or diagnosis of SARS-CoV-2 by FDA under an Emergency Use Authorization (EUA). This EUA will remain  in effect (meaning this test can be used) for the duration of the COVID-19 declaration under Section 564(b)(1) of the Act, 21 U.S.C.section 360bbb-3(b)(1), unless the authorization is terminated  or revoked sooner.       Influenza A by PCR NEGATIVE NEGATIVE Final   Influenza B by PCR NEGATIVE NEGATIVE Final    Comment: (NOTE) The Xpert Xpress SARS-CoV-2/FLU/RSV plus assay is intended as an aid in the diagnosis of influenza from Nasopharyngeal swab specimens and should not be used as a sole basis for treatment. Nasal washings and aspirates are unacceptable for Xpert Xpress SARS-CoV-2/FLU/RSV testing.  Fact Sheet for Patients: EntrepreneurPulse.com.au  Fact Sheet for Healthcare Providers: IncredibleEmployment.be  This test is not yet approved or cleared by the [place] FDA and has been authorized for detection and/or diagnosis of SARS-CoV-2 by FDA under an Emergency Use Authorization (EUA). This EUA will remain in effect (meaning this test can be used) for the duration of the COVID-19 declaration under Section 564(b)(1) of the Act, 21 U.S.C. section 360bbb-3(b)(1), unless the authorization is terminated or revoked.  Performed at [facility], [place] [address]  [place], [place] [address]      Labs: BNP (last 3 results) No results for input(s): BNP in the last 8760 hours. Basic Metabolic Panel: Recent Labs  Lab  08/14/20 1844 08/15/20 0249 08/16/20 0222 08/17/20 0035 08/18/20 0059 08/19/20 0236  NA 138  136 135 135 136 135 136  K 3.5  3.3* 4.4 3.5 3.3* 3.6 3.7  CL 99 103 103 105 105 103  CO2 19* 22 25 24 23 27   GLUCOSE 192* 207* 124* 69* 81 91  BUN 6* 7* 7* 7* 7* 5*  CREATININE 0.68 0.79 0.81 0.64 0.55 0.48  CALCIUM 8.3* 8.2* 8.0* 7.3* 7.3* 7.6*  MG 1.3* 1.7 1.8  --   --   --   PHOS 5.0*  --  2.3*  --   --   --    Liver Function Tests: Recent Labs  Lab 08/15/20 0249  AST 18  ALT 7  ALKPHOS 42  BILITOT 1.4*  PROT 4.9*  ALBUMIN 3.2*   No results for input(s): LIPASE, AMYLASE in the last 168 hours. No results for input(s): AMMONIA in the last 168 hours. CBC: Recent Labs  Lab 08/14/20 1123 08/14/20 1213 08/15/20 0249 08/16/20 0222 08/17/20 0035 08/18/20 0059 08/19/20 0236  WBC 17.1*   < > 12.0* 11.3* 9.7 8.1 7.8  NEUTROABS 14.7*  --  10.6*  --   --   --   --   HGB 9.6*   < > 11.5* 10.1* 10.8* 11.2* 10.6*  HCT 30.9*   < > 34.8* 30.1* 32.9* 34.5* 32.9*  MCV 91.2   < > 88.1 87.5 90.4 91.0 90.4  PLT 232   < > 156 135* 140* 143* 205   < > = values in this interval not displayed.   Cardiac Enzymes: No results for input(s): CKTOTAL, CKMB, CKMBINDEX, TROPONINI in the last 168 hours. BNP: Invalid input(s): POCBNP CBG: Recent Labs  Lab 08/18/20 2349 08/19/20 0439 08/19/20 0728 08/19/20 1159 08/19/20 1636  GLUCAP 119* 94 102* 179* 108*   D-Dimer No results for input(s): DDIMER in the last 72 hours. Hgb A1c No results for input(s): HGBA1C in the last 72 hours. Lipid Profile No results for input(s): CHOL, HDL, LDLCALC, TRIG, CHOLHDL, LDLDIRECT in the last 72 hours. Thyroid function studies No results for input(s): TSH, T4TOTAL, T3FREE, THYROIDAB in the last 72 hours.  Invalid input(s): FREET3 Anemia work up No  results for input(s): VITAMINB12, FOLATE, FERRITIN, TIBC, IRON, RETICCTPCT in the last 72 hours. Urinalysis    Component Value Date/Time   COLORURINE STRAW (A) 08/14/2020 2051   APPEARANCEUR CLEAR 08/14/2020 2051   LABSPEC 1.018 08/14/2020 2051   PHURINE 6.0 08/14/2020 2051   GLUCOSEU 150 (A) 08/14/2020 2051   HGBUR SMALL (A) 08/14/2020 2051   BILIRUBINUR NEGATIVE 08/14/2020 2051   KETONESUR 20 (A) 08/14/2020 2051   PROTEINUR NEGATIVE 08/14/2020 2051   UROBILINOGEN 1.0 12/20/2009 1951   NITRITE NEGATIVE 08/14/2020 2051   LEUKOCYTESUR LARGE (A) 08/14/2020 2051   Sepsis Labs Invalid input(s): PROCALCITONIN,  WBC,  LACTICIDVEN Microbiology Recent Results (from the past 240 hour(s))  Aerobic/Anaerobic Culture w Gram Stain (surgical/deep wound)     Status: None   Collection Time: 08/14/20  4:01 PM   Specimen: PATH Soft tissue  Result Value Ref Range Status   Specimen Description TISSUE LEFT FEMORAL PERIGRAFT SPEC A  Final   Special Requests LEFT FEMORAL PERIGRAFT  Final   Gram Stain   Final    RARE WBC PRESENT, PREDOMINANTLY MONONUCLEAR NO ORGANISMS SEEN    Culture   Final    No growth aerobically or anaerobically. Performed at [facility], [place] [address], [place], [place] [address]  Report Status 08/19/2020 FINAL  Final  MRSA PCR Screening     Status: None   Collection Time: 08/14/20  6:44 PM   Specimen: Nasopharyngeal  Result Value Ref Range Status   MRSA by PCR NEGATIVE NEGATIVE Final    Comment:        The GeneXpert MRSA Assay (FDA approved for NASAL specimens only), is one component of a comprehensive MRSA colonization surveillance program. It is not intended to diagnose MRSA infection nor to guide or monitor treatment for MRSA infections. Performed at [facility], [place] [address], [place], [place] [address]   Culture, blood (routine x 2)     Status: None (Preliminary result)   Collection Time: 08/14/20  7:11 PM   Specimen: BLOOD  Result Value  Ref Range Status   Specimen Description BLOOD SITE NOT SPECIFIED  Final   Special Requests   Final    BOTTLES DRAWN AEROBIC AND ANAEROBIC Blood Culture results may not be optimal due to an inadequate volume of blood received in culture bottles   Culture   Final    NO GROWTH 4 DAYS Performed at [facility], [place] [address], [place], [place] [address]    Report Status PENDING  Incomplete  Culture, blood (routine x 2)     Status: None (Preliminary result)   Collection Time: 08/14/20  7:50 PM   Specimen: BLOOD  Result Value Ref Range Status   Specimen Description BLOOD LEFT HAND  Final   Special Requests   Final    BOTTLES DRAWN AEROBIC ONLY Blood Culture results may not be optimal due to an inadequate volume of blood received in culture bottles   Culture   Final    NO GROWTH 4 DAYS Performed at [facility], [place] [address], [place], [place] [address]    Report Status PENDING  Incomplete  Anaerobic culture w Gram Stain     Status: None (Preliminary result)   Collection Time: 08/14/20  8:51 PM   Specimen: PATH Soft tissue  Result Value Ref Range Status   Specimen Description TRACHEAL ASPIRATE  Final   Special Requests NONE  Final   Gram Stain   Final    RARE WBC PRESENT,BOTH PMN AND MONONUCLEAR NO ORGANISMS SEEN Performed at [facility], [place] [address], [place], [place] [address]    Culture   Final    NO ANAEROBES ISOLATED; CULTURE IN PROGRESS FOR 5 DAYS   Report Status PENDING  Incomplete  Culture, Respiratory w Gram Stain     Status: None   Collection Time: 08/14/20  8:51 PM   Specimen: Tracheal Aspirate; Respiratory  Result Value Ref Range Status   Specimen Description TRACHEAL ASPIRATE  Final   Special Requests NONE  Final   Gram Stain   Final    RARE WBC PRESENT, PREDOMINANTLY PMN NO ORGANISMS SEEN    Culture   Final    RARE Normal respiratory flora-no Staph aureus or Pseudomonas seen Performed at [place] [address] [place],  [place] [address]    Report Status 08/17/2020 FINAL  Final  Resp Panel by RT-PCR (Flu A&B, Covid) Nasopharyngeal Swab     Status: None   Collection Time: 08/15/20  9:14 AM   Specimen: Nasopharyngeal Swab; Nasopharyngeal(NP) swabs in vial transport medium  Result Value Ref Range Status   SARS Coronavirus 2 by RT PCR NEGATIVE NEGATIVE Final    Comment: (NOTE) SARS-CoV-2 target nucleic acids are NOT DETECTED.  The SARS-CoV-2 RNA is generally detectable in upper respiratory specimens during the acute phase of infection. The lowest concentration of SARS-CoV-2 viral copies this assay can detect is 138 copies/mL. A negative result does not preclude SARS-Cov-2 infection and should not be used as the sole basis for treatment or other patient management decisions. A negative result may occur with  improper specimen collection/handling, submission of specimen other than nasopharyngeal swab, presence of viral mutation(s) within the areas targeted by this assay, and inadequate number of viral copies(<138 copies/mL). A negative result must be combined with clinical observations, patient history, and epidemiological information. The expected result is Negative.  Fact Sheet for Patients:  EntrepreneurPulse.com.au  Fact Sheet for Healthcare Providers:  IncredibleEmployment.be  This test is no t yet approved or cleared by the [place] FDA and  has been authorized for detection and/or diagnosis of SARS-CoV-2 by FDA under an Emergency Use Authorization (EUA). This EUA will remain  in effect (meaning this test can be used) for the duration of the COVID-19 declaration under Section 564(b)(1) of the Act, 21 U.S.C.section 360bbb-3(b)(1), unless the authorization is terminated  or revoked sooner.       Influenza A by PCR NEGATIVE NEGATIVE Final   Influenza B by PCR NEGATIVE NEGATIVE Final    Comment: (NOTE) The Xpert Xpress SARS-CoV-2/FLU/RSV plus assay is intended as  an aid in the diagnosis of influenza from Nasopharyngeal swab specimens and should not be used as a sole basis for treatment. Nasal washings and aspirates are unacceptable for Xpert Xpress SARS-CoV-2/FLU/RSV testing.  Fact Sheet for Patients: EntrepreneurPulse.com.au  Fact Sheet for Healthcare Providers: IncredibleEmployment.be  This test is not yet approved or cleared by the [place] FDA and has been authorized for detection and/or diagnosis of SARS-CoV-2 by FDA under an Emergency Use Authorization (EUA). This EUA will remain in effect (meaning this test can be used) for the duration of the COVID-19 declaration under Section 564(b)(1) of the Act, 21 U.S.C. section 360bbb-3(b)(1), unless the authorization is terminated or revoked.  Performed at [facility], [place] [address], [place], [place] [address]      Time coordinating discharge: Over 30 minutes  SIGNED:   Little Ishikawa, DO [facility] 08/19/2020, 6:49 PM Pager   If 7PM-7AM, please contact night-coverage [email or web address]

## 2020-08-19 NOTE — Progress Notes (Signed)
  Speech Language Pathology Treatment: Dysphagia  Patient Details Name: Heather Jimenez MRN: [id number] DOB: 1946/01/27 Today's Date: 08/19/2020 Time: 9570-2202 SLP Time Calculation (min) (ACUTE ONLY): 21 min  Assessment / Plan / Recommendation Clinical Impression  Pt now has both her upper and lower dentures and although she does not like that taste of the food, she denies any difficulties with swallowing it. Pt consumed solids, liquids, and purees today with reduced anterior loss observed, especially when she was utilizing a straw. Education was reinforced about positioning and keeping her stoma protected. She does say that she uses covers at home, but here she has been utilizing wash cloths more. Will continue regular solids and thin liquids - would crush pills in pudding as is her preference. SLP to sign off acutely.    HPI HPI: Pt is a 75 yo female presenting with L groin bleeding s/p emergent L femoral false aneurysm repair. Post-op course complicated by PNA. Pt with a h/o total laryngectomy 5 years ago due to [facility] of hypopharynx so SLP was ordered to address laryngectomy needs and swallowing. PMH also includes: PVD s/p fem-fram, aortofem bypass, left femoral pseudoaneurysm, right AKA, smoker, former PEG use during cancer tx but since removed      SLP Plan  All goals met       Recommendations  Diet recommendations: Regular;Thin liquid Liquids provided via: Cup;Straw Medication Administration: Crushed with puree (per pt preference - crushed with pudding) Supervision: Patient able to self feed;Intermittent supervision to cue for compensatory strategies Compensations: Slow rate;Small sips/bites;Monitor for anterior loss Postural Changes and/or Swallow Maneuvers: Seated upright 90 degrees                Oral Care Recommendations: Oral care BID Follow up Recommendations: None SLP Visit Diagnosis: Dysphagia, oral phase (R13.11) Plan: All goals met       GO                 Osie Bond., M.A. [facility] Pager [phone] Office [phone]  08/19/2020, 10:50 AM

## 2020-08-19 NOTE — Progress Notes (Signed)
Discharge instructions (including medications) discussed with and copy provided to patient/caregiver 

## 2020-08-19 NOTE — Progress Notes (Signed)
RT NOTES: Pt on 35% ATC. Pt found on 2L flow, supposed to be on 8L flow. Sats 90%. Increased flow to 8L. Sats now 94%.

## 2020-08-19 NOTE — Plan of Care (Signed)
  Problem: Health Behavior/Discharge Planning: Goal: Ability to manage health-related needs will improve Outcome: Adequate for Discharge   

## 2020-08-20 LAB — ANAEROBIC CULTURE W GRAM STAIN

## 2020-08-20 LAB — CULTURE, BLOOD (ROUTINE X 2)
Culture: NO GROWTH
Culture: NO GROWTH

## 2020-08-20 NOTE — TOC Transition Note (Signed)
Transition of Care (TOC) - CM/SW Discharge Note [name] RN, BSN Transitions of Care Unit 4E- RN Case Manager See Treatment Team for direct phone #     Patient Details  Name: Heather Jimenez MRN: [id number] Date of Birth: June 24, 1945  Transition of Care [facility]) CM/SW Contact:  Dawayne Patricia, RN Phone Number: 08/20/2020, 12:22 PM   Clinical Narrative:    Pt stable for transition home today, order placed for home 02, spoke with pt this am regarding home 02 arrangements, pt does not have a preference for provider and is ok using in house provider [employer] for home 02 needs, explained that once processed for insurance, they will bring tanks for transport home. Pt states that she is going home with spouse, family will transport home.  Bedside RN will get in touch with RT for TC adaptor for transport.   Call made to [facility] with [employer] for home 02 referral- transport tanks to be delivered to room prior to discharge.   1430- notified by PT that they have updated recs for HHPT, msg sent to MD for [facility] orders.  Post discharge note: [name] orders placed prior to discharge however after 5pm, call made to pt's spoke 08/20/20 AM post discharge to discuss [facility] and offer choice- per spouse they are not sure about [facility] but willing to try and see how pt does, reviewed [place] list over the phone with spouse, Alvis Lemmings has been selected for [facility] needs.  Call made to [facility] with [facility] for [place] referral- referral has been accepted and they will contact pt for start of care.     Final next level of care: Home/Self Care Barriers to Discharge: No Barriers Identified   Patient Goals and CMS Choice Patient states their goals for this hospitalization and ongoing recovery are:: return home with spouse CMS Medicare.gov Compare Post Acute Care list provided to:: Patient Choice offered to / list presented to : Patient  Discharge Placement                 home with [facility]      Discharge Plan and Services   Discharge  Planning Services: CM Consult Post Acute Care Choice: Durable Medical Equipment          DME Arranged: Oxygen DME Agency: [employer] Date DME Agency Contacted: 08/19/20 Time DME Agency Contacted: [phone] Representative spoke with at DME Agency: Thedore Mins HH Arranged: PT [place]: [place] Date [place]: 08/20/20 Time [place]: 1222 Representative spoke with at [place]: [place]) Interventions     Readmission Risk Interventions Readmission Risk Prevention Plan 08/19/2020  Post Dischage Appt Complete  Medication Screening Complete  Transportation Screening Complete  Some recent data might be hidden

## 2020-08-21 DIAGNOSIS — J9621 Acute and chronic respiratory failure with hypoxia: Secondary | ICD-10-CM | POA: Diagnosis not present

## 2020-08-21 DIAGNOSIS — I509 Heart failure, unspecified: Secondary | ICD-10-CM | POA: Diagnosis not present

## 2020-08-21 DIAGNOSIS — J449 Chronic obstructive pulmonary disease, unspecified: Secondary | ICD-10-CM | POA: Diagnosis not present

## 2020-08-21 DIAGNOSIS — J9811 Atelectasis: Secondary | ICD-10-CM | POA: Diagnosis not present

## 2020-08-21 DIAGNOSIS — E876 Hypokalemia: Secondary | ICD-10-CM | POA: Diagnosis not present

## 2020-08-21 DIAGNOSIS — E43 Unspecified severe protein-calorie malnutrition: Secondary | ICD-10-CM | POA: Diagnosis not present

## 2020-08-21 DIAGNOSIS — Z681 Body mass index (BMI) 19 or less, adult: Secondary | ICD-10-CM | POA: Diagnosis not present

## 2020-08-21 DIAGNOSIS — I2510 Atherosclerotic heart disease of native coronary artery without angina pectoris: Secondary | ICD-10-CM | POA: Diagnosis not present

## 2020-08-21 DIAGNOSIS — J90 Pleural effusion, not elsewhere classified: Secondary | ICD-10-CM | POA: Diagnosis not present

## 2020-08-21 DIAGNOSIS — I5031 Acute diastolic (congestive) heart failure: Secondary | ICD-10-CM | POA: Diagnosis not present

## 2020-08-21 DIAGNOSIS — E7800 Pure hypercholesterolemia, unspecified: Secondary | ICD-10-CM | POA: Diagnosis not present

## 2020-08-21 DIAGNOSIS — I700 Atherosclerosis of aorta: Secondary | ICD-10-CM | POA: Diagnosis not present

## 2020-08-21 DIAGNOSIS — I361 Nonrheumatic tricuspid (valve) insufficiency: Secondary | ICD-10-CM | POA: Diagnosis not present

## 2020-08-21 DIAGNOSIS — R0602 Shortness of breath: Secondary | ICD-10-CM | POA: Diagnosis not present

## 2020-08-21 DIAGNOSIS — I739 Peripheral vascular disease, unspecified: Secondary | ICD-10-CM | POA: Diagnosis not present

## 2020-08-22 ENCOUNTER — Other Ambulatory Visit: Payer: Self-pay

## 2020-08-22 DIAGNOSIS — I509 Heart failure, unspecified: Secondary | ICD-10-CM | POA: Diagnosis not present

## 2020-08-22 DIAGNOSIS — I779 Disorder of arteries and arterioles, unspecified: Secondary | ICD-10-CM

## 2020-08-22 DIAGNOSIS — I739 Peripheral vascular disease, unspecified: Secondary | ICD-10-CM | POA: Diagnosis not present

## 2020-08-23 DIAGNOSIS — I509 Heart failure, unspecified: Secondary | ICD-10-CM | POA: Diagnosis not present

## 2020-08-23 DIAGNOSIS — I739 Peripheral vascular disease, unspecified: Secondary | ICD-10-CM | POA: Diagnosis not present

## 2020-09-06 ENCOUNTER — Ambulatory Visit (INDEPENDENT_AMBULATORY_CARE_PROVIDER_SITE_OTHER): Payer: Medicare PPO | Admitting: Physician Assistant

## 2020-09-06 ENCOUNTER — Ambulatory Visit (HOSPITAL_COMMUNITY)
Admission: RE | Admit: 2020-09-06 | Discharge: 2020-09-06 | Disposition: A | Discharge status: Home / Self Care | Payer: Medicare PPO | Source: Ambulatory Visit | Attending: Vascular Surgery | Admitting: Vascular Surgery

## 2020-09-06 ENCOUNTER — Other Ambulatory Visit: Payer: Self-pay

## 2020-09-06 VITALS — BP 109/69 | HR 89 | Temp 98.6°F | Resp 20 | Ht 63.0 in | Wt 80.0 lb

## 2020-09-06 DIAGNOSIS — I779 Disorder of arteries and arterioles, unspecified: Secondary | ICD-10-CM

## 2020-09-06 NOTE — Progress Notes (Signed)
    Postoperative Visit   History of Present Illness   Heather Jimenez is a 75 y.o. year old female who presents for postoperative follow-up for: Repair of left femoral pseudoaneurysm with interposition PTFE graft from old left limb of aortobifemoral graft to profundofemoral bypass and excision of nonviable skin by Dr. Donnetta Hutching and Dr. Carlis Abbott on 08/14/2020.  Patient is here with her husband.  They state that the incision seems to be healed up completely.  She denies any drainage or bleeding from the incision.  She denies any rest pain or nonhealing wounds in her left foot.  Surgical history also significant for right above-the-knee amputation.  For VQI Use Only   PRE-ADM LIVING: Home  AMB STATUS: Ambulatory with Assistance   Physical Examination   Vitals:   09/06/20 1435  BP: 109/69  Pulse: 89  Resp: 20  Temp: 98.6 F (37 C)  TempSrc: Temporal  SpO2: 97%  Weight: 80 lb (36.3 kg)  Height: 5\' 3"  (1.6 m)    LLE: Left groin incision with sutures intact, nearly healed; distal pole fluid collection with minimal thin drainage with manipulation; left foot warm and well-perfused  Medical Decision Making   Heather Jimenez is a 75 y.o. year old female who presents 3 weeks s/p repair of left groin pseudoaneurysm with interposition PTFE graft from the old aortofemoral limb to the profunda artery bypass  Left foot warm and well-perfused Left groin incision nearly healed; all sutures were removed; small fluid collection at the distal pole of the incision with minimal thin drainage with manipulation Recommended twice daily cleansing of groin and incision with soap and water followed by keeping a dry gauze in the groin fold at all times to be changed as needed Follow-up in 2 to 3 weeks for incision check; call/return office sooner with any questions or concerns   Dagoberto Ligas PA-C [facility] Office: [phone]  Clinic MD: Don[place]etta Matters on call

## 2020-09-09 DIAGNOSIS — E46 Unspecified protein-calorie malnutrition: Secondary | ICD-10-CM | POA: Diagnosis not present

## 2020-09-09 DIAGNOSIS — B359 Dermatophytosis, unspecified: Secondary | ICD-10-CM | POA: Diagnosis not present

## 2020-09-09 DIAGNOSIS — I739 Peripheral vascular disease, unspecified: Secondary | ICD-10-CM | POA: Diagnosis not present

## 2020-09-09 DIAGNOSIS — E871 Hypo-osmolality and hyponatremia: Secondary | ICD-10-CM | POA: Diagnosis not present

## 2020-09-09 DIAGNOSIS — J449 Chronic obstructive pulmonary disease, unspecified: Secondary | ICD-10-CM | POA: Diagnosis not present

## 2020-09-09 DIAGNOSIS — I10 Essential (primary) hypertension: Secondary | ICD-10-CM | POA: Diagnosis not present

## 2020-09-09 DIAGNOSIS — C140 Malignant neoplasm of pharynx, unspecified: Secondary | ICD-10-CM | POA: Diagnosis not present

## 2020-09-09 DIAGNOSIS — I724 Aneurysm of artery of lower extremity: Secondary | ICD-10-CM | POA: Diagnosis not present

## 2020-09-09 DIAGNOSIS — Z89611 Acquired absence of right leg above knee: Secondary | ICD-10-CM | POA: Diagnosis not present

## 2020-09-12 LAB — FUNGUS CULTURE WITH STAIN

## 2020-09-12 LAB — FUNGUS CULTURE RESULT

## 2020-09-12 LAB — FUNGAL ORGANISM REFLEX

## 2020-09-13 LAB — FUNGUS CULTURE WITH STAIN

## 2020-09-13 LAB — FUNGAL ORGANISM REFLEX

## 2020-09-13 LAB — FUNGUS CULTURE RESULT

## 2020-09-18 DIAGNOSIS — R578 Other shock: Secondary | ICD-10-CM | POA: Diagnosis not present

## 2020-09-18 DIAGNOSIS — J9601 Acute respiratory failure with hypoxia: Secondary | ICD-10-CM | POA: Diagnosis not present

## 2020-09-26 DIAGNOSIS — R490 Dysphonia: Secondary | ICD-10-CM | POA: Insufficient documentation

## 2020-09-26 DIAGNOSIS — R1312 Dysphagia, oropharyngeal phase: Secondary | ICD-10-CM | POA: Insufficient documentation

## 2020-09-27 ENCOUNTER — Other Ambulatory Visit: Payer: Self-pay

## 2020-09-27 ENCOUNTER — Ambulatory Visit (INDEPENDENT_AMBULATORY_CARE_PROVIDER_SITE_OTHER): Payer: Medicare PPO | Admitting: Physician Assistant

## 2020-09-27 VITALS — BP 99/72 | HR 95 | Temp 98.2°F | Resp 20 | Ht 63.0 in

## 2020-09-27 DIAGNOSIS — I779 Disorder of arteries and arterioles, unspecified: Secondary | ICD-10-CM

## 2020-09-27 NOTE — Progress Notes (Signed)
  POST OPERATIVE OFFICE NOTE    CC:  F/u for surgery  HPI:  This is a 75 y.o. female who is s/p Repair of left femoral false aneurysm with interposition of 6 mm PTFE from old left limb of aortofemoral graft to profundus femoris bypass.  Excision of nonviable skin left groin. This was performed on 08/14/20 by Dr. Donnetta Hutching Dr. Carlis Abbott. She had initially presented to the [facility] ED with left groin pain and was found in bathroom with a lot of blood on floor by EMS. They placed a stitch in groin to stop bleeding. She was very hypotensive on arrival to ED. She was subsequently transferred to [facility] for management  She returns today for incision check. Patient is here with her husband.  They state that the incision seems to be healed up completely. She was concerned there may still be 1 stitch present, thought maybe she saw piece of it on her Depends the other morning. She denies any drainage or bleeding from the incision.  She denies any rest pain or nonhealing wounds in her left foot.  Surgical history also significant for right above-the-knee amputation. She gets around pretty well independently. She is able to stand and transfer easily on left leg.  The patient has a history of throat cancer and has undergone laryngectomy.  She is on ACE inhibitor for hypertension and an aspirin.   No Known Allergies  Current Outpatient Medications  Medication Sig Dispense Refill   aspirin EC 81 MG tablet Take by mouth.     atorvastatin (LIPITOR) 10 MG tablet Take 1 tablet by mouth daily.     feeding supplement (ENSURE ENLIVE / ENSURE PLUS) LIQD Take 237 mLs by mouth 2 (two) times daily between meals. 237 mL 12   furosemide (LASIX) 20 MG tablet Take 20 mg by mouth daily as needed.     gabapentin (NEURONTIN) 400 MG capsule Take 400 mg by mouth 3 (three) times daily.  12   lisinopril (ZESTRIL) 10 MG tablet Take 10 mg by mouth daily.     Potassium Chloride ER 20 MEQ TBCR Take by mouth.     senna-docusate (SENOKOT-S) 8.6-50  MG tablet Take 1 tablet by mouth at bedtime as needed for mild constipation. 30 tablet 0   No current facility-administered medications for this visit.     ROS:  See HPI  Physical Exam:  Vitals:   09/27/20 0950  BP: 99/72  Pulse: 95  Resp: 20  Temp: 98.2 F (36.8 C)  TempSrc: Temporal  SpO2: 96%  Height: 5\' 3"  (1.6 m)    Incision:  left groin incision is healed, no drainage on compression, no redness or swelling. No sutures present Extremities:  2+ femoral pulse, 1+ PT pulse in left leg. Left foot warm and well perfused. Multiple excoriations down leg. Do not appear infected Neuro: alert and oriented Abdomen:  soft, non tender, non distended  Assessment/Plan:  This is a 75 y.o. female who is s/p s/p Repair of left femoral false aneurysm with interposition of 6 mm PTFE from old left limb of aortofemoral graft to profundus femoris bypass.  Excision of nonviable skin left groin. This was performed on 08/14/20 by Dr. Donnetta Hutching Dr. Carlis Abbott. She is not having any rest pain, claudication or tissue loss. Left groin is well healed - Continue Aspirin and statin -She will follow up in 6  months with ABI   Karoline Caldwell, PA-C [facility] [phone]  On call MD:  Carlis Abbott

## 2020-09-30 ENCOUNTER — Other Ambulatory Visit: Payer: Self-pay

## 2020-09-30 DIAGNOSIS — I779 Disorder of arteries and arterioles, unspecified: Secondary | ICD-10-CM

## 2020-10-03 DIAGNOSIS — I10 Essential (primary) hypertension: Secondary | ICD-10-CM | POA: Diagnosis not present

## 2020-10-03 DIAGNOSIS — B359 Dermatophytosis, unspecified: Secondary | ICD-10-CM | POA: Diagnosis not present

## 2020-10-03 DIAGNOSIS — E46 Unspecified protein-calorie malnutrition: Secondary | ICD-10-CM | POA: Diagnosis not present

## 2020-10-03 DIAGNOSIS — J449 Chronic obstructive pulmonary disease, unspecified: Secondary | ICD-10-CM | POA: Diagnosis not present

## 2020-10-03 DIAGNOSIS — E871 Hypo-osmolality and hyponatremia: Secondary | ICD-10-CM | POA: Diagnosis not present

## 2020-10-03 DIAGNOSIS — I739 Peripheral vascular disease, unspecified: Secondary | ICD-10-CM | POA: Diagnosis not present

## 2020-10-03 DIAGNOSIS — Z89611 Acquired absence of right leg above knee: Secondary | ICD-10-CM | POA: Diagnosis not present

## 2020-10-03 DIAGNOSIS — E7800 Pure hypercholesterolemia, unspecified: Secondary | ICD-10-CM | POA: Diagnosis not present

## 2020-10-03 DIAGNOSIS — C140 Malignant neoplasm of pharynx, unspecified: Secondary | ICD-10-CM | POA: Diagnosis not present

## 2020-10-18 DIAGNOSIS — I739 Peripheral vascular disease, unspecified: Secondary | ICD-10-CM | POA: Diagnosis not present

## 2020-10-18 DIAGNOSIS — E871 Hypo-osmolality and hyponatremia: Secondary | ICD-10-CM | POA: Diagnosis not present

## 2020-10-18 DIAGNOSIS — B359 Dermatophytosis, unspecified: Secondary | ICD-10-CM | POA: Diagnosis not present

## 2020-10-18 DIAGNOSIS — M79605 Pain in left leg: Secondary | ICD-10-CM | POA: Diagnosis not present

## 2020-10-18 DIAGNOSIS — I10 Essential (primary) hypertension: Secondary | ICD-10-CM | POA: Diagnosis not present

## 2020-10-18 DIAGNOSIS — M159 Polyosteoarthritis, unspecified: Secondary | ICD-10-CM | POA: Diagnosis not present

## 2020-10-18 DIAGNOSIS — C140 Malignant neoplasm of pharynx, unspecified: Secondary | ICD-10-CM | POA: Diagnosis not present

## 2020-10-18 DIAGNOSIS — E7800 Pure hypercholesterolemia, unspecified: Secondary | ICD-10-CM | POA: Diagnosis not present

## 2020-10-18 DIAGNOSIS — M25562 Pain in left knee: Secondary | ICD-10-CM | POA: Diagnosis not present

## 2020-10-18 DIAGNOSIS — J449 Chronic obstructive pulmonary disease, unspecified: Secondary | ICD-10-CM | POA: Diagnosis not present

## 2020-10-18 DIAGNOSIS — Z5321 Procedure and treatment not carried out due to patient leaving prior to being seen by health care provider: Secondary | ICD-10-CM | POA: Diagnosis not present

## 2020-10-18 DIAGNOSIS — Z89611 Acquired absence of right leg above knee: Secondary | ICD-10-CM | POA: Diagnosis not present

## 2020-10-19 DIAGNOSIS — J9601 Acute respiratory failure with hypoxia: Secondary | ICD-10-CM | POA: Diagnosis not present

## 2020-10-19 DIAGNOSIS — R578 Other shock: Secondary | ICD-10-CM | POA: Diagnosis not present

## 2020-10-20 ENCOUNTER — Encounter (HOSPITAL_COMMUNITY): Payer: Self-pay

## 2020-10-20 ENCOUNTER — Emergency Department (HOSPITAL_COMMUNITY): Payer: Medicare PPO | Admitting: Certified Registered Nurse Anesthetist

## 2020-10-20 ENCOUNTER — Inpatient Hospital Stay (HOSPITAL_COMMUNITY): Payer: Medicare PPO

## 2020-10-20 ENCOUNTER — Other Ambulatory Visit: Payer: Self-pay

## 2020-10-20 ENCOUNTER — Inpatient Hospital Stay (HOSPITAL_COMMUNITY)
Admission: EM | Admit: 2020-10-20 | Discharge: 2020-11-14 | DRG: 239 | Disposition: E | Payer: Medicare PPO | Source: Other Acute Inpatient Hospital | Attending: Vascular Surgery | Admitting: Vascular Surgery

## 2020-10-20 ENCOUNTER — Encounter (HOSPITAL_COMMUNITY): Admission: EM | Disposition: E | Payer: Self-pay | Source: Other Acute Inpatient Hospital | Attending: Vascular Surgery

## 2020-10-20 DIAGNOSIS — E872 Acidosis: Secondary | ICD-10-CM | POA: Diagnosis present

## 2020-10-20 DIAGNOSIS — I252 Old myocardial infarction: Secondary | ICD-10-CM | POA: Diagnosis not present

## 2020-10-20 DIAGNOSIS — Z66 Do not resuscitate: Secondary | ICD-10-CM | POA: Diagnosis not present

## 2020-10-20 DIAGNOSIS — N179 Acute kidney failure, unspecified: Secondary | ICD-10-CM | POA: Diagnosis not present

## 2020-10-20 DIAGNOSIS — Z89611 Acquired absence of right leg above knee: Secondary | ICD-10-CM

## 2020-10-20 DIAGNOSIS — R64 Cachexia: Secondary | ICD-10-CM | POA: Diagnosis present

## 2020-10-20 DIAGNOSIS — F17210 Nicotine dependence, cigarettes, uncomplicated: Secondary | ICD-10-CM | POA: Diagnosis present

## 2020-10-20 DIAGNOSIS — J9621 Acute and chronic respiratory failure with hypoxia: Secondary | ICD-10-CM | POA: Diagnosis not present

## 2020-10-20 DIAGNOSIS — E162 Hypoglycemia, unspecified: Secondary | ICD-10-CM | POA: Diagnosis not present

## 2020-10-20 DIAGNOSIS — R739 Hyperglycemia, unspecified: Secondary | ICD-10-CM | POA: Diagnosis present

## 2020-10-20 DIAGNOSIS — E7800 Pure hypercholesterolemia, unspecified: Secondary | ICD-10-CM | POA: Diagnosis not present

## 2020-10-20 DIAGNOSIS — Z7189 Other specified counseling: Secondary | ICD-10-CM | POA: Diagnosis not present

## 2020-10-20 DIAGNOSIS — F419 Anxiety disorder, unspecified: Secondary | ICD-10-CM | POA: Diagnosis present

## 2020-10-20 DIAGNOSIS — J9690 Respiratory failure, unspecified, unspecified whether with hypoxia or hypercapnia: Secondary | ICD-10-CM

## 2020-10-20 DIAGNOSIS — I5020 Unspecified systolic (congestive) heart failure: Secondary | ICD-10-CM | POA: Diagnosis present

## 2020-10-20 DIAGNOSIS — J80 Acute respiratory distress syndrome: Secondary | ICD-10-CM

## 2020-10-20 DIAGNOSIS — I517 Cardiomegaly: Secondary | ICD-10-CM | POA: Diagnosis not present

## 2020-10-20 DIAGNOSIS — Z9889 Other specified postprocedural states: Secondary | ICD-10-CM

## 2020-10-20 DIAGNOSIS — L97129 Non-pressure chronic ulcer of left thigh with unspecified severity: Secondary | ICD-10-CM | POA: Diagnosis not present

## 2020-10-20 DIAGNOSIS — J1282 Pneumonia due to coronavirus disease 2019: Secondary | ICD-10-CM | POA: Diagnosis not present

## 2020-10-20 DIAGNOSIS — Z85818 Personal history of malignant neoplasm of other sites of lip, oral cavity, and pharynx: Secondary | ICD-10-CM

## 2020-10-20 DIAGNOSIS — T82868A Thrombosis of vascular prosthetic devices, implants and grafts, initial encounter: Secondary | ICD-10-CM | POA: Diagnosis present

## 2020-10-20 DIAGNOSIS — I70622 Atherosclerosis of nonbiological bypass graft(s) of the extremities with rest pain, left leg: Secondary | ICD-10-CM | POA: Diagnosis present

## 2020-10-20 DIAGNOSIS — Y832 Surgical operation with anastomosis, bypass or graft as the cause of abnormal reaction of the patient, or of later complication, without mention of misadventure at the time of the procedure: Secondary | ICD-10-CM | POA: Diagnosis present

## 2020-10-20 DIAGNOSIS — D638 Anemia in other chronic diseases classified elsewhere: Secondary | ICD-10-CM | POA: Diagnosis present

## 2020-10-20 DIAGNOSIS — I7090 Unspecified atherosclerosis: Secondary | ICD-10-CM

## 2020-10-20 DIAGNOSIS — Z801 Family history of malignant neoplasm of trachea, bronchus and lung: Secondary | ICD-10-CM

## 2020-10-20 DIAGNOSIS — I998 Other disorder of circulatory system: Secondary | ICD-10-CM | POA: Diagnosis not present

## 2020-10-20 DIAGNOSIS — L89150 Pressure ulcer of sacral region, unstageable: Secondary | ICD-10-CM | POA: Diagnosis not present

## 2020-10-20 DIAGNOSIS — N261 Atrophy of kidney (terminal): Secondary | ICD-10-CM | POA: Diagnosis not present

## 2020-10-20 DIAGNOSIS — Z681 Body mass index (BMI) 19 or less, adult: Secondary | ICD-10-CM

## 2020-10-20 DIAGNOSIS — Z515 Encounter for palliative care: Secondary | ICD-10-CM | POA: Diagnosis not present

## 2020-10-20 DIAGNOSIS — M79604 Pain in right leg: Secondary | ICD-10-CM | POA: Diagnosis not present

## 2020-10-20 DIAGNOSIS — E43 Unspecified severe protein-calorie malnutrition: Secondary | ICD-10-CM | POA: Diagnosis not present

## 2020-10-20 DIAGNOSIS — I214 Non-ST elevation (NSTEMI) myocardial infarction: Secondary | ICD-10-CM | POA: Diagnosis not present

## 2020-10-20 DIAGNOSIS — Z930 Tracheostomy status: Secondary | ICD-10-CM

## 2020-10-20 DIAGNOSIS — R7401 Elevation of levels of liver transaminase levels: Secondary | ICD-10-CM | POA: Diagnosis present

## 2020-10-20 DIAGNOSIS — I70229 Atherosclerosis of native arteries of extremities with rest pain, unspecified extremity: Secondary | ICD-10-CM | POA: Diagnosis not present

## 2020-10-20 DIAGNOSIS — R54 Age-related physical debility: Secondary | ICD-10-CM | POA: Diagnosis present

## 2020-10-20 DIAGNOSIS — J90 Pleural effusion, not elsewhere classified: Secondary | ICD-10-CM | POA: Diagnosis not present

## 2020-10-20 DIAGNOSIS — M79605 Pain in left leg: Secondary | ICD-10-CM | POA: Diagnosis not present

## 2020-10-20 DIAGNOSIS — J811 Chronic pulmonary edema: Secondary | ICD-10-CM | POA: Diagnosis not present

## 2020-10-20 DIAGNOSIS — I70241 Atherosclerosis of native arteries of left leg with ulceration of thigh: Secondary | ICD-10-CM | POA: Diagnosis not present

## 2020-10-20 DIAGNOSIS — J9601 Acute respiratory failure with hypoxia: Secondary | ICD-10-CM

## 2020-10-20 DIAGNOSIS — D62 Acute posthemorrhagic anemia: Secondary | ICD-10-CM | POA: Diagnosis not present

## 2020-10-20 DIAGNOSIS — R579 Shock, unspecified: Secondary | ICD-10-CM | POA: Diagnosis not present

## 2020-10-20 DIAGNOSIS — I081 Rheumatic disorders of both mitral and tricuspid valves: Secondary | ICD-10-CM | POA: Diagnosis present

## 2020-10-20 DIAGNOSIS — U071 COVID-19: Secondary | ICD-10-CM | POA: Diagnosis present

## 2020-10-20 DIAGNOSIS — G8929 Other chronic pain: Secondary | ICD-10-CM | POA: Diagnosis present

## 2020-10-20 DIAGNOSIS — R578 Other shock: Secondary | ICD-10-CM | POA: Diagnosis not present

## 2020-10-20 DIAGNOSIS — E876 Hypokalemia: Secondary | ICD-10-CM | POA: Diagnosis not present

## 2020-10-20 DIAGNOSIS — I700 Atherosclerosis of aorta: Secondary | ICD-10-CM | POA: Diagnosis not present

## 2020-10-20 DIAGNOSIS — Z9289 Personal history of other medical treatment: Secondary | ICD-10-CM

## 2020-10-20 DIAGNOSIS — R000 Tachycardia, unspecified: Secondary | ICD-10-CM | POA: Diagnosis not present

## 2020-10-20 HISTORY — PX: AMPUTATION: SHX166

## 2020-10-20 HISTORY — PX: AXILLARY-FEMORAL BYPASS GRAFT: SHX894

## 2020-10-20 LAB — POCT I-STAT 7, (LYTES, BLD GAS, ICA,H+H)
Acid-base deficit: 9 mmol/L — ABNORMAL HIGH (ref 0.0–2.0)
Acid-base deficit: 13 mmol/L — ABNORMAL HIGH (ref 0.0–2.0)
Acid-base deficit: 12 mmol/L — ABNORMAL HIGH (ref 0.0–2.0)
Acid-base deficit: 10 mmol/L — ABNORMAL HIGH (ref 0.0–2.0)
Bicarbonate: 18.6 mmol/L — ABNORMAL LOW (ref 20.0–28.0)
Bicarbonate: 15.8 mmol/L — ABNORMAL LOW (ref 20.0–28.0)
Bicarbonate: 17.6 mmol/L — ABNORMAL LOW (ref 20.0–28.0)
Bicarbonate: 17.6 mmol/L — ABNORMAL LOW (ref 20.0–28.0)
Calcium, Ion: 1.31 mmol/L (ref 1.15–1.40)
Calcium, Ion: 1.54 mmol/L (ref 1.15–1.40)
Calcium, Ion: 1.3 mmol/L (ref 1.15–1.40)
Calcium, Ion: 1.21 mmol/L (ref 1.15–1.40)
HCT: 27 % — ABNORMAL LOW (ref 36.0–46.0)
HCT: 32 % — ABNORMAL LOW (ref 36.0–46.0)
HCT: 36 % (ref 36.0–46.0)
HCT: 30 % — ABNORMAL LOW (ref 36.0–46.0)
Hemoglobin: 9.2 g/dL — ABNORMAL LOW (ref 12.0–15.0)
Hemoglobin: 10.9 g/dL — ABNORMAL LOW (ref 12.0–15.0)
Hemoglobin: 12.2 g/dL (ref 12.0–15.0)
Hemoglobin: 10.2 g/dL — ABNORMAL LOW (ref 12.0–15.0)
O2 Saturation: 98 %
O2 Saturation: 91 %
O2 Saturation: 85 %
O2 Saturation: 99 %
Patient temperature: 35.1
Patient temperature: 36.3
Patient temperature: 98.6
Patient temperature: 36.6
Potassium: 4.1 mmol/L (ref 3.5–5.1)
Potassium: 5 mmol/L (ref 3.5–5.1)
Potassium: 4.6 mmol/L (ref 3.5–5.1)
Potassium: 4.1 mmol/L (ref 3.5–5.1)
Sodium: 135 mmol/L (ref 135–145)
Sodium: 135 mmol/L (ref 135–145)
Sodium: 134 mmol/L — ABNORMAL LOW (ref 135–145)
Sodium: 137 mmol/L (ref 135–145)
TCO2: 20 mmol/L — ABNORMAL LOW (ref 22–32)
TCO2: 17 mmol/L — ABNORMAL LOW (ref 22–32)
TCO2: 19 mmol/L — ABNORMAL LOW (ref 22–32)
TCO2: 19 mmol/L — ABNORMAL LOW (ref 22–32)
pCO2 arterial: 42.7 mmHg (ref 32.0–48.0)
pCO2 arterial: 46.4 mmHg (ref 32.0–48.0)
pCO2 arterial: 54.6 mmHg — ABNORMAL HIGH (ref 32.0–48.0)
pCO2 arterial: 41.9 mmHg (ref 32.0–48.0)
pH, Arterial: 7.237 — ABNORMAL LOW (ref 7.350–7.450)
pH, Arterial: 7.134 — CL (ref 7.350–7.450)
pH, Arterial: 7.116 — CL (ref 7.350–7.450)
pH, Arterial: 7.229 — ABNORMAL LOW (ref 7.350–7.450)
pO2, Arterial: 109 mmHg — ABNORMAL HIGH (ref 83.0–108.0)
pO2, Arterial: 78 mmHg — ABNORMAL LOW (ref 83.0–108.0)
pO2, Arterial: 68 mmHg — ABNORMAL LOW (ref 83.0–108.0)
pO2, Arterial: 166 mmHg — ABNORMAL HIGH (ref 83.0–108.0)

## 2020-10-20 LAB — CBC WITH DIFFERENTIAL/PLATELET
Abs Immature Granulocytes: 0.32 10*3/uL — ABNORMAL HIGH (ref 0.00–0.07)
Basophils Absolute: 0.1 10*3/uL (ref 0.0–0.1)
Basophils Relative: 0 %
Eosinophils Absolute: 0 10*3/uL (ref 0.0–0.5)
Eosinophils Relative: 0 %
HCT: 38.2 % (ref 36.0–46.0)
Hemoglobin: 12 g/dL (ref 12.0–15.0)
Immature Granulocytes: 2 %
Lymphocytes Relative: 10 %
Lymphs Abs: 1.8 10*3/uL (ref 0.7–4.0)
MCH: 29.4 pg (ref 26.0–34.0)
MCHC: 31.4 g/dL (ref 30.0–36.0)
MCV: 93.6 fL (ref 80.0–100.0)
Monocytes Absolute: 0.8 10*3/uL (ref 0.1–1.0)
Monocytes Relative: 5 %
Neutro Abs: 14.9 10*3/uL — ABNORMAL HIGH (ref 1.7–7.7)
Neutrophils Relative %: 83 %
Platelets: 320 10*3/uL (ref 150–400)
RBC: 4.08 MIL/uL (ref 3.87–5.11)
RDW: 14.3 % (ref 11.5–15.5)
WBC: 18 10*3/uL — ABNORMAL HIGH (ref 4.0–10.5)
nRBC: 0 % (ref 0.0–0.2)

## 2020-10-20 LAB — POCT ACTIVATED CLOTTING TIME
Activated Clotting Time: 237 seconds
Activated Clotting Time: 260 seconds

## 2020-10-20 LAB — COMPREHENSIVE METABOLIC PANEL
ALT: 22 U/L (ref 0–44)
AST: 76 U/L — ABNORMAL HIGH (ref 15–41)
Albumin: 2.3 g/dL — ABNORMAL LOW (ref 3.5–5.0)
Alkaline Phosphatase: 71 U/L (ref 38–126)
Anion gap: 10 (ref 5–15)
BUN: 8 mg/dL (ref 8–23)
CO2: 18 mmol/L — ABNORMAL LOW (ref 22–32)
Calcium: 8.6 mg/dL — ABNORMAL LOW (ref 8.9–10.3)
Chloride: 106 mmol/L (ref 98–111)
Creatinine, Ser: 0.65 mg/dL (ref 0.44–1.00)
GFR, Estimated: >60 mL/min (ref >60)
Glucose, Bld: 318 mg/dL — ABNORMAL HIGH (ref 70–99)
Potassium: 5.1 mmol/L (ref 3.5–5.1)
Sodium: 134 mmol/L — ABNORMAL LOW (ref 135–145)
Total Bilirubin: 1.1 mg/dL (ref 0.3–1.2)
Total Protein: 4.4 g/dL — ABNORMAL LOW (ref 6.5–8.1)

## 2020-10-20 LAB — LACTIC ACID, PLASMA
Lactic Acid, Venous: 5 mmol/L (ref 0.5–1.9)
Lactic Acid, Venous: 4.4 mmol/L (ref 0.5–1.9)

## 2020-10-20 LAB — POCT I-STAT, CHEM 8
BUN: 6 mg/dL — ABNORMAL LOW (ref 8–23)
Calcium, Ion: 1.3 mmol/L (ref 1.15–1.40)
Chloride: 105 mmol/L (ref 98–111)
Creatinine, Ser: 0.3 mg/dL — ABNORMAL LOW (ref 0.44–1.00)
Glucose, Bld: 124 mg/dL — ABNORMAL HIGH (ref 70–99)
HCT: 26 % — ABNORMAL LOW (ref 36.0–46.0)
Hemoglobin: 8.8 g/dL — ABNORMAL LOW (ref 12.0–15.0)
Potassium: 4.2 mmol/L (ref 3.5–5.1)
Sodium: 135 mmol/L (ref 135–145)
TCO2: 21 mmol/L — ABNORMAL LOW (ref 22–32)

## 2020-10-20 LAB — SURGICAL PCR SCREEN
MRSA, PCR: NEGATIVE
Staphylococcus aureus: NEGATIVE

## 2020-10-20 LAB — MAGNESIUM: Magnesium: 1.6 mg/dL — ABNORMAL LOW (ref 1.7–2.4)

## 2020-10-20 LAB — APTT: aPTT: 36 seconds (ref 24–36)

## 2020-10-20 LAB — PREPARE RBC (CROSSMATCH)

## 2020-10-20 LAB — PROTIME-INR
INR: 1.4 — ABNORMAL HIGH (ref 0.8–1.2)
Prothrombin Time: 16.8 seconds — ABNORMAL HIGH (ref 11.4–15.2)

## 2020-10-20 LAB — PHOSPHORUS: Phosphorus: 6.8 mg/dL — ABNORMAL HIGH (ref 2.5–4.6)

## 2020-10-20 SURGERY — CREATION, BYPASS, ARTERIAL, AXILLARY TO BILATERAL FEMORAL, USING GRAFT
Anesthesia: General | Site: Knee | Laterality: Left

## 2020-10-20 MED ORDER — PROPOFOL 10 MG/ML IV BOLUS
INTRAVENOUS | Status: AC
  Filled 2020-10-20: qty 20

## 2020-10-20 MED ORDER — PHENYLEPHRINE HCL-NACL 20-0.9 MG/250ML-% IV SOLN: 0.0000 ug/min | INTRAVENOUS | Status: DC

## 2020-10-20 MED ORDER — HYDRALAZINE HCL 20 MG/ML IJ SOLN: 5.0000 mg | INTRAMUSCULAR | Status: DC | PRN

## 2020-10-20 MED ORDER — DEXMEDETOMIDINE HCL IN NACL 400 MCG/100ML IV SOLN
0.0000 ug/kg/h | INTRAVENOUS | Status: DC
  Administered 2020-10-20: 0.5 ug/kg/h via INTRAVENOUS
  Administered 2020-10-21: 0.6 ug/kg/h via INTRAVENOUS
  Administered 2020-10-22 – 2020-10-23 (×4): 1.2 ug/kg/h via INTRAVENOUS
  Filled 2020-10-20 (×7): qty 100

## 2020-10-20 MED ORDER — OXYCODONE-ACETAMINOPHEN 5-325 MG PO TABS
1.0000 | ORAL_TABLET | ORAL | Status: DC | PRN
  Administered 2020-10-21: 2 via ORAL
  Filled 2020-10-20: qty 2

## 2020-10-20 MED ORDER — LACTATED RINGERS IV BOLUS: 1000.0000 mL | Freq: Once | INTRAVENOUS | Status: DC

## 2020-10-20 MED ORDER — 0.9 % SODIUM CHLORIDE (POUR BTL) OPTIME
TOPICAL | Status: DC | PRN
  Administered 2020-10-20: 2000 mL

## 2020-10-20 MED ORDER — PROPOFOL 500 MG/50ML IV EMUL
INTRAVENOUS | Status: DC | PRN
  Administered 2020-10-20: 40 mg via INTRAVENOUS

## 2020-10-20 MED ORDER — MIDAZOLAM HCL 2 MG/2ML IJ SOLN
1.0000 mg | INTRAMUSCULAR | Status: DC | PRN
  Administered 2020-10-23 (×2): 1 mg via INTRAVENOUS

## 2020-10-20 MED ORDER — ENSURE ENLIVE PO LIQD: 237.0000 mL | Freq: Two times a day (BID) | ORAL | Status: DC

## 2020-10-20 MED ORDER — GUAIFENESIN-DM 100-10 MG/5ML PO SYRP: 15.0000 mL | ORAL_SOLUTION | ORAL | Status: DC | PRN

## 2020-10-20 MED ORDER — ONDANSETRON HCL 4 MG/2ML IJ SOLN
INTRAMUSCULAR | Status: AC
  Filled 2020-10-20: qty 4

## 2020-10-20 MED ORDER — PANTOPRAZOLE SODIUM 40 MG IV SOLR
40.0000 mg | Freq: Every day | INTRAVENOUS | Status: DC
  Administered 2020-10-20 – 2020-10-22 (×3): 40 mg via INTRAVENOUS
  Filled 2020-10-20 (×3): qty 40

## 2020-10-20 MED ORDER — POTASSIUM CHLORIDE CRYS ER 20 MEQ PO TBCR: 20.0000 meq | EXTENDED_RELEASE_TABLET | Freq: Every day | ORAL | Status: DC | PRN

## 2020-10-20 MED ORDER — HEPARIN 6000 UNIT IRRIGATION SOLUTION
Status: DC | PRN
  Administered 2020-10-20: 1

## 2020-10-20 MED ORDER — ACETAMINOPHEN 325 MG RE SUPP
325.0000 mg | RECTAL | Status: DC | PRN
Start: 2020-10-20 — End: 2020-10-23
  Administered 2020-10-23: 650 mg via RECTAL
  Filled 2020-10-20: qty 2

## 2020-10-20 MED ORDER — VASOPRESSIN 20 UNIT/ML IV SOLN
INTRAVENOUS | Status: AC
  Filled 2020-10-20: qty 1

## 2020-10-20 MED ORDER — ALUM & MAG HYDROXIDE-SIMETH 200-200-20 MG/5ML PO SUSP: 15.0000 mL | ORAL | Status: DC | PRN

## 2020-10-20 MED ORDER — FENTANYL CITRATE (PF) 100 MCG/2ML IJ SOLN
25.0000 ug | Freq: Once | INTRAMUSCULAR | Status: AC
  Administered 2020-10-20: 25 ug via INTRAVENOUS
  Filled 2020-10-20: qty 2

## 2020-10-20 MED ORDER — SUCCINYLCHOLINE CHLORIDE 200 MG/10ML IV SOSY
PREFILLED_SYRINGE | INTRAVENOUS | Status: AC
  Filled 2020-10-20: qty 10

## 2020-10-20 MED ORDER — SODIUM CHLORIDE 0.9 % IV SOLN: 500.0000 mL | Freq: Once | INTRAVENOUS | Status: DC | PRN

## 2020-10-20 MED ORDER — SODIUM BICARBONATE 8.4 % IV SOLN
INTRAVENOUS | Status: DC | PRN
  Administered 2020-10-20: 50 meq via INTRAVENOUS

## 2020-10-20 MED ORDER — SODIUM BICARBONATE 8.4 % IV SOLN
INTRAVENOUS | Status: AC
  Filled 2020-10-20: qty 50

## 2020-10-20 MED ORDER — SODIUM CHLORIDE 0.9 % IV SOLN: INTRAVENOUS | Status: AC

## 2020-10-20 MED ORDER — MORPHINE SULFATE (PF) 2 MG/ML IV SOLN
2.0000 mg | INTRAVENOUS | Status: DC | PRN
  Administered 2020-10-23: 2 mg via INTRAVENOUS
  Filled 2020-10-20: qty 1

## 2020-10-20 MED ORDER — LABETALOL HCL 5 MG/ML IV SOLN
10.0000 mg | INTRAVENOUS | Status: DC | PRN
Start: 2020-10-20 — End: 2020-10-23

## 2020-10-20 MED ORDER — DEXAMETHASONE SODIUM PHOSPHATE 10 MG/ML IJ SOLN
INTRAMUSCULAR | Status: DC | PRN
  Administered 2020-10-20: 4 mg via INTRAVENOUS

## 2020-10-20 MED ORDER — CALCIUM CHLORIDE 10 % IV SOLN
INTRAVENOUS | Status: DC | PRN
  Administered 2020-10-20 (×4): 100 mg via INTRAVENOUS
  Administered 2020-10-20 (×2): 200 mg via INTRAVENOUS
  Administered 2020-10-20 (×2): 100 mg via INTRAVENOUS

## 2020-10-20 MED ORDER — NOREPINEPHRINE 4 MG/250ML-% IV SOLN
5.0000 ug/min | INTRAVENOUS | Status: DC
  Administered 2020-10-20: 6 ug/min via INTRAVENOUS
  Administered 2020-10-21: 4 ug/min via INTRAVENOUS
  Administered 2020-10-21 – 2020-10-22 (×2): 5 ug/min via INTRAVENOUS
  Administered 2020-10-23: 8 ug/min via INTRAVENOUS
  Filled 2020-10-20 (×5): qty 250

## 2020-10-20 MED ORDER — ACETAMINOPHEN 325 MG PO TABS: 325.0000 mg | ORAL_TABLET | ORAL | Status: DC | PRN

## 2020-10-20 MED ORDER — CEFAZOLIN SODIUM-DEXTROSE 2-4 GM/100ML-% IV SOLN
2.0000 g | Freq: Three times a day (TID) | INTRAVENOUS | Status: DC
  Administered 2020-10-20 – 2020-10-22 (×5): 2 g via INTRAVENOUS
  Filled 2020-10-20 (×7): qty 100

## 2020-10-20 MED ORDER — SODIUM CHLORIDE 0.9 % IV BOLUS
1000.0000 mL | Freq: Once | INTRAVENOUS | Status: AC
  Administered 2020-10-20: 1000 mL via INTRAVENOUS

## 2020-10-20 MED ORDER — PHENOL 1.4 % MT LIQD: 1.0000 | OROMUCOSAL | Status: DC | PRN

## 2020-10-20 MED ORDER — MIDAZOLAM HCL 2 MG/2ML IJ SOLN
1.0000 mg | INTRAMUSCULAR | Status: DC | PRN
  Filled 2020-10-20: qty 2

## 2020-10-20 MED ORDER — DEXAMETHASONE SODIUM PHOSPHATE 10 MG/ML IJ SOLN
INTRAMUSCULAR | Status: AC
  Filled 2020-10-20: qty 3

## 2020-10-20 MED ORDER — ONDANSETRON HCL 4 MG/2ML IJ SOLN: 4.0000 mg | Freq: Four times a day (QID) | INTRAMUSCULAR | Status: DC | PRN

## 2020-10-20 MED ORDER — DOCUSATE SODIUM 100 MG PO CAPS: 100.0000 mg | ORAL_CAPSULE | Freq: Two times a day (BID) | ORAL | Status: DC | PRN

## 2020-10-20 MED ORDER — ONDANSETRON HCL 4 MG/2ML IJ SOLN
INTRAMUSCULAR | Status: DC | PRN
  Administered 2020-10-20: 4 mg via INTRAVENOUS

## 2020-10-20 MED ORDER — DOCUSATE SODIUM 100 MG PO CAPS: 100.0000 mg | ORAL_CAPSULE | Freq: Every day | ORAL | Status: DC

## 2020-10-20 MED ORDER — HEPARIN SODIUM (PORCINE) 1000 UNIT/ML IJ SOLN
INTRAMUSCULAR | Status: DC | PRN
  Administered 2020-10-20: 5000 [IU] via INTRAVENOUS
  Administered 2020-10-20: 2000 [IU] via INTRAVENOUS

## 2020-10-20 MED ORDER — PANTOPRAZOLE SODIUM 40 MG PO TBEC: 40.0000 mg | DELAYED_RELEASE_TABLET | Freq: Every day | ORAL | Status: DC

## 2020-10-20 MED ORDER — FENTANYL CITRATE (PF) 250 MCG/5ML IJ SOLN
INTRAMUSCULAR | Status: AC
  Filled 2020-10-20: qty 5

## 2020-10-20 MED ORDER — SODIUM CHLORIDE 0.9 % IV SOLN
250.0000 mL | INTRAVENOUS | Status: DC
  Administered 2020-10-21: 250 mL via INTRAVENOUS

## 2020-10-20 MED ORDER — METOPROLOL TARTRATE 5 MG/5ML IV SOLN: 2.0000 mg | INTRAVENOUS | Status: DC | PRN

## 2020-10-20 MED ORDER — EPHEDRINE SULFATE-NACL 50-0.9 MG/10ML-% IV SOSY
PREFILLED_SYRINGE | INTRAVENOUS | Status: DC | PRN
  Administered 2020-10-20: 5 mg via INTRAVENOUS

## 2020-10-20 MED ORDER — LIDOCAINE 2% (20 MG/ML) 5 ML SYRINGE
INTRAMUSCULAR | Status: AC
  Filled 2020-10-20: qty 5

## 2020-10-20 MED ORDER — POLYETHYLENE GLYCOL 3350 17 G PO PACK: 17.0000 g | PACK | Freq: Every day | ORAL | Status: DC

## 2020-10-20 MED ORDER — CEFAZOLIN SODIUM-DEXTROSE 1-4 GM/50ML-% IV SOLN
INTRAVENOUS | Status: DC | PRN
  Administered 2020-10-20: 1 g via INTRAVENOUS

## 2020-10-20 MED ORDER — NOREPINEPHRINE 4 MG/250ML-% IV SOLN
INTRAVENOUS | Status: DC | PRN
  Administered 2020-10-20: 5 ug/min via INTRAVENOUS

## 2020-10-20 MED ORDER — ASPIRIN 81 MG PO CHEW
81.0000 mg | CHEWABLE_TABLET | Freq: Every day | ORAL | Status: DC
  Administered 2020-10-21: 81 mg
  Filled 2020-10-20: qty 1

## 2020-10-20 MED ORDER — ROCURONIUM BROMIDE 10 MG/ML (PF) SYRINGE
PREFILLED_SYRINGE | INTRAVENOUS | Status: AC
  Filled 2020-10-20: qty 10

## 2020-10-20 MED ORDER — VASOPRESSIN 20 UNIT/ML IV SOLN
INTRAVENOUS | Status: DC | PRN
  Administered 2020-10-20: 4 [IU] via INTRAVENOUS
  Administered 2020-10-20: 1 [IU] via INTRAVENOUS
  Administered 2020-10-20: 3 [IU] via INTRAVENOUS
  Administered 2020-10-20 (×2): 1 [IU] via INTRAVENOUS

## 2020-10-20 MED ORDER — LIDOCAINE 2% (20 MG/ML) 5 ML SYRINGE
INTRAMUSCULAR | Status: DC | PRN
  Administered 2020-10-20: 60 mg via INTRAVENOUS

## 2020-10-20 MED ORDER — ROCURONIUM BROMIDE 10 MG/ML (PF) SYRINGE
PREFILLED_SYRINGE | INTRAVENOUS | Status: DC | PRN
  Administered 2020-10-20: 60 mg via INTRAVENOUS

## 2020-10-20 MED ORDER — PROTAMINE SULFATE 10 MG/ML IV SOLN
INTRAVENOUS | Status: AC
  Filled 2020-10-20: qty 5

## 2020-10-20 MED ORDER — GABAPENTIN 300 MG PO CAPS
400.0000 mg | ORAL_CAPSULE | Freq: Three times a day (TID) | ORAL | Status: DC
  Administered 2020-10-21: 400 mg
  Filled 2020-10-20: qty 1

## 2020-10-20 MED ORDER — LACTATED RINGERS IV SOLN: INTRAVENOUS | Status: DC | PRN

## 2020-10-20 MED ORDER — FENTANYL 2500MCG IN NS 250ML (10MCG/ML) PREMIX INFUSION
25.0000 ug/h | INTRAVENOUS | Status: DC
  Administered 2020-10-20 – 2020-10-21 (×2): 25 ug/h via INTRAVENOUS
  Administered 2020-10-22: 125 ug/h via INTRAVENOUS
  Administered 2020-10-23: 200 ug/h via INTRAVENOUS
  Administered 2020-10-23 (×2): 400 ug/h via INTRAVENOUS
  Filled 2020-10-20 (×5): qty 250

## 2020-10-20 MED ORDER — MAGNESIUM SULFATE 2 GM/50ML IV SOLN
2.0000 g | Freq: Every day | INTRAVENOUS | Status: AC | PRN
Start: 2020-10-20 — End: 2020-10-21
  Administered 2020-10-20 (×2): 2 g via INTRAVENOUS
  Filled 2020-10-20: qty 50

## 2020-10-20 MED ORDER — HEPARIN 6000 UNIT IRRIGATION SOLUTION
Status: AC
  Filled 2020-10-20: qty 500

## 2020-10-20 MED ORDER — HEPARIN SODIUM (PORCINE) 5000 UNIT/ML IJ SOLN
5000.0000 [IU] | Freq: Three times a day (TID) | INTRAMUSCULAR | Status: DC
  Administered 2020-10-20 – 2020-10-23 (×8): 5000 [IU] via SUBCUTANEOUS
  Filled 2020-10-20 (×8): qty 1

## 2020-10-20 MED ORDER — ALBUMIN HUMAN 5 % IV SOLN: INTRAVENOUS | Status: DC | PRN

## 2020-10-20 MED ORDER — FENTANYL BOLUS VIA INFUSION
25.0000 ug | INTRAVENOUS | Status: DC | PRN
  Filled 2020-10-20: qty 100

## 2020-10-20 MED ORDER — FENTANYL CITRATE (PF) 250 MCG/5ML IJ SOLN
INTRAMUSCULAR | Status: DC | PRN
  Administered 2020-10-20: 25 ug via INTRAVENOUS

## 2020-10-20 MED ORDER — NOREPINEPHRINE BITARTRATE 1 MG/ML IV SOLN
INTRAVENOUS | Status: DC | PRN
  Administered 2020-10-20: 10 mL via INTRAVENOUS

## 2020-10-20 MED ORDER — PROTAMINE SULFATE 10 MG/ML IV SOLN
INTRAVENOUS | Status: DC | PRN
  Administered 2020-10-20: 50 mg via INTRAVENOUS

## 2020-10-20 MED ORDER — ATORVASTATIN CALCIUM 10 MG PO TABS: 10.0000 mg | ORAL_TABLET | Freq: Every day | ORAL | Status: DC

## 2020-10-20 MED ORDER — HEMOSTATIC AGENTS (NO CHARGE) OPTIME
TOPICAL | Status: DC | PRN
  Administered 2020-10-20: 1 via TOPICAL

## 2020-10-20 MED ORDER — PHENYLEPHRINE HCL-NACL 20-0.9 MG/250ML-% IV SOLN
INTRAVENOUS | Status: DC | PRN
Start: 2020-10-20 — End: 2020-10-20
  Administered 2020-10-20: 50 ug/min via INTRAVENOUS
  Administered 2020-10-20: 20 ug/min via INTRAVENOUS

## 2020-10-20 MED ORDER — HEPARIN (PORCINE) 25000 UT/250ML-% IV SOLN
500.0000 [IU]/h | INTRAVENOUS | Status: DC
  Administered 2020-10-20: 500 [IU]/h via INTRAVENOUS
  Filled 2020-10-20: qty 250

## 2020-10-20 MED ORDER — LACTATED RINGERS IV BOLUS: 500.0000 mL | Freq: Once | INTRAVENOUS | Status: DC

## 2020-10-20 MED ORDER — ASPIRIN EC 81 MG PO TBEC: 81.0000 mg | DELAYED_RELEASE_TABLET | Freq: Every day | ORAL | Status: DC

## 2020-10-20 MED ORDER — POLYETHYLENE GLYCOL 3350 17 G PO PACK
17.0000 g | PACK | Freq: Every day | ORAL | Status: DC | PRN
Start: 2020-10-20 — End: 2020-10-23

## 2020-10-20 SURGICAL SUPPLY — 90 items
BAG COUNTER SPONGE SURGICOUNT (BAG) ×3 IMPLANT
BANDAGE ESMARK 6X9 LF (GAUZE/BANDAGES/DRESSINGS) ×2 IMPLANT
BLADE SAGITTAL (BLADE)
BLADE SAGITTAL 25.0X1.19X90 (BLADE) IMPLANT
BLADE SAW GIGLI 510 (BLADE) ×3 IMPLANT
BLADE SAW RECIP 87.9 MT (BLADE) ×3 IMPLANT
BLADE SAW THK.89X75X18XSGTL (BLADE) IMPLANT
BLADE SURG 21 STRL SS (BLADE) ×3 IMPLANT
BNDG COHESIVE 6X5 TAN STRL LF (GAUZE/BANDAGES/DRESSINGS) ×3 IMPLANT
BNDG ELASTIC 4X5.8 VLCR STR LF (GAUZE/BANDAGES/DRESSINGS) ×3 IMPLANT
BNDG ELASTIC 6X5.8 VLCR STR LF (GAUZE/BANDAGES/DRESSINGS) ×3 IMPLANT
BNDG ESMARK 6X9 LF (GAUZE/BANDAGES/DRESSINGS) ×3
BNDG GAUZE ELAST 4 BULKY (GAUZE/BANDAGES/DRESSINGS) ×3 IMPLANT
CANISTER SUCT 3000ML PPV (MISCELLANEOUS) ×3 IMPLANT
CHLORAPREP W/TINT 26 (MISCELLANEOUS) ×3 IMPLANT
CLIP LIGATING EXTRA MED SLVR (CLIP) ×3 IMPLANT
CLIP LIGATING EXTRA SM BLUE (MISCELLANEOUS) ×3 IMPLANT
CLIP VESOCCLUDE MED 6/CT (CLIP) ×3 IMPLANT
CLIP VESOCCLUDE SM WIDE 24/CT (CLIP) ×3 IMPLANT
COVER SURGICAL LIGHT HANDLE (MISCELLANEOUS) ×3 IMPLANT
CUFF TOURN SGL QUICK 24 (TOURNIQUET CUFF)
CUFF TOURN SGL QUICK 34 (TOURNIQUET CUFF)
CUFF TRNQT CYL 24X4X16.5-23 (TOURNIQUET CUFF) IMPLANT
CUFF TRNQT CYL 34X4.125X (TOURNIQUET CUFF) IMPLANT
DERMABOND ADVANCED (GAUZE/BANDAGES/DRESSINGS) ×1
DERMABOND ADVANCED .7 DNX12 (GAUZE/BANDAGES/DRESSINGS) ×2 IMPLANT
DRAIN CHANNEL 19F RND (DRAIN) IMPLANT
DRAIN SNY 10X20 3/4 PERF (WOUND CARE) IMPLANT
DRAPE HALF SHEET 40X57 (DRAPES) ×3 IMPLANT
DRAPE INCISE IOBAN 66X45 STRL (DRAPES) ×3 IMPLANT
DRAPE ORTHO SPLIT 77X108 STRL (DRAPES) ×2
DRAPE SURG ORHT 6 SPLT 77X108 (DRAPES) ×4 IMPLANT
DRAPE X-RAY CASS 24X20 (DRAPES) IMPLANT
DRSG ADAPTIC 3X8 NADH LF (GAUZE/BANDAGES/DRESSINGS) ×3 IMPLANT
DRSG COVADERM 4X6 (GAUZE/BANDAGES/DRESSINGS) ×3 IMPLANT
ELECT CAUTERY BLADE 6.4 (BLADE) ×3 IMPLANT
ELECT REM PT RETURN 9FT ADLT (ELECTROSURGICAL) ×3
ELECTRODE REM PT RTRN 9FT ADLT (ELECTROSURGICAL) ×2 IMPLANT
EVACUATOR SILICONE 100CC (DRAIN) IMPLANT
GAUZE 4X4 16PLY ~~LOC~~+RFID DBL (SPONGE) IMPLANT
GAUZE SPONGE 4X4 12PLY STRL (GAUZE/BANDAGES/DRESSINGS) ×6 IMPLANT
GAUZE XEROFORM 5X9 LF (GAUZE/BANDAGES/DRESSINGS) ×3 IMPLANT
GLOVE SURG ENC MOIS LTX SZ7.5 (GLOVE) ×3 IMPLANT
GLOVE SURG POLYISO LF SZ8 (GLOVE) ×3 IMPLANT
GOWN STRL REUS W/ TWL LRG LVL3 (GOWN DISPOSABLE) ×4 IMPLANT
GOWN STRL REUS W/ TWL XL LVL3 (GOWN DISPOSABLE) ×2 IMPLANT
GOWN STRL REUS W/TWL LRG LVL3 (GOWN DISPOSABLE) ×2
GOWN STRL REUS W/TWL XL LVL3 (GOWN DISPOSABLE) ×1
GRAFT PROPATEN W/RING 6X80X60 (Vascular Products) ×3 IMPLANT
HEMOSTAT SNOW SURGICEL 2X4 (HEMOSTASIS) ×3 IMPLANT
INSERT FOGARTY SM (MISCELLANEOUS) IMPLANT
KIT BASIN OR (CUSTOM PROCEDURE TRAY) ×3 IMPLANT
KIT TURNOVER KIT B (KITS) ×3 IMPLANT
NS IRRIG 1000ML POUR BTL (IV SOLUTION) ×6 IMPLANT
PACK GENERAL/GYN (CUSTOM PROCEDURE TRAY) ×3 IMPLANT
PACK PERIPHERAL VASCULAR (CUSTOM PROCEDURE TRAY) ×3 IMPLANT
PAD ARMBOARD 7.5X6 YLW CONV (MISCELLANEOUS) ×6 IMPLANT
PENCIL SMOKE EVACUATOR (MISCELLANEOUS) ×3 IMPLANT
SET COLLECT BLD 21X3/4 12 (NEEDLE) IMPLANT
STAPLER SKIN 35 REG (STAPLE) ×3 IMPLANT
STAPLER VISISTAT 35W (STAPLE) ×3 IMPLANT
STOCKINETTE IMPERVIOUS LG (DRAPES) ×3 IMPLANT
STOPCOCK 4 WAY LG BORE MALE ST (IV SETS) IMPLANT
SUT ETHILON 2 0 PSLX (SUTURE) ×6 IMPLANT
SUT ETHILON 3 0 PS 1 (SUTURE) IMPLANT
SUT ETHILON 4 0 PS 2 18 (SUTURE) ×6 IMPLANT
SUT GORETEX 5 0 TT13 24 (SUTURE) IMPLANT
SUT GORETEX 6.0 TT9 (SUTURE) IMPLANT
SUT MNCRL AB 4-0 PS2 18 (SUTURE) ×6 IMPLANT
SUT PROLENE 5 0 C 1 36 (SUTURE) IMPLANT
SUT PROLENE 6 0 BV (SUTURE) ×9 IMPLANT
SUT PROLENE 6 0 CC (SUTURE) IMPLANT
SUT SILK 0 TIES 10X30 (SUTURE) ×3 IMPLANT
SUT SILK 2 0 (SUTURE) ×1
SUT SILK 2 0 SH (SUTURE) IMPLANT
SUT SILK 2 0 SH CR/8 (SUTURE) ×3 IMPLANT
SUT SILK 2-0 18XBRD TIE 12 (SUTURE) ×2 IMPLANT
SUT SILK 3 0 (SUTURE)
SUT SILK 3-0 18XBRD TIE 12 (SUTURE) IMPLANT
SUT VIC AB 2-0 CT1 18 (SUTURE) ×12 IMPLANT
SUT VIC AB 2-0 CT1 27 (SUTURE) ×2
SUT VIC AB 2-0 CT1 TAPERPNT 27 (SUTURE) ×4 IMPLANT
SUT VIC AB 3-0 SH 27 (SUTURE) ×2
SUT VIC AB 3-0 SH 27X BRD (SUTURE) ×4 IMPLANT
TAPE UMBILICAL COTTON 1/8X30 (MISCELLANEOUS) ×3 IMPLANT
TOWEL GREEN STERILE (TOWEL DISPOSABLE) ×6 IMPLANT
TRAY FOLEY MTR SLVR 16FR STAT (SET/KITS/TRAYS/PACK) ×3 IMPLANT
TUBING EXTENTION W/L.L. (IV SETS) IMPLANT
UNDERPAD 30X36 HEAVY ABSORB (UNDERPADS AND DIAPERS) ×3 IMPLANT
WATER STERILE IRR 1000ML POUR (IV SOLUTION) ×3 IMPLANT

## 2020-10-20 NOTE — Anesthesia Postprocedure Evaluation (Signed)
Anesthesia Post Note  Patient: ANIELA CANIGLIA  Procedure(s) Performed: BYPASS GRAFT Left AXILLA-Left FEMORAL. (Left: Groin) AMPUTATION ABOVE KNEE (Left: Knee)     Patient location during evaluation: SICU Anesthesia Type: General Level of consciousness: sedated Pain management: pain level controlled Vital Signs Assessment: post-procedure vital signs reviewed and stable Respiratory status: patient remains intubated per anesthesia plan and patient on ventilator - see flowsheet for VS Cardiovascular status: tachycardic and unstable Postop Assessment: no apparent nausea or vomiting Anesthetic complications: no   No notable events documented.  Last Vitals:  Vitals:   10/14/2020 1915 11/09/2020 1932  BP: (!) 147/88 (!) 149/90  Pulse:  (!) 144  Resp: (!) 21 12  Temp:    SpO2:      Last Pain:  Vitals:   11/01/2020 1345  TempSrc: Oral  PainSc:                  Lisia [place]bay,W. EDMOND

## 2020-10-20 NOTE — Anesthesia Preprocedure Evaluation (Addendum)
Anesthesia Evaluation  Patient identified by MRN, date of birth, ID band Patient awake    Reviewed: Allergy & Precautions, H&P , NPO status , Patient's Chart, lab work & pertinent test results  Airway Mallampati: Trach       Dental no notable dental hx. (+) Edentulous Upper, Edentulous Lower, Dental Advisory Given   Pulmonary Current Smoker,  Covid + H/o Laryngeal CA s/p laryngectomy   Pulmonary exam normal breath sounds clear to auscultation       Cardiovascular + Peripheral Vascular Disease  negative cardio ROS   Rhythm:Regular Rate:Normal     Neuro/Psych negative neurological ROS  negative psych ROS   GI/Hepatic negative GI ROS, Neg liver ROS,   Endo/Other  negative endocrine ROS  Renal/GU negative Renal ROS  negative genitourinary   Musculoskeletal   Abdominal   Peds  Hematology  (+) Blood dyscrasia, anemia ,   Anesthesia Other Findings   Reproductive/Obstetrics negative OB ROS                            Anesthesia Physical Anesthesia Plan  ASA: 3 and emergent  Anesthesia Plan: General   Post-op Pain Management:    Induction: Intravenous  PONV Risk Score and Plan: 3 and Ondansetron, Dexamethasone and Treatment may vary due to age or medical condition  Airway Management Planned: Oral ETT  Additional Equipment: Arterial line, CVP and Ultrasound Guidance Line Placement  Intra-op Plan:   Post-operative Plan: Extubation in OR and Possible Post-op intubation/ventilation  Informed Consent: I have reviewed the patients History and Physical, chart, labs and discussed the procedure including the risks, benefits and alternatives for the proposed anesthesia with the patient or authorized representative who has indicated his/her understanding and acceptance.     Dental advisory given  Plan Discussed with: CRNA  Anesthesia Plan Comments:         Anesthesia Quick  Evaluation

## 2020-10-20 NOTE — Consult Note (Signed)
NAME:  Heather Jimenez, MRN:  [id number], DOB:  Feb 07, 1946, LOS: 0 ADMISSION DATE:  11/11/2020, CONSULTATION DATE:  10/29/2020 REFERRING MD:  Jamelle Haring, MD  CHIEF COMPLAINT:  post-op hypotension   History of Present Illness:  Heather Jimenez is a 75 year old woman with throat cancer s/p tracheostomy, peripheral arterial disease, tobacco use who was transferred from [facility] for 3 weeks of left lower extremity pain. She has been admitted by Vascular Surgery for thrombosis of left aortofemoral bypass and left aortofemoral bypass limb to profunda femoris bypass causing non-salvageable left lower extremity. She is status post left axillary - profunda femoris bypass and left above the knee amputation.   PCCM has been consuted for post-operative ventilator management and for hypotension as she is being admitted to the ICU.  Pertinent  Medical History  Peripheral arterial disease Throat Cancer Chronic tracheostomy Tobacco Use  Significant Hospital Events: Including procedures, antibiotic start and stop dates in addition to other pertinent events   8/7 admitted to ICU. S/p left axillary - profunda femoris bypass and left above the knee amputation  Interim History / Subjective:   Patient is reported COVID-positive from [facility] per nursing.  Patient arrived to the to heart ICU after the OR.  She is currently on Levophed and has been weaned off Neo-Synephrine.  Patient is sedated and intubated through her tracheostomy stoma and is on mechanical ventilation.  Objective   Blood pressure 122/83, pulse (!) 111, temperature 98.6 F (37 C), temperature source Oral, resp. rate (!) 21, height 5\' 4"  (1.626 m), weight 34 kg, SpO2 95 %.        Intake/Output Summary (Last 24 hours) at 10/17/2020 1756 Last data filed at 10/25/2020 1726 Gross per 24 hour  Intake 1865 ml  Output 330 ml  Net 1535 ml   Filed Weights   11/04/2020 1145  Weight: 34 kg    Examination: General: Sedated, intubated,  chronically ill-appearing, grayish skin tone HENT: [place]/AT, pupils sluggish but reactive, moist mucous membranes, sclera anicteric Lungs: Clear to auscultation bilaterally.  No wheezing Cardiovascular: Tachycardic.  No murmurs rubs or gallops. Abdomen: Soft nontender nondistended positive bowel sounds Extremities: Bilateral amputation of lower extremities Neuro: Sedated GU: Foley in place  Resolved Hospital Problem list     Assessment & Plan:   Acute Hypoxemic Respiratory Failure Chronic tracheostomy stoma -Continue mechanical ventilation -PSV trial in the a.m. -VAP prevention protocol ordered - PAD - fentanyl, precedex gtt and PRN versed  Shock In postoperative setting -Awaiting CBC for possible acute blood loss anemia -Give 1 L NS bolus -Check random cortisol -Continue vasopressor support with Levophed. -Check echo  Peripheral Arterial Disease S/p left axillary - profunda femoris bypass and left above the knee amputation -Management per vascular surgery  Reported COVID-19 positive -Check PCR COVID test -We will determine if antiviral treatment and steroid therapy as needed based on test results.  Best Practice (right click and "Reselect all SmartList Selections" daily)   Diet/type: NPO DVT prophylaxis: prophylactic heparin  GI prophylaxis: PPI Lines: Central line Foley:  Yes, and it is still needed Code Status:  full code Last date of multidisciplinary goals of care discussion [will need ongoing discussions regarding her CODE STATUS in the future and goals of care given her chronic comorbidities]  Labs   CBC: No results for input(s): WBC, NEUTROABS, HGB, HCT, MCV, PLT in the last 168 hours.  Basic Metabolic Panel: No results for input(s): NA, K, CL, CO2, GLUCOSE, BUN, CREATININE, CALCIUM, MG,  PHOS in the last 168 hours. GFR: CrCl cannot be calculated (Patient's most recent lab result is older than the maximum 21 days allowed.). No results for input(s):  PROCALCITON, WBC, LATICACIDVEN in the last 168 hours.  Liver Function Tests: No results for input(s): AST, ALT, ALKPHOS, BILITOT, PROT, ALBUMIN in the last 168 hours. No results for input(s): LIPASE, AMYLASE in the last 168 hours. No results for input(s): AMMONIA in the last 168 hours.  ABG    Component Value Date/Time   PHART 7.333 (L) 08/14/2020 1844   PCO2ART 40.9 08/14/2020 1844   PO2ART 236 (H) 08/14/2020 1844   HCO3 21.9 08/14/2020 1844   TCO2 23 08/14/2020 1844   ACIDBASEDEF 4.0 (H) 08/14/2020 1844   O2SAT 100.0 08/14/2020 1844     Coagulation Profile: No results for input(s): INR, PROTIME in the last 168 hours.  Cardiac Enzymes: No results for input(s): CKTOTAL, CKMB, CKMBINDEX, TROPONINI in the last 168 hours.  HbA1C: Hgb A1c MFr Bld  Date/Time Value Ref Range Status  08/14/2020 06:44 PM 5.5 4.8 - 5.6 % Final    Comment:    (NOTE)         Prediabetes: 5.7 - 6.4         Diabetes: >6.4         Glycemic control for adults with diabetes: <7.0     CBG: No results for input(s): GLUCAP in the last 168 hours.  Review of Systems:   Unable to perform review of systems as patient is sedated  Past Medical History:  She,  has a past medical history of Carotid bruit, Dyslipidemia, PAD (peripheral artery disease) ([place]), Pneumonia, Throat cancer ([place]), and Tobacco dependence.   Surgical History:   Past Surgical History:  Procedure Laterality Date   AMPUTATION Right 12/18/2014   Procedure: AMPUTATION ABOVE KNEE;  Surgeon: Elam Dutch, MD;  Location: [place];  Service: Vascular;  Laterality: Right;   AORTA - BILATERAL FEMORAL ARTERY BYPASS GRAFT     COLONOSCOPY     FALSE ANEURYSM REPAIR Left 12/30/2015   Procedure: Excision of Left FEMORAL PSEUDO-ANEURYSM;  Surgeon: Waynetta Sandy, MD;  Location: [place];  Service: Vascular;  Laterality: Left;   FEMORAL ARTERY EXPLORATION Left 08/14/2020   Procedure: Repair Left Femoral Pseudoaneurysm;  Surgeon: Rosetta Posner,  MD;  Location: [place];  Service: Vascular;  Laterality: Left;   FEMORAL-FEMORAL BYPASS GRAFT Left 12/30/2015   Procedure: BYPASS From Aorta-Femoral to Femoral Bypass Graft using Dacron Graft;  Surgeon: Waynetta Sandy, MD;  Location: [place];  Service: Vascular;  Laterality: Left;   LOWER EXTREMITY ANGIOGRAM Bilateral 12/14/2014   Procedure: Lower Extremity Angiogram;  Surgeon: Elam Dutch, MD;  Location: [facility];  Service: Cardiovascular;  Laterality: Bilateral;   PEG TUBE PLACEMENT     PEG TUBE REMOVAL     PERIPHERAL VASCULAR CATHETERIZATION N/A 12/14/2014   Procedure: Abdominal Aortogram;  Surgeon: Elam Dutch, MD;  Location: [facility];  Service: Cardiovascular;  Laterality: N/A;   TONSILLECTOMY     TOTAL LARYNGECTOMY       Social History:   reports that she has been smoking cigarettes. She has a 35.00 pack-year smoking history. She has never used smokeless tobacco. She reports that she does not drink alcohol and does not use drugs.   Family History:  Her family history includes Cancer in her mother; Lung cancer in her brother.   Allergies No Known Allergies   Home Medications  Prior  to Admission medications   Medication Sig Start Date End Date Taking? Authorizing Provider  aspirin EC 81 MG tablet Take by mouth.    [provider]  atorvastatin (LIPITOR) 10 MG tablet Take 1 tablet by mouth daily. 08/23/20   [provider]  feeding supplement (ENSURE ENLIVE / ENSURE PLUS) LIQD Take 237 mLs by mouth 2 (two) times daily between meals. 08/19/20   Little Ishikawa, MD  furosemide (LASIX) 20 MG tablet Take 20 mg by mouth daily as needed. 08/23/20   [provider]  gabapentin (NEURONTIN) 400 MG capsule Take 400 mg by mouth 3 (three) times daily. 11/26/14   [provider]  lisinopril (ZESTRIL) 10 MG tablet Take 10 mg by mouth daily.    [provider]  Potassium Chloride ER 20 MEQ TBCR Take by mouth. 08/23/20    [provider]  senna-docusate (SENOKOT-S) 8.6-50 MG tablet Take 1 tablet by mouth at bedtime as needed for mild constipation. 08/19/20   Little Ishikawa, MD     Critical care time: 52 minutes    Freda Jackson, MD [facility] Office: [phone]   See Amion for personal pager PCCM on call pager [phone] until 7pm. Please call [place] 7p-7a. [phone]

## 2020-10-20 NOTE — Progress Notes (Signed)
RT pulled ETT back approximately 3cm per MD order. Unable to gage exactly how far due to ETT positioned in stoma and numerical markings on ETT stop before reaching insertion point at stoma. CXR pending. PIP and VT are within acceptable range and BBS are equal. RT made changes to Ventilator settings per ABG results and MD order. RT will obtain ABG aprroximately 1 hour from this time. RT will continue to monitor patient as needed.

## 2020-10-20 NOTE — Anesthesia Procedure Notes (Signed)
Date/Time: 10/24/2020 2:38 PM Performed by: Babs Bertin, CRNA Pre-anesthesia Checklist: Patient identified, Emergency Drugs available, Suction available and Patient being monitored Patient Re-evaluated:Patient Re-evaluated prior to induction Oxygen Delivery Method: Circle System Utilized Preoxygenation: Pre-oxygenation with 100% oxygen Induction Type: IV induction Tube size: 6.5 (reinforced tube inserted via stoma) mm Number of attempts: 1 Placement Confirmation: positive ETCO2 and breath sounds checked- equal and bilateral Tube secured with: Tape

## 2020-10-20 NOTE — ED Notes (Signed)
Pt's heparin continued at 500 units per hour per vascular surgeon at bedside.

## 2020-10-20 NOTE — Consult Note (Signed)
[facility] [place]  ASSESSMENT / PLAN: 75 y.o. female with thrombosis of left aortobifemoral limb as well as recent left femoral limb to profunda femoris bypass done 08/14/20. She reports pain in her left leg started about three weeks ago. She has a nonviable left lower extremity below the knee.  She has erythema and a small sinus with fibrinous exudate in her groin.  She needs a left above-knee amputation, but does not have the inflow to heal.  I offered her palliative care, which I think is appropriate in this case.  She does not seem interested in this and wants to do anything possible to extend her life.  She needs an inflow operation to heal even an above-knee amputation.  We will plan to do lateral exposure of the left profunda femoris artery to avoid a 4x redo procedure through a possibly infected left groin.  Her only option for inflow is a left axillary femoral bypass.  After this is completed we can do a left above-knee amputation.  We could then approach the left groin in an elective fashion later this week.  CHIEF COMPLAINT: left leg pain  HISTORY OF PRESENT ILLNESS: Heather Jimenez is a 75 y.o. female well-known to our service.  We last saw her in early June and treatment of a left groin bleeding pseudoaneurysm.  Her vascular history is significant for an aortobifemoral bypass in 2010 with Dr. Oneida Alar.  She underwent a right above-knee amputation in 2016 with Dr. Oneida Alar.  She underwent a left femoral pseudoaneurysm excision and revision with 8 mm Dacron in 2017 with Dr. Donzetta Matters.  Most recently 08/14/2020 she underwent excision of femoral pseudoaneurysm with bypass from left limb of the aortofemoral graft to left profunda femoris artery with Dr. Donnetta Hutching.  The patient presents to [facility] ER as a transfer from [facility].  She reports a 3-week course of progressive left lower extremity pain.  She has no motor or sensory function about the left toes, foot, ankle, or  calf.   Past Medical History:  Diagnosis Date   Carotid bruit    Dyslipidemia    PAD (peripheral artery disease) ([place])    Pneumonia    Throat cancer ([place])    Tobacco dependence     Past Surgical History:  Procedure Laterality Date   AMPUTATION Right 12/18/2014   Procedure: AMPUTATION ABOVE KNEE;  Surgeon: Elam Dutch, MD;  Location: [place];  Service: Vascular;  Laterality: Right;   AORTA - BILATERAL FEMORAL ARTERY BYPASS GRAFT     COLONOSCOPY     FALSE ANEURYSM REPAIR Left 12/30/2015   Procedure: Excision of Left FEMORAL PSEUDO-ANEURYSM;  Surgeon: Waynetta Sandy, MD;  Location: [place];  Service: Vascular;  Laterality: Left;   FEMORAL ARTERY EXPLORATION Left 08/14/2020   Procedure: Repair Left Femoral Pseudoaneurysm;  Surgeon: Rosetta Posner, MD;  Location: [place];  Service: Vascular;  Laterality: Left;   FEMORAL-FEMORAL BYPASS GRAFT Left 12/30/2015   Procedure: BYPASS From Aorta-Femoral to Femoral Bypass Graft using Dacron Graft;  Surgeon: Waynetta Sandy, MD;  Location: [place];  Service: Vascular;  Laterality: Left;   LOWER EXTREMITY ANGIOGRAM Bilateral 12/14/2014   Procedure: Lower Extremity Angiogram;  Surgeon: Elam Dutch, MD;  Location: [place] [facility];  Service: Cardiovascular;  Laterality: Bilateral;   PEG TUBE PLACEMENT     PEG TUBE REMOVAL     PERIPHERAL VASCULAR CATHETERIZATION N/A 12/14/2014   Procedure: Abdominal Aortogram;  Surgeon: Elam Dutch, MD;  Location: [place] [facility];  Service: Cardiovascular;  Laterality: N/A;   TONSILLECTOMY     TOTAL LARYNGECTOMY      Family History  Problem Relation Age of Onset   Cancer Mother    Lung cancer Brother     Social History   Socioeconomic History   Marital status: Married    Spouse name: Not on file   Number of children: Not on file   Years of education: Not on file   Highest education level: Not on file  Occupational History   Not on file  Tobacco Use   Smoking status: Light  Smoker    Packs/day: 1.00    Years: 35.00    Pack years: 35.00    Types: Cigarettes   Smokeless tobacco: Never  Substance and Sexual Activity   Alcohol use: No    Alcohol/week: 0.0 standard drinks   Drug use: No   Sexual activity: Not on file  Other Topics Concern   Not on file  Social History Narrative   Not on file   Social Determinants of Health   Financial Resource Strain: Not on file  Food Insecurity: Not on file  Transportation Needs: Not on file  Physical Activity: Not on file  Stress: Not on file  Social Connections: Not on file  Intimate Partner Violence: Not on file    No Known Allergies  No current facility-administered medications for this encounter.   Current Outpatient Medications  Medication Sig Dispense Refill   aspirin EC 81 MG tablet Take by mouth.     atorvastatin (LIPITOR) 10 MG tablet Take 1 tablet by mouth daily.     feeding supplement (ENSURE ENLIVE / ENSURE PLUS) LIQD Take 237 mLs by mouth 2 (two) times daily between meals. 237 mL 12   furosemide (LASIX) 20 MG tablet Take 20 mg by mouth daily as needed.     gabapentin (NEURONTIN) 400 MG capsule Take 400 mg by mouth 3 (three) times daily.  12   lisinopril (ZESTRIL) 10 MG tablet Take 10 mg by mouth daily.     Potassium Chloride ER 20 MEQ TBCR Take by mouth.     senna-docusate (SENOKOT-S) 8.6-50 MG tablet Take 1 tablet by mouth at bedtime as needed for mild constipation. 30 tablet 0    REVIEW OF SYSTEMS:  [X]  denotes positive finding, [ ]  denotes negative finding Cardiac  Comments:  Chest pain or chest pressure:    Shortness of breath upon exertion:    Short of breath when lying flat:    Irregular heart rhythm:        Vascular    Pain in calf, thigh, or hip brought on by ambulation:    Pain in feet at night that wakes you up from your sleep:     Blood clot in your veins:    Leg swelling:         Pulmonary    Oxygen at home:    Productive cough:     Wheezing:         Neurologic     Sudden weakness in arms or legs:     Sudden numbness in arms or legs:     Sudden onset of difficulty speaking or slurred speech:    Temporary loss of vision in one eye:     Problems with dizziness:         Gastrointestinal    Blood in stool:     Vomited blood:  Genitourinary    Burning when urinating:     Blood in urine:        Psychiatric    Major depression:         Hematologic    Bleeding problems:    Problems with blood clotting too easily:        Skin    Rashes or ulcers:        Constitutional    Fever or chills:      PHYSICAL EXAM Vitals:   11/08/2020 1101  BP: (!) 125/98  Pulse: (!) 112  Resp: 18  Temp: 97.9 F (36.6 C)  TempSrc: Oral  SpO2: 98%    Constitutional: Cachectic.  Chronically ill.   Neurologic: CN intact. no focal findings.  Left lower extremity has no motor or sensory function about the toes, foot, ankle.  She can flex and extend her knee.  She can flex and extend her hip.  She has sensation about the knee. Psychiatric:  Mood and affect symmetric and appropriate. Eyes:  No icterus. No conjunctival pallor. Ears, nose, throat: Status post laryngectomy.  Speaks with voicebox.  Mucous membranes moist.  Cardiac: rate and rhythm.  Respiratory:  unlabored. Abdominal:  soft, non-tender, non-distended.  Peripheral vascular: Right above-knee amputation well-healed.  Left leg ischemic with no capillary refill to the thigh.  No palpable pulses in the left lower extremity.  No Doppler flow detected in the left lower extremity. Extremity: No edema. No cyanosis. + pallor.  Skin: No gangrene. No ulceration.  Lymphatic: No Stemmer's sign. No palpable lymphadenopathy.  PERTINENT LABORATORY AND RADIOLOGIC DATA  Most recent CBC CBC Latest Ref Rng & Units 08/19/2020 08/18/2020 08/17/2020  WBC 4.0 - 10.5 K/uL 7.8 8.1 9.7  Hemoglobin 12.0 - 15.0 g/dL 10.6(L) 11.2(L) 10.8(L)  Hematocrit 36.0 - 46.0 % 32.9(L) 34.5(L) 32.9(L)  Platelets 150 - 400 K/uL 205 143(L)  140(L)     Most recent CMP CMP Latest Ref Rng & Units 08/19/2020 08/18/2020 08/17/2020  Glucose 70 - 99 mg/dL 91 81 69(L)  BUN 8 - 23 mg/dL 5(L) 7(L) 7(L)  Creatinine 0.44 - 1.00 mg/dL 0.48 0.55 0.64  Sodium 135 - 145 mmol/L 136 135 136  Potassium 3.5 - 5.1 mmol/L 3.7 3.6 3.3(L)  Chloride 98 - 111 mmol/L 103 105 105  CO2 22 - 32 mmol/L 27 23 24   Calcium 8.9 - 10.3 mg/dL 7.6(L) 7.3(L) 7.3(L)  Total Protein 6.5 - 8.1 g/dL - - -  Total Bilirubin 0.3 - 1.2 mg/dL - - -  Alkaline Phos 38 - 126 U/L - - -  AST 15 - 41 U/L - - -  ALT 0 - 44 U/L - - -    Hgb A1c MFr Bld (%)  Date Value  08/14/2020 5.5   [facility] from [facility] reviewed in great detail.  The aorto bifemoral bypass and bypass from 08/14/2020 has thrombosed.  There is some flow to her bilateral renal arteries, no named vessel opacifies in her pelvis below this.  There is reconstitution of the left profunda femoris artery via pelvic collateralization.  This courses to the thigh.  No flow was seen beyond this.   Yevonne Aline Stanford Breed, MD [facility] of [facility] Phone Number: [phone] 10/15/2020 11:33 AM

## 2020-10-20 NOTE — Progress Notes (Signed)
ANTICOAGULATION CONSULT NOTE - Initial Consult  Pharmacy Consult for heparin Indication:  thrombosed aorto bifemoral bypass graft  No Known Allergies  Patient Measurements: Height: 5\' 4"  (162.6 cm) Weight: 34 kg (75 lb) IBW/kg (Calculated) : 54.7 Heparin Dosing Weight: 34 kg  Vital Signs: Temp: 97.9 F (36.6 C) (08/07 1101) Temp Source: Oral (08/07 1101) BP: 119/81 (08/07 1330) Pulse Rate: 109 (08/07 1330)  Labs: No results for input(s): HGB, HCT, PLT, APTT, LABPROT, INR, HEPARINUNFRC, HEPRLOWMOCWT, CREATININE, CKTOTAL, CKMB, TROPONINIHS in the last 72 hours.  CrCl cannot be calculated (Patient's most recent lab result is older than the maximum 21 days allowed.).   Medical History: Past Medical History:  Diagnosis Date   Carotid bruit    Dyslipidemia    PAD (peripheral artery disease) ([place])    Pneumonia    Throat cancer ([place])    Tobacco dependence    Assessment: 75 yo F from [facility] with thrombosed aorto bifermoral bypass graft. Vascular surgery consulted. Heparin already running on patient arrival to [facility] ED. Patient was given heparin 2000 unit bolus at 0800 and started on heparin gtt at 500 units/hr at 0802 (in paperwork at bedside).  Goal of Therapy:  Heparin level 0.3-0.7 units/ml Monitor platelets by anticoagulation protocol: Yes   Plan:  Continue OSH heparin infusion at 500 units/hr Will draw 8h HL at 1600 this afternoon and assess heparin level to make any adjustment. F/u plan for heparin post-op.  Joetta Manners, PharmD, [facility] Emergency Medicine Clinical Pharmacist ED RPh Phone: [place]: [phone]

## 2020-10-20 NOTE — ED Triage Notes (Signed)
Pt arrives via [facility] from [facility] for cold L leg. [facility] reports hospital was unable to find pulses with doppler in L leg. Pt reports this has been ongoing for three weeks. Has hx of blood clot in R leg with AKA

## 2020-10-20 NOTE — Transfer of Care (Signed)
Immediate Anesthesia Transfer of Care Note  Patient: Heather Jimenez  Procedure(s) Performed: BYPASS GRAFT Left AXILLA-Left FEMORAL. (Left: Groin) AMPUTATION ABOVE KNEE (Left: Knee)  Patient Location: PACU and ICU  Anesthesia Type:General  Level of Consciousness: sedated, drowsy and Patient remains intubated per anesthesia plan  Airway & Oxygen Therapy: Patient placed on Ventilator (see vital sign flow sheet for setting)  Post-op Assessment: Report given to RN and Post -op Vital signs reviewed and stable  Post vital signs: Reviewed and stable  Last Vitals:  Vitals Value Taken Time  BP    Temp    Pulse    Resp    SpO2      Last Pain:  Vitals:   11/04/2020 1345  TempSrc: Oral  PainSc:          Complications: No notable events documented.

## 2020-10-20 NOTE — Op Note (Signed)
DATE OF SERVICE: 10/19/2020  PATIENT:  Heather Jimenez  75 y.o. female  PRE-OPERATIVE DIAGNOSIS:  thrombosis of left aortofemoral bypass and left aortofemoral bypass limb to profunda femoris bypass causing non-salvageable left lower extremity  POST-OPERATIVE DIAGNOSIS:  Same  PROCEDURE:   1) left axillary - profunda femoris bypass (55mm ePTFE) 2) left above knee amputation  SURGEON:  Surgeon(s) and Role:    * Cherre Robins, MD - Primary  ASSISTANT: Arlee Muslim, PA-C  An assistant was required to facilitate exposure and expedite the case.  ANESTHESIA:   general  EBL: 153mL  BLOOD ADMINISTERED: 2 units packed red blood cell  DRAINS: none   LOCAL MEDICATIONS USED:  NONE  SPECIMEN:  none  COUNTS: confirmed correct.  TOURNIQUET:  none  PATIENT DISPOSITION:  PACU - hemodynamically stable.   Delay start of Pharmacological VTE agent (>24hrs) due to surgical blood loss or risk of bleeding: no  INDICATION FOR PROCEDURE: Heather Jimenez is a 75 y.o. female with complex vascular history.  She underwent an aortobifemoral bypass in 2010.  This was followed by 2 left limb pseudoaneurysms which required open repair.  The most recent was 08/14/2020.  Unfortunately her bypass and aortobifemoral limb thrombosed about 3 weeks ago causing a profoundly ischemic left leg below the knee.  I counseled the patient extensively that this is a life-threatening situation.  Simple amputation would likely not heal given the lack of inflow to the leg.  Patient wishes to be aggressive and desired surgical therapy.  After careful discussion of risks, benefits, and alternatives the patient was offered left axillary to profunda femoris bypass and left above-knee amputation. The patient understood and wished to proceed.  OPERATIVE FINDINGS: Unremarkable lateral exposure of profunda femoris branch.  Left axillary artery exposed 1 fingerbreadth below the left clavicle.  Pectoralis minor muscle partially divided to  expose axillary artery.  Good technical result from left axillary to profunda femoris bypass with brisk Doppler flow and profunda upon completion.  Left above-knee amputation unremarkable.  Good healthy bleeding tissue at margin of amputation.  Several muscles in the anterior and posterior compartment did not fasciculate normally.  DESCRIPTION OF PROCEDURE: After identification of the patient in the pre-operative holding area, the patient was transferred to the operating room. The patient was positioned supine on the operating room table. Anesthesia was induced. The left chest, flank, groin, and lower extremity were prepped and draped in standard fashion. A surgical pause was performed confirming correct patient, procedure, and operative location.  Using intraoperative ultrasound we mapped the course of the profunda femoris artery.  Preoperative imaging suggested the main trunk of the profunda femoris was patent via collateral flow.  An incision lateral to the sartorius muscle was made in unscarred leg and carried down through subcutaneous tissue.  The sartorius was rolled medially.  Blunt dissection was carried down to a large venous branch.  Under this we identified a branch of the profunda femoris.  We exposed this for about 3 cm in the leg.  A longitudinal incision was made about a fingerbreadth below the left clavicle.  This is carried down through subcutaneous tissue.  She had no pectoralis major muscle, likely from previous radical mastectomy.  The pectoralis minor muscle was identified where it inserted on the coracoid process.  This was partially divided.  The axillary artery was identified just below the muscle.  The artery was skeletonized and a Cooley clamp slotted to fit for vascular control.  A subcutaneous tunnel was created  between the 2 exposures taking great care to avoid injuring the chest or abdomen.  A 6 mm externally supported ePTFE vascular graft was delivered through the tunneler.   The sheath tunneler was removed.  The graft was spatulated to allow end-to-side anastomosis at the axillary artery.  The patient was systemically heparinized.  Activated clotting time measurements were used at the case to confirm adequate anticoagulation.  The axillary artery was clamped.  Longitudinal arteriotomy was made with 11 blade extended with Potts scissors.  An end-to-side anastomosis was performed with the spatulated end of the vascular graft using continuous running suture of 6-0 Prolene.  The anastomosis was completed and flushed down the open end of the graft.  The anastomosis was packed for hemostasis.  I fistula clamp was applied to the graft near the anastomosis for hemostasis.  The distal end of the vascular graft was spatulated to allow end-to-side anastomosis to the profunda femoris artery.  The profunda femoris was clamped proximally distally with baby Gregory clamps.  A longitudinal arteriotomy was made with 11 blade extended with Potts scissors.  The spatulated end of the vascular graft was anastomosed end to side to the arteriotomy using continuous running suture of 6-0 Prolene.  Immediately prior to completion the graft was flushed and de-aired.  The anastomosis was completed.  Hemostasis was achieved in the anastomosis.  Heparin was reversed with protamine.  The anastomotic surgical beds were packed.  A generous left thigh fishmouth incision was planned on the left lower extremity with a skin marker. A sterile tourniquet was applied to the proximal thigh. An esmarch tourniquet was used to exsanguinate the leg. The pneumatic tourniquet was inflated. An incision was made as planned. The incision was carried down with a #21 scalpel through the subcutaneous tissue, and through the posterior and anterior compartments of the thigh until the femur was encountered. The periosteum about the femur was elevated as high as possible. The femur was transected with a powered saw. The vascular  bundle was found in its typical position, and was suture-ligated proximally using a 2-0 silk suture.  The sciatic nerve was identified in typical position, retracted, transected at the retracted margin, and ligated with a 2-0 silk suture.  The tourniquet was released. Hemostasis was achieved in the surgical bed. The wound was copiously irrigated. A myodesis was performed with 2-0 Vicryl. The wound was closed in layers using 2-0 Vicryl and surgical stapler.  A clean bandage was applied.  The axillary incision was closed using 2-0 Vicryl, 3-0 Vicryl for Monocryl.  The groin incision was closed using 2-0 Vicryl, 2-0 nylon, surgical stapler.  Sterile bandages were applied.  Upon completion of the case instrument and sharps counts were confirmed correct. The patient was transferred to the ICU critically ill and intubated. I was present for all portions of the procedure.  Yevonne Aline. Stanford Breed, MD [facility] Phone Number: [phone] 10/19/2020 5:36 PM

## 2020-10-20 NOTE — ED Provider Notes (Signed)
Emergency Medicine Provider Triage Evaluation Note  Heather Jimenez , a 76 y.o. female  was transferred form another ED.  Pt complains of possible acute vascular occlusion of her left leg.  Patient was seen at [facility] emergency room today.  She was felt to have acute vascular occlusion of her left leg.  She was transferred to see Dr. Roselie Awkward vascular surgery.  Patient does have history of prior right above-the-knee amputation.  She has been followed by vascular surgery.  Patient also had repair of left femoral false aneurysm and prior aortofemoral bypass on that same side.  Review of Systems  Positive: Leg pain Negative: No dyspnea  Physical Exam  BP (!) 125/98 (BP Location: Left Arm)   Pulse (!) 112   Temp 97.9 F (36.6 C) (Oral)   Resp 18   SpO2 98%  Gen:   Awake, no distress   Resp:  Normal effort  MSK:   Lef leg lischemia Other:    Medical Decision Making  11:08 AM Dr. Stanford Breed is at the bedside evaluating the patient.  Plan is for pt to go to OR today.     Dorie Rank, MD 10/24/2020 [phone]

## 2020-10-20 NOTE — Anesthesia Procedure Notes (Signed)
Central Venous Catheter Insertion Performed by: Roderic Palau, MD, anesthesiologist Start/End08/05/2020 2:40 PM, 10/19/2020 2:55 PM Patient location: Pre-op. Preanesthetic checklist: patient identified, IV checked, site marked, risks and benefits discussed, surgical consent, monitors and equipment checked, pre-op evaluation, timeout performed and anesthesia consent Position: Trendelenburg Lidocaine 1% used for infiltration and patient sedated Hand hygiene performed , maximum sterile barriers used  and Seldinger technique used Catheter size: 8.5 Fr Total catheter length 10. Central line was placed.Sheath introducer Procedure performed using ultrasound guided technique. Ultrasound Notes:anatomy identified, needle tip was noted to be adjacent to the nerve/plexus identified, no ultrasound evidence of intravascular and/or intraneural injection and image(s) printed for medical record Attempts: 1 Following insertion, line sutured, dressing applied and Biopatch. Post procedure assessment: blood return through all ports, free fluid flow and no air  Patient tolerated the procedure well with no immediate complications. Additional procedure comments: Triple lumen slick catheter placed in the swan port.Marland Kitchen

## 2020-10-20 NOTE — Anesthesia Procedure Notes (Signed)
Arterial Line Insertion Start/End08/25/2022 3:15 AM, 10/28/2020 3:25 AM Performed by: Roderic Palau, MD  Patient location: OR. Preanesthetic checklist: patient identified, IV checked, site marked, risks and benefits discussed, surgical consent, monitors and equipment checked, pre-op evaluation, timeout performed and anesthesia consent Lidocaine 1% used for infiltration Right, brachial was placed Catheter size: 20 G Hand hygiene performed , maximum sterile barriers used  and Seldinger technique used  Attempts: 1 Procedure performed using ultrasound guided technique. Ultrasound Notes:anatomy identified, needle tip was noted to be adjacent to the nerve/plexus identified, no ultrasound evidence of intravascular and/or intraneural injection and image(s) printed for medical record Following insertion, dressing applied, Biopatch and line sutured. Post procedure assessment: normal and unchanged  Patient tolerated the procedure well with no immediate complications.

## 2020-10-20 NOTE — Progress Notes (Addendum)
[place] Progress Note Patient Name: Heather Jimenez DOB: 1945-12-17 MRN: [id number]   Date of Service  11/12/2020  HPI/Events of Note  E link was asked to follow up on CXR and labs  eICU Interventions  CXR - patient has an ETT placed in her trach stoma. The tube is sitting just at the carina. Saw her on camera. No distress and RN was to start a fentanyl drip. Peak pressure 24. O2 sat was 90 on 60%/peep 5. D/w bedside ICU team as well. RT is going to withdraw the tube by 3 cm and we will repeat another CXR after that. ABG also noted, RT had just gone up on RR to 18 so we will repeat an ABG in 1 hour as well.   IN addition, labs reviewed. Has a PRN mag repletion order in place. Other labs look ok.   D/w RT over camera as well.      Intervention Category Major Interventions: Respiratory failure - evaluation and management;Other:  Heather Jimenez 11/04/2020, 8:52 PM  9:15 pm Lactic acid reported as 5 from earlier labs Got a NS bolus earlier 500 cc LR x 1 ordered Recheck LA at midnight   10:15 pm CXR noted, not much change. RT note reviewed. ETT is just above carina and not in any mainstem bronchus at this time. O2 sat is 100. Fio2 being lowered Patient comfortable on precedex and fentanyl drips. Of note, is still getting the NS bolus that was ordered by day team so I discontinued LR bolus. Repeat LA was already sent. Patient has been weaned off levophed as well as neo.   11:45 pm ABG noted Improved. Repeat in AM Was also notified that patient was restarted on pressors after sedation added. Continue as planned.

## 2020-10-21 ENCOUNTER — Inpatient Hospital Stay (HOSPITAL_COMMUNITY): Payer: Medicare PPO

## 2020-10-21 ENCOUNTER — Encounter (HOSPITAL_COMMUNITY): Payer: Self-pay | Admitting: Vascular Surgery

## 2020-10-21 DIAGNOSIS — I7090 Unspecified atherosclerosis: Secondary | ICD-10-CM | POA: Diagnosis not present

## 2020-10-21 LAB — BASIC METABOLIC PANEL
Anion gap: 9 (ref 5–15)
BUN: 9 mg/dL (ref 8–23)
CO2: 16 mmol/L — ABNORMAL LOW (ref 22–32)
Calcium: 7.3 mg/dL — ABNORMAL LOW (ref 8.9–10.3)
Chloride: 107 mmol/L (ref 98–111)
Creatinine, Ser: 0.74 mg/dL (ref 0.44–1.00)
GFR, Estimated: >60 mL/min (ref >60)
Glucose, Bld: 277 mg/dL — ABNORMAL HIGH (ref 70–99)
Potassium: 4.2 mmol/L (ref 3.5–5.1)
Sodium: 132 mmol/L — ABNORMAL LOW (ref 135–145)

## 2020-10-21 LAB — POCT I-STAT 7, (LYTES, BLD GAS, ICA,H+H)
Acid-base deficit: 8 mmol/L — ABNORMAL HIGH (ref 0.0–2.0)
Acid-base deficit: 7 mmol/L — ABNORMAL HIGH (ref 0.0–2.0)
Bicarbonate: 17.1 mmol/L — ABNORMAL LOW (ref 20.0–28.0)
Bicarbonate: 20.4 mmol/L (ref 20.0–28.0)
Calcium, Ion: 1.12 mmol/L — ABNORMAL LOW (ref 1.15–1.40)
Calcium, Ion: 1.02 mmol/L — ABNORMAL LOW (ref 1.15–1.40)
HCT: 36 % (ref 36.0–46.0)
HCT: 31 % — ABNORMAL LOW (ref 36.0–46.0)
Hemoglobin: 12.2 g/dL (ref 12.0–15.0)
Hemoglobin: 10.5 g/dL — ABNORMAL LOW (ref 12.0–15.0)
O2 Saturation: 99 %
O2 Saturation: 98 %
Patient temperature: 37.3
Patient temperature: 98
Potassium: 4.2 mmol/L (ref 3.5–5.1)
Potassium: 3.8 mmol/L (ref 3.5–5.1)
Sodium: 136 mmol/L (ref 135–145)
Sodium: 138 mmol/L (ref 135–145)
TCO2: 18 mmol/L — ABNORMAL LOW (ref 22–32)
TCO2: 22 mmol/L (ref 22–32)
pCO2 arterial: 34.1 mmHg (ref 32.0–48.0)
pCO2 arterial: 45.4 mmHg (ref 32.0–48.0)
pH, Arterial: 7.309 — ABNORMAL LOW (ref 7.350–7.450)
pH, Arterial: 7.258 — ABNORMAL LOW (ref 7.350–7.450)
pO2, Arterial: 133 mmHg — ABNORMAL HIGH (ref 83.0–108.0)
pO2, Arterial: 115 mmHg — ABNORMAL HIGH (ref 83.0–108.0)

## 2020-10-21 LAB — SARS CORONAVIRUS 2 (TAT 6-24 HRS): SARS Coronavirus 2: POSITIVE — AB

## 2020-10-21 LAB — CBC
HCT: 36.7 % (ref 36.0–46.0)
Hemoglobin: 12.3 g/dL (ref 12.0–15.0)
MCH: 29.9 pg (ref 26.0–34.0)
MCHC: 33.5 g/dL (ref 30.0–36.0)
MCV: 89.1 fL (ref 80.0–100.0)
Platelets: 308 10*3/uL (ref 150–400)
RBC: 4.12 MIL/uL (ref 3.87–5.11)
RDW: 14.6 % (ref 11.5–15.5)
WBC: 20.7 10*3/uL — ABNORMAL HIGH (ref 4.0–10.5)
nRBC: 0 % (ref 0.0–0.2)

## 2020-10-21 LAB — MAGNESIUM: Magnesium: 1.7 mg/dL (ref 1.7–2.4)

## 2020-10-21 LAB — HEMOGLOBIN A1C
Hgb A1c MFr Bld: 6.3 % — ABNORMAL HIGH (ref 4.8–5.6)
Mean Plasma Glucose: 134.11 mg/dL

## 2020-10-21 LAB — GLUCOSE, CAPILLARY
Glucose-Capillary: 204 mg/dL — ABNORMAL HIGH (ref 70–99)
Glucose-Capillary: 160 mg/dL — ABNORMAL HIGH (ref 70–99)
Glucose-Capillary: 113 mg/dL — ABNORMAL HIGH (ref 70–99)
Glucose-Capillary: 123 mg/dL — ABNORMAL HIGH (ref 70–99)

## 2020-10-21 LAB — RESP PANEL BY RT-PCR (FLU A&B, COVID) ARPGX2
Influenza A by PCR: NEGATIVE
Influenza B by PCR: NEGATIVE
SARS Coronavirus 2 by RT PCR: POSITIVE — AB

## 2020-10-21 LAB — LACTIC ACID, PLASMA: Lactic Acid, Venous: 2.5 mmol/L (ref 0.5–1.9)

## 2020-10-21 LAB — PHOSPHORUS: Phosphorus: 4.5 mg/dL (ref 2.5–4.6)

## 2020-10-21 LAB — CORTISOL: Cortisol, Plasma: 23.5 ug/dL

## 2020-10-21 MED ORDER — CHLORHEXIDINE GLUCONATE 0.12% ORAL RINSE (MEDLINE KIT)
15.0000 mL | Freq: Two times a day (BID) | OROMUCOSAL | Status: DC
  Administered 2020-10-21: 15 mL via OROMUCOSAL

## 2020-10-21 MED ORDER — ADULT MULTIVITAMIN W/MINERALS CH
1.0000 | ORAL_TABLET | Freq: Every day | ORAL | Status: DC
  Administered 2020-10-21: 1 via ORAL
  Filled 2020-10-21: qty 1

## 2020-10-21 MED ORDER — SODIUM CHLORIDE 0.9% FLUSH
10.0000 mL | INTRAVENOUS | Status: DC | PRN
  Administered 2020-10-21 (×2): 10 mL

## 2020-10-21 MED ORDER — CHLORHEXIDINE GLUCONATE 0.12 % MT SOLN
15.0000 mL | Freq: Two times a day (BID) | OROMUCOSAL | Status: DC
  Administered 2020-10-21 – 2020-10-23 (×4): 15 mL via OROMUCOSAL
  Filled 2020-10-21 (×2): qty 15

## 2020-10-21 MED ORDER — BARICITINIB 2 MG PO TABS
2.0000 mg | ORAL_TABLET | Freq: Every day | ORAL | Status: DC
  Administered 2020-10-21: 2 mg via ORAL
  Filled 2020-10-21 (×3): qty 1

## 2020-10-21 MED ORDER — INSULIN ASPART 100 UNIT/ML IJ SOLN
0.0000 [IU] | Freq: Three times a day (TID) | INTRAMUSCULAR | Status: DC
  Administered 2020-10-21: 2 [IU] via SUBCUTANEOUS
  Administered 2020-10-21: 3 [IU] via SUBCUTANEOUS

## 2020-10-21 MED ORDER — INSULIN ASPART 100 UNIT/ML IJ SOLN: 0.0000 [IU] | Freq: Every day | INTRAMUSCULAR | Status: DC

## 2020-10-21 MED ORDER — ORAL CARE MOUTH RINSE
15.0000 mL | Freq: Two times a day (BID) | OROMUCOSAL | Status: DC
  Administered 2020-10-21 – 2020-10-23 (×3): 15 mL via OROMUCOSAL

## 2020-10-21 MED ORDER — REMDESIVIR 100 MG IV SOLR
100.0000 mg | Freq: Every day | INTRAVENOUS | Status: DC
  Administered 2020-10-22: 100 mg via INTRAVENOUS
  Filled 2020-10-21 (×2): qty 20

## 2020-10-21 MED ORDER — DEXAMETHASONE SODIUM PHOSPHATE 10 MG/ML IJ SOLN
6.0000 mg | INTRAMUSCULAR | Status: DC
  Administered 2020-10-21 – 2020-10-22 (×2): 6 mg via INTRAVENOUS
  Filled 2020-10-21 (×3): qty 0.6

## 2020-10-21 MED ORDER — ORAL CARE MOUTH RINSE
15.0000 mL | OROMUCOSAL | Status: DC
  Administered 2020-10-21 (×2): 15 mL via OROMUCOSAL

## 2020-10-21 MED ORDER — FOLIC ACID 1 MG PO TABS
1.0000 mg | ORAL_TABLET | Freq: Every day | ORAL | Status: DC
  Administered 2020-10-21: 1 mg via ORAL
  Filled 2020-10-21: qty 1

## 2020-10-21 MED ORDER — SODIUM BICARBONATE 8.4 % IV SOLN
100.0000 meq | Freq: Once | INTRAVENOUS | Status: AC
  Administered 2020-10-21: 100 meq via INTRAVENOUS
  Filled 2020-10-21: qty 50

## 2020-10-21 MED ORDER — HYDROCOD POLST-CPM POLST ER 10-8 MG/5ML PO SUER
5.0000 mL | Freq: Two times a day (BID) | ORAL | Status: DC | PRN
Start: 2020-10-21 — End: 2020-10-23

## 2020-10-21 MED ORDER — INSULIN ASPART 100 UNIT/ML IJ SOLN
0.0000 [IU] | Freq: Three times a day (TID) | INTRAMUSCULAR | Status: DC
Start: 2020-10-21 — End: 2020-10-23
  Administered 2020-10-22: 2 [IU] via SUBCUTANEOUS
  Administered 2020-10-23: 8 [IU] via SUBCUTANEOUS
  Administered 2020-10-23: 3 [IU] via SUBCUTANEOUS

## 2020-10-21 MED ORDER — GUAIFENESIN-DM 100-10 MG/5ML PO SYRP: 10.0000 mL | ORAL_SOLUTION | ORAL | Status: DC | PRN

## 2020-10-21 MED ORDER — FUROSEMIDE 10 MG/ML IJ SOLN
40.0000 mg | Freq: Four times a day (QID) | INTRAMUSCULAR | Status: AC
  Administered 2020-10-21 (×2): 40 mg via INTRAVENOUS
  Filled 2020-10-21 (×2): qty 4

## 2020-10-21 MED ORDER — SODIUM CHLORIDE 0.9% FLUSH
10.0000 mL | Freq: Two times a day (BID) | INTRAVENOUS | Status: DC
Start: 2020-10-21 — End: 2020-10-23
  Administered 2020-10-21 – 2020-10-22 (×4): 10 mL
  Administered 2020-10-22: 30 mL

## 2020-10-21 MED ORDER — ALBUMIN HUMAN 25 % IV SOLN
25.0000 g | Freq: Four times a day (QID) | INTRAVENOUS | Status: AC
Start: 2020-10-21 — End: 2020-10-22
  Administered 2020-10-21 – 2020-10-22 (×4): 25 g via INTRAVENOUS
  Filled 2020-10-21 (×4): qty 100

## 2020-10-21 MED ORDER — CHLORHEXIDINE GLUCONATE CLOTH 2 % EX PADS
6.0000 | MEDICATED_PAD | Freq: Every day | CUTANEOUS | Status: DC
  Administered 2020-10-21 – 2020-10-22 (×2): 6 via TOPICAL

## 2020-10-21 MED ORDER — MAGNESIUM SULFATE 2 GM/50ML IV SOLN
2.0000 g | Freq: Once | INTRAVENOUS | Status: AC
  Administered 2020-10-21: 2 g via INTRAVENOUS
  Filled 2020-10-21: qty 50

## 2020-10-21 MED ORDER — THIAMINE HCL 100 MG PO TABS
100.0000 mg | ORAL_TABLET | Freq: Every day | ORAL | Status: DC
  Administered 2020-10-21: 100 mg via ORAL
  Filled 2020-10-21: qty 1

## 2020-10-21 MED ORDER — SODIUM CHLORIDE 0.9 % IV SOLN
200.0000 mg | Freq: Once | INTRAVENOUS | Status: AC
  Administered 2020-10-21: 200 mg via INTRAVENOUS
  Filled 2020-10-21: qty 40

## 2020-10-21 NOTE — Procedures (Signed)
Intubation Procedure Note  TERRINA DOCTER  [id number]  [birth year]  Date:10/21/20  Time:5:35 PM   Provider Performing:Iram Astorino, Caryl Pina L    Procedure: Intubation (31497)  Indication(s) Respiratory Failure  Consent Unable to obtain consent due to emergent nature of procedure.   Anesthesia None given patient lethargic. Dr. Tamala Julian at the bedside.    Time Out Verified patient identification, verified procedure, site/side was marked, verified correct patient position, special equipment/implants available, medications/allergies/relevant history reviewed, required imaging and test results available.   Sterile Technique Usual hand hygeine, masks, and gloves were used   Procedure Description Patient positioned in bed supine.  Sedation given as noted above.  Patient was intubated with endotracheal tube using  ETT directly inserted into stoma site .  View was directly visualized.  Number of attempts was 1.  Colorimetric CO2 detector was consistent with tracheal placement.   Complications/Tolerance Patient tolerated procedure well. Chest X-ray is done to verify placement.   EBL none   Specimen(s) None

## 2020-10-21 NOTE — Progress Notes (Addendum)
NAME:  LENDY DITTRICH, MRN:  [id number], DOB:  29-Oct-1945, LOS: 1 ADMISSION DATE:  10/17/2020, CONSULTATION DATE:  11/11/2020 REFERRING MD:  Stanford Breed, CHIEF COMPLAINT:  critical limb ischemia   Brief History   ELAN MCELVAIN is a 80 yom with  throat cancer s/p tracheostomy, peripheral arterial disease, tobacco use who was transferred from [facility] for 3 weeks of left lower extremity pain. She has been admitted by Vascular Surgery for thrombosis of left aortofemoral bypass and left aortofemoral bypass limb to profunda femoris bypass causing non-salvageable left lower extremity. She is status post left axillary - profunda femoris bypass and left above the knee amputation.   PCCM has been consuted for post-operative ventilator management and for hypotension as she is being admitted to the ICU.  Past Medical History  Peripheral arterial disease Throat Cancer Chronic tracheostomy Tobacco Use Significant Hospital Events   8/7--profunda femoris bypass and left above the knee amputation  Consults:  PCCM Micro Data:  MRSA PCR-negative  Antimicrobials:  Cefazolin 8/8>>   Interim history/subjective:  No significant overnight events.   Objective   Blood pressure 120/81, pulse 99, temperature 98.78 F (37.1 C), resp. rate 18, height 5\' 4"  (1.626 m), weight 35 kg, SpO2 100 %.    Vent Mode: PRVC FiO2 (%):  [60 %-100 %] 70 % Set Rate:  [12 bmp-18 bmp] 18 bmp Vt Set:  [300 mL-430 mL] 430 mL PEEP:  [5 cmH20] 5 cmH20 Plateau Pressure:  [18 cmH20-20 cmH20] 20 cmH20   Intake/Output Summary (Last 24 hours) at 10/21/2020 1601 Last data filed at 10/21/2020 0600 Gross per 24 hour  Intake 3390.69 ml  Output 900 ml  Net 2490.69 ml   Filed Weights   11/03/2020 1145 10/21/20 0500  Weight: 34 kg 35 kg    Examination: General: critically ill appearing HENT: oroesophageal temp probe, tracheostomy Cardiac: RRR, upper extremities warm Pulm: on PRVC, lungs clear GI: soft, non-tender Neuro:  awakens to voice, follows commands  Assessment & Plan:   Acute hypoxemic, hypercapnic respiratory failure requiring mechanical ventilation  Chronic tracheostomy stoma COVID 19 + Interval development of b/l effusions -SBT today -wean sedation as able -VAP bundle -will work on figuring out when her initial positive COVID test was to see if she would be a candidate for COVID treatment.  Shock. Likely multifactorial including post op vs septic.  Weaning levo successfully this morning. Lactate improving. AM cortisol normal -echo  -continue to wean pressors for a MAP goal >65  Hyperglycemia -check A1C -CBGs q4h -SSI  Critical limb ischemia s/p left axillary-profunda femoris bypass and left AKA -management per vascular surgery Best practice:  Diet: NPO Pain/Anxiety/Delirium protocol (if indicated): precedex VAP protocol (if indicated): yes DVT prophylaxis: heparin subcut GI prophylaxis: protonix Glucose control: SSI Mobility: BR Code Status: Full Family Communication: per primary Disposition: ICU  Labs   CBC: Recent Labs  Lab 10/16/2020 1832 10/14/2020 2037 10/16/2020 2259 10/21/20 0316 10/21/20 0330  WBC 18.0*  --   --   --  20.7*  NEUTROABS 14.9*  --   --   --   --   HGB 12.0 12.2 10.2* 12.2 12.3  HCT 38.2 36.0 30.0* 36.0 36.7  MCV 93.6  --   --   --  89.1  PLT 320  --   --   --  093    Basic Metabolic Panel: Recent Labs  Lab 10/26/2020 1627 10/19/2020 1738 11/05/2020 1832 10/29/2020 2037 11/01/2020 2259 10/21/20 0316 10/21/20 0330  NA 135   < >  134* 134* 137 136 132*  K 4.2   < > 5.1 4.6 4.1 4.2 4.2  CL 105  --  106  --   --   --  107  CO2  --   --  18*  --   --   --  16*  GLUCOSE 124*  --  318*  --   --   --  277*  BUN 6*  --  8  --   --   --  9  CREATININE 0.30*  --  0.65  --   --   --  0.74  CALCIUM  --   --  8.6*  --   --   --  7.3*  MG  --   --  1.6*  --   --   --  1.7  PHOS  --   --  6.8*  --   --   --  4.5   < > = values in this interval not displayed.    GFR: Estimated Creatinine Clearance: 33.6 mL/min (by C-G formula based on SCr of 0.74 mg/dL). Recent Labs  Lab 11/06/2020 1832 11/07/2020 1833 10/19/2020 2133 10/21/20 0020 10/21/20 0330  WBC 18.0*  --   --   --  20.7*  LATICACIDVEN  --  5.0* 4.4* 2.5*  --     Liver Function Tests: Recent Labs  Lab 10/24/2020 1832  AST 76*  ALT 22  ALKPHOS 71  BILITOT 1.1  PROT 4.4*  ALBUMIN 2.3*   No results for input(s): LIPASE, AMYLASE in the last 168 hours. No results for input(s): AMMONIA in the last 168 hours.  ABG    Component Value Date/Time   PHART 7.309 (L) 10/21/2020 0316   PCO2ART 34.1 10/21/2020 0316   PO2ART 133 (H) 10/21/2020 0316   HCO3 17.1 (L) 10/21/2020 0316   TCO2 18 (L) 10/21/2020 0316   ACIDBASEDEF 8.0 (H) 10/21/2020 0316   O2SAT 99.0 10/21/2020 0316     Coagulation Profile: Recent Labs  Lab 10/31/2020 1832  INR 1.4*    Cardiac Enzymes: No results for input(s): CKTOTAL, CKMB, CKMBINDEX, TROPONINI in the last 168 hours.  HbA1C: Hgb A1c MFr Bld  Date/Time Value Ref Range Status  08/14/2020 06:44 PM 5.5 4.8 - 5.6 % Final    Comment:    (NOTE)         Prediabetes: 5.7 - 6.4         Diabetes: >6.4         Glycemic control for adults with diabetes: <7.0     CBG: No results for input(s): GLUCAP in the last 168 hours.  Mitzi Hansen, MD Internal Medicine Resident PGY-3 [facility] Pager: [phone] 10/21/2020 8:38 AM       10/21/2020 I saw and evaluated the patient. Discussed with resident and agree with resident's findings and plan as documented in the resident's note.  I have seen and evaluated the patient for shock and postop respiratory failure.  S:  No events. Wakes up, follows commands. Pressor requirements improving.  O: Blood pressure 110/77, pulse 85, temperature 98.42 F (36.9 C), resp. rate 18, height 5\' 4"  (1.626 m), weight 35 kg, SpO2 100 %.  No distress Laryngectomy with ETT in place, small thick  secretions Lungs clear, going apneic on vent BL AKA L leg stump dressed Muscle wasting noted  Patient Lines/Drains/Airways Status     Active Line/Drains/Airways     Name Placement date Placement time Site Days  Arterial Line 11/12/2020 Right Brachial 10/22/2020  0315  Brachial  1   Peripheral IV 11/01/2020 20 G Right Antecubital 10/16/2020  1108  Antecubital  1   Peripheral IV 11/01/2020 22 G Left Forearm 11/13/2020  1108  Forearm  1   CVC Triple Lumen 10/19/2020 Right Internal jugular 10/25/2020  --  -- 1   Urethral Catheter Apache Cellar RN Non-latex 16 Fr. 10/28/2020  1530  Non-latex  1   Airway 6.5 mm 11/11/2020  1430  -- 1   Incision (Closed) 12/18/14 Thigh Right 12/18/14  1255  -- 2134   Incision (Closed) 12/30/15 Groin Left 12/30/15  1117  -- 1757   Incision (Closed) 08/14/20 Groin Left 08/14/20  1729  -- 68   Incision (Closed) 08/14/20 Thigh Anterior;Left;Proximal 08/14/20  2000  -- 68   Incision (Closed) 10/16/2020 Axilla Left 10/16/2020  1618  -- 1   Incision (Closed) 11/04/2020 Groin Left 10/28/2020  1618  -- 1   Incision (Closed) 10/21/2020 Thigh Left 10/15/2020  1618  -- 1   Tracheostomy 08/18/20  0451  --  64   Pressure Injury Coccyx Mid Unstageable - Full thickness tissue loss in which the base of the injury is covered by slough (yellow, tan, gray, green or brown) and/or eschar (tan, brown or black) in the wound bed. --  --  -- --   Wound / Incision (Open or Dehisced) 08/16/20 Skin tear Arm Left;Lower;Posterior skin tear from adhesive coming off 08/16/20  0659  Arm  44           Acidemic on ABG Metabolic acidosis with incomplete respiratory compensation Sugars variable WBC up slightly CXR with interstitial edema  A:  Postop respiratory failure related to fluid shifts, poor nutritional status leading to pulmonary edema  Postop shock resolving  Critical limb ischemia s/p left axillary-profunda femoris bypass and left AKA  Incidental COVID positive but was at [place] intermittently for 2  weeks  Pressure ulcers POA  P:  Wean to TC Bicarb push Lasix, albumin Check cycle value for COVID, tx if high Will follow with you  Patient critically ill due to respiratory failure Interventions to address this today vent wean Risk of deterioration without these interventions is high  I personally spent 32 minutes providing critical care not including any separately billable procedures  Erskine Emery MD [facility] Prefer epic messenger for cross cover needs If after hours, please call E-link

## 2020-10-21 NOTE — Evaluation (Signed)
Physical Therapy Evaluation Patient Details Name: Heather Jimenez MRN: [id number] DOB: October 29, 1945 Today's Date: 10/21/2020   History of Present Illness  Heather Jimenez is a 75 year old female admitted 63 by Vascular Surgery for thrombosis of left aortofemoral bypass and left aortofemoral bypass limb to profunda femoris bypass causing non-salvageable left lower extremity. She is status post left axillary - profunda femoris bypass and left above the knee amputation.  PMH :  PAD, throat CA s/p tracheostomy, tobacco dependence.  Clinical Impression  Pt admitted with/for revascularization of L LE and s/p L AKA.  Pt also with COVID at this time.  Pt is generally max to total assist moving in the bed and to EOB.  The evaluation was limited though.  Pt currently limited functionally due to the problems listed below.  (see problems list.)  Pt will benefit from PT to maximize function and safety to be able to get home safely with available assist.     Follow Up Recommendations No PT follow up;Supervision/Assistance - 24 hour    Equipment Recommendations  Other (comment) (TBA as discharge nears .)    Recommendations for Other Services       Precautions / Restrictions Precautions Precautions: Fall      Mobility  Bed Mobility Overal bed mobility: Needs Assistance Bed Mobility: Rolling;Supine to Sit;Sit to Supine Rolling: Max assist   Supine to sit: Total assist Sit to supine: Total assist   General bed mobility comments: pt was fearful of moving ing the bed and to EOB as evidenced by "death grip" on therapist.    Transfers                 General transfer comment: NT  Ambulation/Gait             General Gait Details: NA  Stairs            Wheelchair Mobility    Modified Rankin (Stroke Patients Only)       Balance Overall balance assessment: Needs assistance Sitting-balance support: Single extremity supported;Bilateral upper extremity supported Sitting  balance-Leahy Scale: Poor Sitting balance - Comments: unable to maintain sitting upright with bil UE assist                                     Pertinent Vitals/Pain Pain Assessment: Faces Faces Pain Scale: No hurt Pain Intervention(s): Monitored during session    Home Living Family/patient expects to be discharged to:: Private residence Living Arrangements: Spouse/significant other Available Help at Discharge: Family Type of Home: House Home Access: Ramped entrance     Home Layout: One level Home Equipment: Wheelchair - power;Wheelchair - manual      Prior Function Level of Independence: Needs assistance               Hand Dominance   Dominant Hand: Left    Extremity/Trunk Assessment   Upper Extremity Assessment Upper Extremity Assessment: Generalized weakness    Lower Extremity Assessment Lower Extremity Assessment: Generalized weakness    Cervical / Trunk Assessment Cervical / Trunk Assessment: Kyphotic  Communication      Cognition Arousal/Alertness: Awake/alert;Lethargic Behavior During Therapy: Flat affect;Anxious Overall Cognitive Status:  (NT formally, follows simple commands and yes/no's reliable.)  General Comments General comments (skin integrity, edema, etc.): It was difficult to get a SpO2 reading.without several sensor changes.  sats on 14L at 55% FiO2 read 97-99%  HR in the 90's,  BP from 65-95/40-50's    Exercises     Assessment/Plan    PT Assessment Patient needs continued PT services  PT Problem List Decreased strength;Decreased activity tolerance;Decreased balance;Decreased mobility;Cardiopulmonary status limiting activity;Decreased skin integrity       PT Treatment Interventions Functional mobility training;Therapeutic activities;Balance training;Patient/family education    PT Goals (Current goals can be found in the Care Plan section)  Acute Rehab PT  Goals PT Goal Formulation: Patient unable to participate in goal setting Time For Goal Achievement: 11/04/20 Potential to Achieve Goals: Fair    Frequency Min 2X/week   Barriers to discharge        Co-evaluation               AM-PAC PT "6 Clicks" Mobility  Outcome Measure Help needed turning from your back to your side while in a flat bed without using bedrails?: A Lot Help needed moving from lying on your back to sitting on the side of a flat bed without using bedrails?: Total Help needed moving to and from a bed to a chair (including a wheelchair)?: Total Help needed standing up from a chair using your arms (e.g., wheelchair or bedside chair)?: Total Help needed to walk in hospital room?: Total Help needed climbing 3-5 steps with a railing? : Total 6 Click Score: 7    End of Session Equipment Utilized During Treatment: Oxygen Activity Tolerance: Patient limited by fatigue;Patient tolerated treatment well Patient left: in bed;with call bell/phone within reach;Other (comment);with nursing/sitter in room (upright in bed at 60*) Nurse Communication: Mobility status;Need for lift equipment PT Visit Diagnosis: Other abnormalities of gait and mobility (R26.89);Muscle weakness (generalized) (M62.81)    Time: 7494-4967 PT Time Calculation (min) (ACUTE ONLY): 42 min   Charges:   PT Evaluation $PT Eval Moderate Complexity: 1 Mod PT Treatments $Therapeutic Activity: 23-37 mins        10/21/2020  Ginger Carne., PT Acute Rehabilitation Services [phone]  (pager) [phone]  (office)  [name] 10/21/2020, 4:52 PM

## 2020-10-21 NOTE — Progress Notes (Signed)
OT Cancellation Note  Patient Details Name: Heather Jimenez MRN: [id number] DOB: 11-Jul-1945   Cancelled Treatment:    Reason Eval/Treat Not Completed: Patient not medically ready (Pt is intubated and sedated.)  Malka So 10/21/2020, 8:09 AM Nestor Lewandowsky, OTR/L Acute Rehabilitation Services Pager: [phone] Office: [phone]

## 2020-10-21 NOTE — Progress Notes (Addendum)
  Progress Note    10/21/2020 7:01 AM 1 Day Post-Op  Subjective:  intubated/sedated  Afebrile HR 09'G-283'M systolic 62'H-476'L systolic 465% .70 FiO2  Gtts: Norepi Dexmedetomidine  Fentanyl  Vitals:   10/21/20 0615 10/21/20 0630  BP:  120/81  Pulse:    Resp: 18 18  Temp: 98.78 F (37.1 C) 98.78 F (37.1 C)  SpO2: 100% 100%    Physical Exam: Cardiac:  regular Lungs:  intubated on .70 FiO2 Incisions:  left clavicular incision is clean and dry; left groin bandaged with mild bloody drainage.  Left AKA bandage is clean.  Abdomen:  soft, NT  CBC    Component Value Date/Time   WBC 20.7 (H) 10/21/2020 0330   RBC 4.12 10/21/2020 0330   HGB 12.3 10/21/2020 0330   HCT 36.7 10/21/2020 0330   PLT 308 10/21/2020 0330   MCV 89.1 10/21/2020 0330   MCH 29.9 10/21/2020 0330   MCHC 33.5 10/21/2020 0330   RDW 14.6 10/21/2020 0330   LYMPHSABS 1.8 11/06/2020 1832   MONOABS 0.8 10/16/2020 1832   EOSABS 0.0 10/28/2020 1832   BASOSABS 0.1 10/14/2020 1832    BMET    Component Value Date/Time   NA 132 (L) 10/21/2020 0330   K 4.2 10/21/2020 0330   CL 107 10/21/2020 0330   CO2 16 (L) 10/21/2020 0330   GLUCOSE 277 (H) 10/21/2020 0330   BUN 9 10/21/2020 0330   CREATININE 0.74 10/21/2020 0330   CALCIUM 7.3 (L) 10/21/2020 0330   GFRNONAA >60 10/21/2020 0330   GFRAA >60 12/31/2015 0820    INR    Component Value Date/Time   INR 1.4 (H) 10/28/2020 1832     Intake/Output Summary (Last 24 hours) at 10/21/2020 0701 Last data filed at 10/21/2020 0600 Gross per 24 hour  Intake 3390.69 ml  Output 900 ml  Net 2490.69 ml     Assessment/Plan:  75 y.o. female is s/p:  Left axillary to profunda femoris bypass with 66mm PTFE and left AKA  1 Day Post-Op   -pt's incisions are clean; bandage on left groin with mild bloody drainage.  Left AKA site is clean and dry. -requiring levophed for support-wean as tolerated.   -pt intubated and sedated-wean per CCM.  Some swelling around the  neck; no crepitus appreciated.  -acute blood loss anemia-received one unit PRBC's intraop.  Hgb stable -leukocytosis-cxr reveals bilateral pleural effusions and pulmonary edema-may need gentle diuresis.   Will defer to CCM.  Will cut back on IVF.  -hyperglycemia-SSI ordered.  -as far as po meds PTA, takes lasix prn-can convert this to IV.  Ok to hold other meds for now if needed.  -DVT prophylaxis:  sq heparin   Leontine Locket, PA-C [facility] [phone] 10/21/2020 7:01 AM  VASCULAR STAFF ADDENDUM: I have independently interviewed and examined the patient. I agree with the above.  Extubated and much improved after resuscitation last night. Continue supportive care as able.  Hopefully will be ready for 4E tomorrow.  Yevonne Aline. Stanford Breed, MD [facility] Phone Number: [phone] 10/21/2020 12:46 PM

## 2020-10-21 NOTE — Procedures (Signed)
Extubation Procedure Note  Patient Details:   Name: Heather Jimenez DOB: May 16, 1945 MRN: [id number]   Airway Documentation:    Vent end date: 10/21/20 Vent end time: 1005   Evaluation  O2 sats: stable throughout Complications: No apparent complications Patient did tolerate procedure well. Bilateral Breath Sounds: Diminished   Yes  Patient was extubated to a 55% FIO2 / 14L venti mask set up with trach collar without any complications, dyspnea or stridor noted.   [name] L 10/21/2020, 10:05 AM

## 2020-10-21 NOTE — H&P (Signed)
See consult note dated 8/7 for full H&P details.  Heather Jimenez. Stanford Breed, MD [facility] Phone Number: [phone] 10/21/2020 7:36 AM

## 2020-10-22 ENCOUNTER — Inpatient Hospital Stay (HOSPITAL_COMMUNITY): Payer: Medicare PPO

## 2020-10-22 ENCOUNTER — Encounter (HOSPITAL_COMMUNITY): Payer: Self-pay

## 2020-10-22 DIAGNOSIS — Z66 Do not resuscitate: Secondary | ICD-10-CM

## 2020-10-22 DIAGNOSIS — J9621 Acute and chronic respiratory failure with hypoxia: Secondary | ICD-10-CM

## 2020-10-22 DIAGNOSIS — I998 Other disorder of circulatory system: Secondary | ICD-10-CM

## 2020-10-22 DIAGNOSIS — R578 Other shock: Secondary | ICD-10-CM | POA: Diagnosis not present

## 2020-10-22 DIAGNOSIS — Z7189 Other specified counseling: Secondary | ICD-10-CM

## 2020-10-22 DIAGNOSIS — Z515 Encounter for palliative care: Secondary | ICD-10-CM

## 2020-10-22 LAB — ECHOCARDIOGRAM LIMITED
Area-P 1/2: 4.86 cm2
Calc EF: 44.9 %
Height: 64 in
S' Lateral: 2.8 cm
Single Plane A2C EF: 38 %
Single Plane A4C EF: 52.3 %
Weight: 1326.29 oz

## 2020-10-22 LAB — BODY FLUID CELL COUNT WITH DIFFERENTIAL
Eos, Fluid: 0 %
Lymphs, Fluid: 8 %
Monocyte-Macrophage-Serous Fluid: 9 % — ABNORMAL LOW (ref 50–90)
Neutrophil Count, Fluid: 83 % — ABNORMAL HIGH (ref 0–25)
Other Cells, Fluid: 1 %
Total Nucleated Cell Count, Fluid: 1429 cu mm — ABNORMAL HIGH (ref 0–1000)

## 2020-10-22 LAB — PROTEIN, PLEURAL OR PERITONEAL FLUID: Total protein, fluid: <3 g/dL

## 2020-10-22 LAB — CBC WITH DIFFERENTIAL/PLATELET
Abs Immature Granulocytes: 0.06 10*3/uL (ref 0.00–0.07)
Basophils Absolute: 0 10*3/uL (ref 0.0–0.1)
Basophils Relative: 0 %
Eosinophils Absolute: 0 10*3/uL (ref 0.0–0.5)
Eosinophils Relative: 0 %
HCT: 29.7 % — ABNORMAL LOW (ref 36.0–46.0)
Hemoglobin: 10 g/dL — ABNORMAL LOW (ref 12.0–15.0)
Immature Granulocytes: 1 %
Lymphocytes Relative: 5 %
Lymphs Abs: 0.6 10*3/uL — ABNORMAL LOW (ref 0.7–4.0)
MCH: 29.7 pg (ref 26.0–34.0)
MCHC: 33.7 g/dL (ref 30.0–36.0)
MCV: 88.1 fL (ref 80.0–100.0)
Monocytes Absolute: 0.6 10*3/uL (ref 0.1–1.0)
Monocytes Relative: 5 %
Neutro Abs: 11.1 10*3/uL — ABNORMAL HIGH (ref 1.7–7.7)
Neutrophils Relative %: 89 %
Platelets: 249 10*3/uL (ref 150–400)
RBC: 3.37 MIL/uL — ABNORMAL LOW (ref 3.87–5.11)
RDW: 14.8 % (ref 11.5–15.5)
WBC: 12.4 10*3/uL — ABNORMAL HIGH (ref 4.0–10.5)
nRBC: 0.2 % (ref 0.0–0.2)

## 2020-10-22 LAB — HEPATIC FUNCTION PANEL
ALT: 27 U/L (ref 0–44)
AST: 266 U/L — ABNORMAL HIGH (ref 15–41)
Albumin: 3.5 g/dL (ref 3.5–5.0)
Alkaline Phosphatase: 49 U/L (ref 38–126)
Bilirubin, Direct: 0.2 mg/dL (ref 0.0–0.2)
Indirect Bilirubin: 0.4 mg/dL (ref 0.3–0.9)
Total Bilirubin: 0.6 mg/dL (ref 0.3–1.2)
Total Protein: 4.8 g/dL — ABNORMAL LOW (ref 6.5–8.1)

## 2020-10-22 LAB — BASIC METABOLIC PANEL
Anion gap: 9 (ref 5–15)
BUN: 13 mg/dL (ref 8–23)
CO2: 23 mmol/L (ref 22–32)
Calcium: 6.8 mg/dL — ABNORMAL LOW (ref 8.9–10.3)
Chloride: 104 mmol/L (ref 98–111)
Creatinine, Ser: 0.7 mg/dL (ref 0.44–1.00)
GFR, Estimated: >60 mL/min (ref >60)
Glucose, Bld: 119 mg/dL — ABNORMAL HIGH (ref 70–99)
Potassium: 3.4 mmol/L — ABNORMAL LOW (ref 3.5–5.1)
Sodium: 136 mmol/L (ref 135–145)

## 2020-10-22 LAB — GLUCOSE, PLEURAL OR PERITONEAL FLUID: Glucose, Fluid: 125 mg/dL

## 2020-10-22 LAB — PHOSPHORUS: Phosphorus: 3.2 mg/dL (ref 2.5–4.6)

## 2020-10-22 LAB — PROCALCITONIN: Procalcitonin: 1.17 ng/mL

## 2020-10-22 LAB — D-DIMER, QUANTITATIVE: D-Dimer, Quant: 3.82 ug/mL-FEU — ABNORMAL HIGH (ref 0.00–0.50)

## 2020-10-22 LAB — LIPID PANEL
Cholesterol: 54 mg/dL (ref 0–200)
HDL: 16 mg/dL — ABNORMAL LOW (ref >40)
LDL Cholesterol: 26 mg/dL (ref 0–99)
Total CHOL/HDL Ratio: 3.4 RATIO
Triglycerides: 61 mg/dL (ref <150)
VLDL: 12 mg/dL (ref 0–40)

## 2020-10-22 LAB — GLUCOSE, CAPILLARY
Glucose-Capillary: 125 mg/dL — ABNORMAL HIGH (ref 70–99)
Glucose-Capillary: 70 mg/dL (ref 70–99)
Glucose-Capillary: 69 mg/dL — ABNORMAL LOW (ref 70–99)
Glucose-Capillary: 140 mg/dL — ABNORMAL HIGH (ref 70–99)

## 2020-10-22 LAB — FERRITIN: Ferritin: 2164 ng/mL — ABNORMAL HIGH (ref 11–307)

## 2020-10-22 LAB — LACTATE DEHYDROGENASE, PLEURAL OR PERITONEAL FLUID: LD, Fluid: 143 U/L — ABNORMAL HIGH (ref 3–23)

## 2020-10-22 LAB — C-REACTIVE PROTEIN: CRP: 8.1 mg/dL — ABNORMAL HIGH (ref <1.0)

## 2020-10-22 LAB — LACTATE DEHYDROGENASE: LDH: 648 U/L — ABNORMAL HIGH (ref 98–192)

## 2020-10-22 LAB — MAGNESIUM: Magnesium: 2.1 mg/dL (ref 1.7–2.4)

## 2020-10-22 MED ORDER — POTASSIUM CHLORIDE 10 MEQ/50ML IV SOLN
10.0000 meq | INTRAVENOUS | Status: AC
  Administered 2020-10-22 (×4): 10 meq via INTRAVENOUS
  Filled 2020-10-22 (×4): qty 50

## 2020-10-22 MED ORDER — DEXTROSE 10 % IV SOLN: INTRAVENOUS | Status: DC

## 2020-10-22 MED ORDER — PERFLUTREN LIPID MICROSPHERE
1.0000 mL | INTRAVENOUS | Status: AC | PRN
  Administered 2020-10-22: 2 mL via INTRAVENOUS
  Filled 2020-10-22: qty 10

## 2020-10-22 MED ORDER — PIPERACILLIN-TAZOBACTAM 3.375 G IVPB
3.3750 g | Freq: Three times a day (TID) | INTRAVENOUS | Status: DC
  Administered 2020-10-22 – 2020-10-23 (×3): 3.375 g via INTRAVENOUS
  Filled 2020-10-22 (×3): qty 50

## 2020-10-22 MED ORDER — DEXTROSE 50 % IV SOLN
25.0000 mL | Freq: Once | INTRAVENOUS | Status: AC
  Administered 2020-10-22: 25 mL via INTRAVENOUS

## 2020-10-22 MED ORDER — CALCIUM GLUCONATE-NACL 1-0.675 GM/50ML-% IV SOLN
1.0000 g | Freq: Once | INTRAVENOUS | Status: AC
  Administered 2020-10-22: 1000 mg via INTRAVENOUS
  Filled 2020-10-22: qty 50

## 2020-10-22 MED ORDER — DEXTROSE 50 % IV SOLN
INTRAVENOUS | Status: AC
  Filled 2020-10-22: qty 50

## 2020-10-22 MED ORDER — FUROSEMIDE 10 MG/ML IJ SOLN
40.0000 mg | Freq: Four times a day (QID) | INTRAMUSCULAR | Status: AC
Start: 2020-10-22 — End: 2020-10-22
  Administered 2020-10-22 (×2): 40 mg via INTRAVENOUS
  Filled 2020-10-22 (×3): qty 4

## 2020-10-22 NOTE — Progress Notes (Deleted)
  Echocardiogram 2D Echocardiogram has been performed.  Merrie Roof F 10/22/2020, 11:12 AM

## 2020-10-22 NOTE — Progress Notes (Signed)
[place] Progress Note Patient Name: Heather Jimenez DOB: 09/24/1945 MRN: [id number]   Date of Service  10/22/2020  HPI/Events of Note  Hypokalemia - K+ = 3.4 and Creatinine = 0.7   eICU Interventions  Will replace K+.     Intervention Category Major Interventions: Electrolyte abnormality - evaluation and management  Artrell Lawless Eugene 10/22/2020, 6:15 AM

## 2020-10-22 NOTE — Procedures (Signed)
Thoracentesis  Procedure Note  Heather Jimenez  [id number]  03-07-1946  Date:10/22/20  Time:10:24 AM   Provider Performing:Norah Devin   Procedure: Thoracentesis with imaging guidance (76546)  Indication(s) Pleural Effusion  Consent Risks of the procedure as well as the alternatives and risks of each were explained to the patient and/or caregiver.  Consent for the procedure was obtained and is signed in the bedside chart  Anesthesia Topical only with 1% lidocaine    Time Out Verified patient identification, verified procedure, site/side was marked, verified correct patient position, special equipment/implants available, medications/allergies/relevant history reviewed, required imaging and test results available.   Sterile Technique Maximal sterile technique including full sterile barrier drape, hand hygiene, sterile gown, sterile gloves, mask, hair covering, sterile ultrasound probe cover (if used).  Procedure Description Ultrasound was used to identify appropriate pleural anatomy for placement and overlying skin marked.  Area of drainage cleaned and draped in sterile fashion. Lidocaine was used to anesthetize the skin and subcutaneous tissue.  500 cc's of clear yellow appearing fluid was drained from the left pleural space. Catheter was then removed and bandaid applied to site.   Complications/Tolerance None; patient tolerated the procedure well. Chest X-ray is ordered to confirm no post-procedural complication.   EBL Minimal   Specimen(s) Pleural fluid  Mitzi Hansen, MD Internal Medicine Resident PGY-3 [facility] Pager: [phone] 10/22/2020 10:26 AM

## 2020-10-22 NOTE — Progress Notes (Signed)
  Echocardiogram 2D Echocardiogram with contrast has been performed.  Merrie Roof F 10/22/2020, 11:12 AM

## 2020-10-22 NOTE — Consult Note (Signed)
Consultation Note Date: 10/22/2020   Patient Name: Heather Jimenez  DOB: Jul 07, 1945  MRN: [id number]  Age / Sex: 75 y.o., female  PCP: Cher Nakai, MD Referring Physician: Cherre Robins, MD  Reason for Consultation: Establishing goals of care  HPI/Patient Profile: 75 y.o. female  with past medical history of throat cancer s/p laryngectomy with chronic tracheostomy, PAD, and tobacco abuse admitted on 10/30/2020 as a transfer from [facility]. Reason for transfer was 3 week of left lower extremity pain. Vascular was consulted for thrombosis of left aortofemoral bypass and left aortofemoral bypass limb to profunda femoris bypass - non-salvageable LLE. Pt found to be Covid19 positive on 10/21/20.  Palliative Medicine Team consulted for goals of care. Patient and family face ongoing treatment option decisions, advanced directive decisions, and anticipatory care needs. As of today, patient remains a full code with no advanced care planning documented.  Primary Decision Maker NEXT OF KIN - Husband - Reynolds Bowl"   Code Status/Advance Care Planning: DNR - placed today, 10/22/20  Palliative Medicine Discussion and Counselling I have reviewed medical records including EPIC notes, labs and imaging, received report from bedside RN, assessed the patient and then spoke with the patient's husband Herbie Baltimore and son Audelia Acton over the phone  to discuss diagnosis prognosis, GOC, EOL wishes, disposition and options.  We discussed clinical course as well as wishes moving forward in regard to advanced directives. I introduced Palliative Medicine as specialized medical care for people living with serious illness. I conveyed that Palliative Medicine focuses on providing relief from the symptoms and stress of a serious illness. The goal is to improve quality of life for both the patient and the family.  We discussed a brief life review of  the patient. 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 She has daughters who still live in [place] and [place], her son, who lives locally and is very involved in her care. Her husband Herbie Baltimore is 75 years old and very proud of his good health.   We discussed patient's current illness and what it means in the larger context of patient's on-going co-morbidities.  The patient's husband verbalized that he has a sense that this is not going well for his wife and that he sense the end coming. We discussed natural disease trajectory. He endorsed that he saw a rapid decline over the last few weeks, especially after her more recent amputation.   Advance directives and concepts specific to code status were considered and discussed.The difference between aggressive medical intervention and comfort care was considered in light of the patient's goals of care. I attempted to elicit values and goals of care important to the patient. The patient's husband expressed that the patient would not want to live "like this" and that she would not want to pursue rescucitation or CPR should her health decline to cardiac arrest. He agreed to change the patient's code status to DNR and continue current interventions for now. Discussed potential benefit  vs limitation of ongoing ventilator support and encouraged family to have ongoing discussion regarding benefits of ongoing aggressive care.      Discussed with patient/family the importance of continued conversation with family and the medical providers regarding overall plan of care and treatment options, ensuring decisions are within the context of the patient's values and GOCs.    Questions and concerns were addressed with both Herbie Baltimore and Audelia Acton. The family was encouraged to call with questions or concerns.    A virtual visit via E-link with three of the patient's children is scheduled for this evening, 10/22/20, at 6:15pm. We  conclude our discussion in agreement of the DNR and also to allow the family time to find and reivew the patient's living will as well as discuss amongst themselves what the next best steps for their wife/mother will be.  PMT will continue to follow holistically. I will touch base via telephone with Herbie Baltimore and Audelia Acton again tomorrow.   Prognosis:   < 2 weeks  Discharge Planning: Anticipated Hospital Death  Primary Diagnoses: Present on Admission:  Aortobifemoral bypass graft thrombosis ([place])  Ischemic leg  No Known Allergies Review of Systems  Unable to perform ROS: Acuity of condition   Physical Exam Vitals and nursing note reviewed.  Constitutional:      Appearance: She is ill-appearing.  HENT:     Head:     Comments: Temporal wasting Neck:     Comments: Stoma with vent present Cardiovascular:     Rate and Rhythm: Normal rate and regular rhythm.  Skin:    Coloration: Skin is pale.  Neurological:     Comments: Non-purposeful movement with UEs    Vital Signs: BP 116/88   Pulse 72   Temp 97.6 F (36.4 C) (Axillary)   Resp (!) 24   Ht 5\' 4"  (1.626 m)   Wt 37.6 kg   SpO2 99%   BMI 14.23 kg/m  Pain Scale: CPOT   Pain Score: 3    SpO2: SpO2: 99 % O2 Device:SpO2: 99 % O2 Flow Rate: .O2 Flow Rate (L/min): 14 L/min  IO: Intake/output summary:  Intake/Output Summary (Last 24 hours) at 10/22/2020 1447 Last data filed at 10/22/2020 1228 Gross per 24 hour  Intake 2574.65 ml  Output 1980 ml  Net 594.65 ml    LBM: Last BM Date:  (PTA) Baseline Weight: Weight: 34 kg Most recent weight: Weight: 37.6 kg     Palliative Assessment/Data: 10%     I discussed this patient's plan of care with bedside RN   Thank you for this consult. Palliative medicine will continue to follow and assist as needed.   Time Total: 70 minutes Greater than 50%  of this time was spent counseling and coordinating care related to the above assessment and plan.   Please contact Palliative  Medicine Team phone at [phone] for questions and concerns.  For individual provider: See Malachi Paradise, NP Palliative Medicine Team Pager [phone] (Please see [email or web address] for schedule) Team Phone [phone]    Greater than 50%  of this time was spent counseling and coordinating care related to the above assessment and plan

## 2020-10-22 NOTE — Progress Notes (Addendum)
  Progress Note    10/22/2020 8:03 AM 2 Days Post-Op  Subjective:  Re-intubated yesterday evening   Vitals:   10/22/20 0700 10/22/20 0740  BP: (!) 135/95 (!) 135/97  Pulse:  91  Resp: 18 18  Temp:    SpO2: 94% 93%   Physical Exam Lungs:  mechanical ventilation Incisions:  L chest incision c/d/I; L groin incision c/d/I without hematoma; L AKA incision with mottling Extremities:  palpable L axillary pulse; no palpable L radial but hand is warm; palpable graft pulse near axillary anastomosis Neurologic: sedated  CBC    Component Value Date/Time   WBC 20.7 (H) 10/21/2020 0330   RBC 4.12 10/21/2020 0330   HGB 10.5 (L) 10/21/2020 1727   HCT 31.0 (L) 10/21/2020 1727   PLT 308 10/21/2020 0330   MCV 89.1 10/21/2020 0330   MCH 29.9 10/21/2020 0330   MCHC 33.5 10/21/2020 0330   RDW 14.6 10/21/2020 0330   LYMPHSABS 1.8 11/13/2020 1832   MONOABS 0.8 11/03/2020 1832   EOSABS 0.0 11/09/2020 1832   BASOSABS 0.1 10/26/2020 1832    BMET    Component Value Date/Time   NA 136 10/22/2020 0443   K 3.4 (L) 10/22/2020 0443   CL 104 10/22/2020 0443   CO2 23 10/22/2020 0443   GLUCOSE 119 (H) 10/22/2020 0443   BUN 13 10/22/2020 0443   CREATININE 0.70 10/22/2020 0443   CALCIUM 6.8 (L) 10/22/2020 0443   GFRNONAA >60 10/22/2020 0443   GFRAA >60 12/31/2015 0820    INR    Component Value Date/Time   INR 1.4 (H) 10/28/2020 1832     Intake/Output Summary (Last 24 hours) at 10/22/2020 0803 Last data filed at 10/22/2020 0700 Gross per 24 hour  Intake 2229.36 ml  Output 1630 ml  Net 599.36 ml     Assessment/Plan:  75 y.o. female is s/p L ax-femoral bypass with L AKA 2 Days Post-Op   Overall poor clinical picture.  She required re-intubation yesterday evening.  She is still requiring pressor support.  As much as I can tell her ax-fem bypass is patent however her L AKA is mottled.  Appreciate assistance from CCM.  Patient and family may benefit from palliative consult to discuss goals  of care.  We will continue supportive care.   Dagoberto Ligas, PA-C [facility] [phone] 10/22/2020 8:03 AM  VASCULAR STAFF ADDENDUM: I have independently interviewed and examined the patient. I agree with the above.  Rapid deterioration yesterday. Her left AKA stump is mottled.  Her bypass appears patent by clinical exam. I explained the gravity of the problem to her husband. No further options from a vascular standpoint. Recommend palliative care evaluation for goals of care discussion.   Yevonne Aline. Stanford Breed, MD [facility] Phone Number: [phone] 10/22/2020 1:33 PM

## 2020-10-22 NOTE — Progress Notes (Signed)
OT Cancellation Note  Patient Details Name: Heather Jimenez MRN: [id number] DOB: 01-01-1946   Cancelled Treatment:    Reason Eval/Treat Not Completed: Medical issues which prohibited therapy- pt re-intubated over night, sedated.  Not appropriate for OT at this time.  Will follow and see as able.  Jolaine Artist, OT Acute Rehabilitation Services Pager [phone] Office [phone]   Delight Stare 10/22/2020, 11:35 AM

## 2020-10-22 NOTE — Progress Notes (Signed)
This RN changed bandage to surgical site at the end of the remaining left leg for the second time during shift. Increased mottling noted, black color to inner thigh with sloughing of skin noted, small area of about 1 cm X1 cm to anterior aspect of distal end of remaining thigh noted. Incision to upper thigh noted to have increased mottling as well. Dr. Stanford Breed paged and immediately returned call. Informed of changes to surgical site. Dr. Stanford Breed to come assess pt. No new orders at this time. Dr. Darrick Meigs at bedside for inspection of site, dressing change, and call with Dr. Stanford Breed.

## 2020-10-22 NOTE — Progress Notes (Addendum)
NAME:  Heather Jimenez, MRN:  [id number], DOB:  1945-07-28, LOS: 2 ADMISSION DATE:  11/04/2020, CONSULTATION DATE:  11/08/2020 REFERRING MD:  Stanford Breed, CHIEF COMPLAINT:  critical limb ischemia   Brief History   Heather Jimenez is a 47 yom with  throat cancer s/p laryngectomy, peripheral arterial disease, tobacco use who was transferred from [facility] for 3 weeks of left lower extremity pain. She has been admitted by Vascular Surgery for thrombosis of left aortofemoral bypass and left aortofemoral bypass limb to profunda femoris bypass causing non-salvageable left lower extremity. She is status post left axillary - profunda femoris bypass and left above the knee amputation.   PCCM has been consuted for post-operative ventilator management and for hypotension as she is being admitted to the ICU.  Past Medical History  Peripheral arterial disease Throat Cancer Chronic tracheostomy Tobacco Use Significant Hospital Events   8/7--profunda femoris bypass and left above the knee amputation 8/7--intubated 8/8--extubated, reintubated  8/9--thoracentesis (Left)  Consults:  PCCM Palliative 8/9 Micro Data:  MRSA PCR-negative COVID 19 +  Antimicrobials:  Cefazolin 8/8>>   Interim history/subjective:  Reintubated yesterday afternoon due to hypoxia  Remains vent dependent this morning. Left pleural effusion present on bedside ultrasound.   Objective   Blood pressure 115/83, pulse 94, temperature 97.6 F (36.4 C), temperature source Axillary, resp. rate 18, height 5\' 4"  (1.626 m), weight 37.6 kg, SpO2 95 %.    Vent Mode: PRVC FiO2 (%):  [50 %-100 %] 80 % Set Rate:  [18 bmp] 18 bmp Vt Set:  [430 mL] 430 mL PEEP:  [5 cmH20] 5 cmH20 Plateau Pressure:  [20 cmH20-26 cmH20] 23 cmH20   Intake/Output Summary (Last 24 hours) at 10/22/2020 0709 Last data filed at 10/22/2020 0500 Gross per 24 hour  Intake 3387.28 ml  Output 1630 ml  Net 1757.28 ml    Filed Weights   10/17/2020 1145 10/21/20 0500  10/22/20 0500  Weight: 34 kg 35 kg 37.6 kg    Examination: General: critically ill appearing HENT: laryngically intubated, dry MM Cardiac: RRR Pulm: on PRVC, diminished lung sounds L>R GI: soft, non-tender Neuro: awakens to voice, follows commands  Assessment & Plan:   Acute hypoxemic, hypercapnic respiratory failure requiring mechanical ventilation, Moderate ARDS, Large left pleural effusion Likely multifactorial including COVID 19, bilateral pleural effusion Failed extubation 8/8 requiring reintubation. P/F ratio ~160 Left thoracentesis performed this morning with 500cc removed. Fluid sent  Chronic laryngectomy stoma COVID 19 +.  T cycle 27 suggesting active infection Plan -ARDS vent settings -decadron, redesivir, baracitanib daily -trend inflammatory markers -awaiting echocardiogram -follow up pleural fluid labs -VAP bundle -continue lasix -prn precedex and fentaynl for RAS goal -2 to -3  Hyperglycemia -check A1C -CBGs q4h -SSI  Critical limb ischemia s/p left axillary-profunda femoris bypass and left AKA -management per vascular surgery  Post op Shock. Improving.  -continue weaning levophed for MAP goal >65  Goals of Care -palliative consult   Unstageable sacral ulcer -continue wound care and frequent repositioning  Best practice:  Diet: mechanical soft Pain/Anxiety/Delirium protocol (if indicated): precedex, fentanyl VAP protocol (if indicated): yes DVT prophylaxis: heparin subcut GI prophylaxis: protonix Glucose control: SSI Mobility: BR Code Status: Full Family Communication: per primary Disposition: ICU  Labs   CBC: Recent Labs  Lab 11/04/2020 1832 10/17/2020 2037 10/18/2020 2259 10/21/20 0316 10/21/20 0330 10/21/20 1727  WBC 18.0*  --   --   --  20.7*  --   NEUTROABS 14.9*  --   --   --   --   --  HGB 12.0 12.2 10.2* 12.2 12.3 10.5*  HCT 38.2 36.0 30.0* 36.0 36.7 31.0*  MCV 93.6  --   --   --  89.1  --   PLT 320  --   --   --  308  --       Basic Metabolic Panel: Recent Labs  Lab 10/19/2020 1627 10/27/2020 1738 11/05/2020 1832 11/08/2020 2037 10/17/2020 2259 10/21/20 0316 10/21/20 0330 10/21/20 1727 10/22/20 0443  NA 135   < > 134*   < > 137 136 132* 138 136  K 4.2   < > 5.1   < > 4.1 4.2 4.2 3.8 3.4*  CL 105  --  106  --   --   --  107  --  104  CO2  --   --  18*  --   --   --  16*  --  23  GLUCOSE 124*  --  318*  --   --   --  277*  --  119*  BUN 6*  --  8  --   --   --  9  --  13  CREATININE 0.30*  --  0.65  --   --   --  0.74  --  0.70  CALCIUM  --   --  8.6*  --   --   --  7.3*  --  6.8*  MG  --   --  1.6*  --   --   --  1.7  --  2.1  PHOS  --   --  6.8*  --   --   --  4.5  --  3.2   < > = values in this interval not displayed.    GFR: Estimated Creatinine Clearance: 36.1 mL/min (by C-G formula based on SCr of 0.7 mg/dL). Recent Labs  Lab 10/15/2020 1832 11/06/2020 1833 11/04/2020 2133 10/21/20 0020 10/21/20 0330  WBC 18.0*  --   --   --  20.7*  LATICACIDVEN  --  5.0* 4.4* 2.5*  --      Liver Function Tests: Recent Labs  Lab 10/19/2020 1832 10/22/20 0443  AST 76* 266*  ALT 22 27  ALKPHOS 71 49  BILITOT 1.1 0.6  PROT 4.4* 4.8*  ALBUMIN 2.3* 3.5    No results for input(s): LIPASE, AMYLASE in the last 168 hours. No results for input(s): AMMONIA in the last 168 hours.  ABG    Component Value Date/Time   PHART 7.258 (L) 10/21/2020 1727   PCO2ART 45.4 10/21/2020 1727   PO2ART 115 (H) 10/21/2020 1727   HCO3 20.4 10/21/2020 1727   TCO2 22 10/21/2020 1727   ACIDBASEDEF 7.0 (H) 10/21/2020 1727   O2SAT 98.0 10/21/2020 1727      Coagulation Profile: Recent Labs  Lab 11/10/2020 1832  INR 1.4*     Cardiac Enzymes: No results for input(s): CKTOTAL, CKMB, CKMBINDEX, TROPONINI in the last 168 hours.  HbA1C: Hgb A1c MFr Bld  Date/Time Value Ref Range Status  10/21/2020 03:30 AM 6.3 (H) 4.8 - 5.6 % Final    Comment:    (NOTE) Pre diabetes:          5.7%-6.4%  Diabetes:               >6.4%  Glycemic control for   <7.0% adults with diabetes   08/14/2020 06:44 PM 5.5 4.8 - 5.6 % Final    Comment:    (NOTE)  Prediabetes: 5.7 - 6.4         Diabetes: >6.4         Glycemic control for adults with diabetes: <7.0     CBG: Recent Labs  Lab 10/21/20 0752 10/21/20 1133 10/21/20 1640 10/21/20 2152  Parker, MD Internal Medicine Resident PGY-3 [facility] Pager: [phone] 10/22/2020 7:09 AM     Attending Addendum 10/22/2020     I have seen and evaluated the patient for respiratory failure.   S:  Worsening hypoxemia requiring reintubation yesterday afternoon.   Today is either awake and anxious or passed out from pain meds.   O: Blood pressure 115/83, pulse 94, temperature 97.6 F (36.4 C), temperature source Axillary, resp. rate 18, height 5\' 4"  (1.626 m), weight 37.6 kg, SpO2 95 %.  Frail thin elderly woman on vent Diminished breath sounds on left, moderate free flowing effusion w/ [place] +temporal wasting Bilateral AKA, L stump dressed without strikethrough   K low, replaced Still positive?   A:  Ongoing hypoxemic respiratory failure due to some combination interstitial edema, COVID pneumonia Hx head/neck cancer post laryngectomy Pressure ulcer POA Protein calorie malnutrition, severe Chronic pain and anxiety Severe PAD with thrombosis of left aortofemoral bypass w/ nonsalvagable LLE s/p L AKA and salvage axillary profunda femoris bypass   P:  - Push diuresis - Diagnostic/therapeutic tap on left effusion - Steroids, remdesivir, baricitinib - Continue vent support, increase MV - Post op wound care per vascular - PT/OT as able - Fine to eat as her GI tract is separate from her resp tract - Palliative consult is reasonable   Patient critically ill due to respiratory failure Interventions to address this today vent titration Risk of deterioration without these  interventions is high   I personally spent 34 minutes providing critical care not including any separately billable procedures   Erskine Emery MD [facility] Prefer epic messenger for cross cover needs If after hours, please call E-link

## 2020-10-23 ENCOUNTER — Inpatient Hospital Stay (HOSPITAL_COMMUNITY): Payer: Medicare PPO

## 2020-10-23 ENCOUNTER — Other Ambulatory Visit (HOSPITAL_COMMUNITY): Payer: Medicare PPO

## 2020-10-23 DIAGNOSIS — I7090 Unspecified atherosclerosis: Secondary | ICD-10-CM | POA: Diagnosis not present

## 2020-10-23 DIAGNOSIS — J80 Acute respiratory distress syndrome: Secondary | ICD-10-CM | POA: Diagnosis not present

## 2020-10-23 LAB — TYPE AND SCREEN
ABO/RH(D): AB NEG
Antibody Screen: POSITIVE
Donor AG Type: NEGATIVE
Donor AG Type: NEGATIVE
Donor AG Type: NEGATIVE
Donor AG Type: NEGATIVE
Unit division: 0
Unit division: 0
Unit division: 0
Unit division: 0

## 2020-10-23 LAB — COMPREHENSIVE METABOLIC PANEL
ALT: 18 U/L (ref 0–44)
AST: 522 U/L — ABNORMAL HIGH (ref 15–41)
Albumin: 3 g/dL — ABNORMAL LOW (ref 3.5–5.0)
Alkaline Phosphatase: 65 U/L (ref 38–126)
Anion gap: 15 (ref 5–15)
BUN: 21 mg/dL (ref 8–23)
CO2: 22 mmol/L (ref 22–32)
Calcium: 7.2 mg/dL — ABNORMAL LOW (ref 8.9–10.3)
Chloride: 98 mmol/L (ref 98–111)
Creatinine, Ser: 1.25 mg/dL — ABNORMAL HIGH (ref 0.44–1.00)
GFR, Estimated: 45 mL/min — ABNORMAL LOW (ref >60)
Glucose, Bld: 228 mg/dL — ABNORMAL HIGH (ref 70–99)
Potassium: 3.6 mmol/L (ref 3.5–5.1)
Sodium: 135 mmol/L (ref 135–145)
Total Bilirubin: 1.1 mg/dL (ref 0.3–1.2)
Total Protein: 4.8 g/dL — ABNORMAL LOW (ref 6.5–8.1)

## 2020-10-23 LAB — POCT I-STAT EG7
Acid-Base Excess: 3 mmol/L — ABNORMAL HIGH (ref 0.0–2.0)
Bicarbonate: 25.1 mmol/L (ref 20.0–28.0)
Calcium, Ion: 0.95 mmol/L — ABNORMAL LOW (ref 1.15–1.40)
HCT: 36 % (ref 36.0–46.0)
Hemoglobin: 12.2 g/dL (ref 12.0–15.0)
O2 Saturation: 49 %
Potassium: 3.3 mmol/L — ABNORMAL LOW (ref 3.5–5.1)
Sodium: 136 mmol/L (ref 135–145)
TCO2: 26 mmol/L (ref 22–32)
pCO2, Ven: 31 mmHg — ABNORMAL LOW (ref 44.0–60.0)
pH, Ven: 7.516 — ABNORMAL HIGH (ref 7.250–7.430)
pO2, Ven: 23 mmHg — CL (ref 32.0–45.0)

## 2020-10-23 LAB — CBC WITH DIFFERENTIAL/PLATELET
Abs Immature Granulocytes: 0.13 10*3/uL — ABNORMAL HIGH (ref 0.00–0.07)
Basophils Absolute: 0 10*3/uL (ref 0.0–0.1)
Basophils Relative: 0 %
Eosinophils Absolute: 0 10*3/uL (ref 0.0–0.5)
Eosinophils Relative: 0 %
HCT: 32.7 % — ABNORMAL LOW (ref 36.0–46.0)
Hemoglobin: 11.1 g/dL — ABNORMAL LOW (ref 12.0–15.0)
Immature Granulocytes: 1 %
Lymphocytes Relative: 3 %
Lymphs Abs: 0.5 10*3/uL — ABNORMAL LOW (ref 0.7–4.0)
MCH: 29.2 pg (ref 26.0–34.0)
MCHC: 33.9 g/dL (ref 30.0–36.0)
MCV: 86.1 fL (ref 80.0–100.0)
Monocytes Absolute: 0.6 10*3/uL (ref 0.1–1.0)
Monocytes Relative: 4 %
Neutro Abs: 14.5 10*3/uL — ABNORMAL HIGH (ref 1.7–7.7)
Neutrophils Relative %: 92 %
Platelets: 255 10*3/uL (ref 150–400)
RBC: 3.8 MIL/uL — ABNORMAL LOW (ref 3.87–5.11)
RDW: 14.8 % (ref 11.5–15.5)
WBC: 15.8 10*3/uL — ABNORMAL HIGH (ref 4.0–10.5)
nRBC: 0.4 % — ABNORMAL HIGH (ref 0.0–0.2)

## 2020-10-23 LAB — C-REACTIVE PROTEIN: CRP: 14.9 mg/dL — ABNORMAL HIGH (ref <1.0)

## 2020-10-23 LAB — BPAM RBC
Blood Product Expiration Date: 202208142359
Blood Product Expiration Date: 202208242359
Blood Product Expiration Date: 202208242359
Blood Product Expiration Date: 202208262359
ISSUE DATE / TIME: 202207200918
ISSUE DATE / TIME: 202207201237
ISSUE DATE / TIME: 202208071652
Unit Type and Rh: 600
Unit Type and Rh: 600
Unit Type and Rh: 600
Unit Type and Rh: 9500

## 2020-10-23 LAB — FERRITIN: Ferritin: 1666 ng/mL — ABNORMAL HIGH (ref 11–307)

## 2020-10-23 LAB — SURGICAL PATHOLOGY

## 2020-10-23 LAB — D-DIMER, QUANTITATIVE: D-Dimer, Quant: 3.18 ug/mL-FEU — ABNORMAL HIGH (ref 0.00–0.50)

## 2020-10-23 LAB — GLUCOSE, CAPILLARY
Glucose-Capillary: 220 mg/dL — ABNORMAL HIGH (ref 70–99)
Glucose-Capillary: 269 mg/dL — ABNORMAL HIGH (ref 70–99)
Glucose-Capillary: 155 mg/dL — ABNORMAL HIGH (ref 70–99)

## 2020-10-23 LAB — CYTOLOGY - NON PAP

## 2020-10-23 LAB — MAGNESIUM: Magnesium: 1.7 mg/dL (ref 1.7–2.4)

## 2020-10-23 LAB — PHOSPHORUS: Phosphorus: 3.2 mg/dL (ref 2.5–4.6)

## 2020-10-23 MED ORDER — LORAZEPAM 2 MG/ML IJ SOLN
1.0000 mg | INTRAMUSCULAR | Status: DC | PRN
  Administered 2020-10-23: 1 mg via INTRAVENOUS
  Filled 2020-10-23 (×3): qty 1

## 2020-10-23 MED ORDER — BIOTENE DRY MOUTH MT LIQD: 15.0000 mL | OROMUCOSAL | Status: DC | PRN

## 2020-10-23 MED ORDER — HALOPERIDOL LACTATE 5 MG/ML IJ SOLN: 5.0000 mg | Freq: Four times a day (QID) | INTRAMUSCULAR | Status: DC | PRN

## 2020-10-23 MED ORDER — MIDAZOLAM HCL 2 MG/2ML IJ SOLN: 2.0000 mg | INTRAMUSCULAR | Status: DC | PRN

## 2020-10-23 MED ORDER — HYDROMORPHONE HCL 1 MG/ML IJ SOLN
2.0000 mg | INTRAMUSCULAR | Status: DC | PRN
  Administered 2020-10-23 (×3): 2 mg via INTRAVENOUS
  Filled 2020-10-23 (×3): qty 2

## 2020-10-23 MED ORDER — LORAZEPAM 2 MG/ML IJ SOLN: 0.5000 mg/h | INTRAVENOUS | Status: DC

## 2020-10-23 MED ORDER — MAGNESIUM SULFATE 2 GM/50ML IV SOLN
2.0000 g | Freq: Once | INTRAVENOUS | Status: AC
  Administered 2020-10-23: 2 g via INTRAVENOUS
  Filled 2020-10-23: qty 50

## 2020-10-23 MED ORDER — MIDAZOLAM-SODIUM CHLORIDE 100-0.9 MG/100ML-% IV SOLN
0.5000 mg/h | INTRAVENOUS | Status: DC
Start: 2020-10-23 — End: 2020-10-23
  Administered 2020-10-23: 0.5 mg/h via INTRAVENOUS
  Filled 2020-10-23: qty 100

## 2020-10-23 MED ORDER — POLYVINYL ALCOHOL 1.4 % OP SOLN
1.0000 [drp] | Freq: Four times a day (QID) | OPHTHALMIC | Status: DC | PRN
  Filled 2020-10-23: qty 15

## 2020-10-23 MED ORDER — HYDROMORPHONE BOLUS VIA INFUSION
2.0000 mg | INTRAVENOUS | Status: DC | PRN
  Filled 2020-10-23: qty 2

## 2020-10-23 MED ORDER — MIDAZOLAM BOLUS VIA INFUSION
2.0000 mg | INTRAVENOUS | Status: DC | PRN
  Filled 2020-10-23: qty 2

## 2020-10-23 MED ORDER — HYDROMORPHONE HCL PF 10 MG/ML IJ SOLN
2.0000 mg/h | INTRAMUSCULAR | Status: DC
Start: 2020-10-23 — End: 2020-10-23
  Filled 2020-10-23: qty 2.5

## 2020-10-23 MED ORDER — GLYCOPYRROLATE 0.2 MG/ML IJ SOLN: 0.4000 mg | Freq: Three times a day (TID) | INTRAMUSCULAR | Status: DC

## 2020-10-23 MED ORDER — LORAZEPAM BOLUS VIA INFUSION: 1.0000 mg | INTRAVENOUS | Status: DC | PRN

## 2020-10-23 MED ORDER — GLYCOPYRROLATE 0.2 MG/ML IJ SOLN: 0.4000 mg | INTRAMUSCULAR | Status: DC | PRN

## 2020-10-23 MED ORDER — POTASSIUM CHLORIDE 10 MEQ/100ML IV SOLN
10.0000 meq | INTRAVENOUS | Status: DC
  Administered 2020-10-23 (×3): 10 meq via INTRAVENOUS
  Filled 2020-10-23 (×3): qty 100

## 2020-10-23 MED ORDER — LORAZEPAM 2 MG/ML IJ SOLN
4.0000 mg | Freq: Once | INTRAMUSCULAR | Status: AC
  Administered 2020-10-23: 4 mg via INTRAVENOUS

## 2020-10-25 LAB — BODY FLUID CULTURE W GRAM STAIN: Culture: NO GROWTH

## 2020-11-14 NOTE — Plan of Care (Signed)
Attended the goals of care discussion led by the palliative care team. Kodi's husband, Reynolds Bowl", and her son, Audelia Acton, were present in person. Additional family joined the conversation via speaker phone for portions of the discussion.   I provided an update on the medical changes from yesterday. Discussed that her femoral-axillary graft is no longer functional this morning, leading to diminished flow to her left stump and the necrotic changes seen on exam. Explained that vascular surgery is unable to offer further intervention at this time. We discussed her ongoing dependence on life support, including ventilation and pressors. Discussed worsening kidney and liver changes.   Mikki Santee, pt's husband, and family members that were present in person or had joined via speaker phone were in agreement with a transition to comfort care, which is in line with her wishes expressed on her will which was provided today by Mikki Santee.   Will plan to transition discontinue all life support and invasive interventions upon family's return this afternoon. Will extubate to room air. Comfort orders placed by palliative care. Greatly appreciate their consultation.  Mitzi Hansen, MD Internal Medicine Resident PGY-3 [facility] Pager: [phone] Nov 20, 2020 1:55 PM

## 2020-11-14 NOTE — Progress Notes (Signed)
Nutrition Brief Note  Chart reviewed. NPO.  Pt now transitioning to comfort care.  No further nutrition interventions planned at this time.  Please re-consult as needed.   Kerman Passey MS, RDN, LDN, CNSC Registered Dietitian III Clinical Nutrition RD Pager and On-Call Pager Number Located in [place]

## 2020-11-14 NOTE — Progress Notes (Addendum)
NAME:  Heather Jimenez, MRN:  [id number], DOB:  Sep 23, 1945, LOS: 3 ADMISSION DATE:  10/15/2020, CONSULTATION DATE:  11/10/2020 REFERRING MD:  Stanford Breed, CHIEF COMPLAINT:  critical limb ischemia   Brief History   Heather Jimenez is a 67 yom with  throat cancer s/p laryngectomy, peripheral arterial disease, tobacco use who was transferred from [facility] for 3 weeks of left lower extremity pain. She has been admitted by Vascular Surgery for thrombosis of left aortofemoral bypass and left aortofemoral bypass limb to profunda femoris bypass causing non-salvageable left lower extremity. She is status post left axillary - profunda femoris bypass and left above the knee amputation.   PCCM has been consuted for post-operative ventilator management and for hypotension as she is being admitted to the ICU.  Past Medical History  Peripheral arterial disease Throat Cancer Chronic tracheostomy Tobacco Use Significant Hospital Events   8/7--profunda femoris bypass and left above the knee amputation 8/7--intubated 8/8--extubated, reintubated  8/9--thoracentesis (Left) 8/9--palliative consulted, transitioned to DNR 8/10--axillary-femoral bypass graft down  Consults:  PCCM Palliative 8/9 Micro Data:  MRSA PCR-negative COVID 19 +  Antimicrobials:  Cefazolin 8/8>>   Interim history/subjective:  Seen by palliative care yesterday and transitioned to DNR.  Paged yesterday afternoon to re-evaluate pt's stump. It appearing to have progression of ecchymosis extending superiorly, in addition to some new necrotic lesions overlying the distal left stump and groin.   No significant overnight events.   Objective   Blood pressure 95/67, pulse 78, temperature 98.2 F (36.8 C), temperature source Axillary, resp. rate (!) 31, height 5\' 4"  (1.626 m), weight 37.6 kg, SpO2 98 %.    Vent Mode: PRVC FiO2 (%):  [40 %-70 %] 40 % Set Rate:  [18 bmp-24 bmp] 24 bmp Vt Set:  [430 mL] 430 mL PEEP:  [5 cmH20] 5  cmH20 Plateau Pressure:  [17 cmH20-23 cmH20] 17 cmH20   Intake/Output Summary (Last 24 hours) at 10-27-20 0711 Last data filed at 10/27/2020 0600 Gross per 24 hour  Intake 780.64 ml  Output 3345 ml  Net -2564.36 ml    Filed Weights   11/04/2020 1145 10/21/20 0500 10/22/20 0500  Weight: 34 kg 35 kg 37.6 kg    Examination: General: critically ill appearing HENT: laryngically intubated, dry MM Cardiac: RRR. Unable to palpate or ausculate the left axillary-femoral graft pulse. No flow appreciable on doppler.  Pulm: on PRVC, mechanical breath sounds bilaterally GI: soft, non-tender Neuro: awakens to voice, follows commands Skin: progressive mottling of the left stump. Necrotic lesions involving the medial groin fold . Groin and axillary incisions appear clean and are without drainage. Some mottling present on the left trunk. Diffuse ecchymosis of the bilateral upper extremities.   Assessment & Plan:    Goals of Care, DNR (8/9) Overall clinical picture suggestive she is nearing end of life. Unfortunately, no further intervention can be offered  from a vascular standpoint. I called and spoke with her husband this morning who plans to be arriving with his son after 0800 so we can re-visit goals of care.   Critical limb ischemia s/p left axillary-profunda femoris bypass and left AKA Unfortunately, graft is down this morning. Discussed with Dr. Stanford Breed who does not feel there are any further interventions available.  -management per vascular surgery  Acute hypoxemic, hypercapnic respiratory failure with respiratory acidosis requiring mechanical ventilation via chronic laryngectomy stoma, Moderate ARDS, left exudative pleural effusion Failed extubation 8/8 requiring reintubation. P/F ratio ~160 S/p left thoracentesis 8/9 with 500cc removed.  Suspect exudative effusion in the setting of ARDS and malignancy. No growth on pleural fluid cultures. Chronic laryngectomy stoma COVID 19 +.   Plan -ARDS vent settings -d/c remdesivir for elevated AST, will also d/c baricitanib due to inability to take PO -continue decadron 6mg  IV daily -continue zosyn -trend inflammatory markers -follow up pleural fluid cultures -VAP bundle -prn precedex and fentaynl for RAS goal -2 to -3  AKI. Likely part of the dying process and being volume down. Unfortunately, she is also 3rd spacing, complicating volume repletion.  Hypocalcemia. Corrected calcium 7.2>>8 after receiving calcium gluconate yesterday.    Acute liver injury. AST 522. Likely multifactorial, including end of life and remdesivir related. ALT, bili WNL -d/c remdesivir -continue to monitor  Anemia of chronic disease. Chronic and stable. Continue to monitor.  HFmrEF. LV RWM abnormalities suggestive of ischemic disease.  Mild-moderate MR, severe TR -hold diuresis in the setting of AKI  Hyperglycemia Hypoglycemic yesterday>placed on d10. Glucose >200 this morning.  -decrease d10 to 56mL/hr -CBGs q4h -SSI  Post op Shock. Remains pressor dependent but slowly improving. -continue weaning levophed for MAP goal >65  Unstageable sacral ulcer -continue wound care and frequent repositioning  Best practice:  Diet: NPO Pain/Anxiety/Delirium protocol (if indicated): precedex, fentanyl VAP protocol (if indicated): yes DVT prophylaxis: heparin subcut GI prophylaxis: protonix Glucose control: SSI Mobility: BR Code Status: DNR Family Communication: per primary Disposition: ICU  Labs   CBC: Recent Labs  Lab 10/22/2020 1832 11/03/2020 2037 10/21/20 0316 10/21/20 0330 10/21/20 1727 10/22/20 0849 Nov 10, 2020 0450  WBC 18.0*  --   --  20.7*  --  12.4* 15.8*  NEUTROABS 14.9*  --   --   --   --  11.1* 14.5*  HGB 12.0   < > 12.2 12.3 10.5* 10.0* 11.1*  HCT 38.2   < > 36.0 36.7 31.0* 29.7* 32.7*  MCV 93.6  --   --  89.1  --  88.1 86.1  PLT 320  --   --  308  --  249 255   < > = values in this interval not displayed.      Basic Metabolic Panel: Recent Labs  Lab 11/06/2020 1627 10/29/2020 1738 10/28/2020 1832 10/15/2020 2037 10/21/20 0316 10/21/20 0330 10/21/20 1727 10/22/20 0443 2020/11/10 0450  NA 135   < > 134*   < > 136 132* 138 136 135  K 4.2   < > 5.1   < > 4.2 4.2 3.8 3.4* 3.6  CL 105  --  106  --   --  107  --  104 98  CO2  --   --  18*  --   --  16*  --  23 22  GLUCOSE 124*  --  318*  --   --  277*  --  119* 228*  BUN 6*  --  8  --   --  9  --  13 21  CREATININE 0.30*  --  0.65  --   --  0.74  --  0.70 1.25*  CALCIUM  --   --  8.6*  --   --  7.3*  --  6.8* 7.2*  MG  --   --  1.6*  --   --  1.7  --  2.1 1.7  PHOS  --   --  6.8*  --   --  4.5  --  3.2 3.2   < > = values in this interval not displayed.    GFR:  Estimated Creatinine Clearance: 23.1 mL/min (A) (by C-G formula based on SCr of 1.25 mg/dL (H)). Recent Labs  Lab 11/04/2020 1832 11/11/2020 1833 10/31/2020 2133 10/21/20 0020 10/21/20 0330 10/22/20 0849 November 13, 2020 0450  PROCALCITON  --   --   --   --   --  1.17  --   WBC 18.0*  --   --   --  20.7* 12.4* 15.8*  LATICACIDVEN  --  5.0* 4.4* 2.5*  --   --   --      Liver Function Tests: Recent Labs  Lab 10/19/2020 1832 10/22/20 0443 11/13/2020 0450  AST 76* 266* 522*  ALT 22 27 18   ALKPHOS 71 49 65  BILITOT 1.1 0.6 1.1  PROT 4.4* 4.8* 4.8*  ALBUMIN 2.3* 3.5 3.0*    No results for input(s): LIPASE, AMYLASE in the last 168 hours. No results for input(s): AMMONIA in the last 168 hours.  ABG    Component Value Date/Time   PHART 7.258 (L) 10/21/2020 1727   PCO2ART 45.4 10/21/2020 1727   PO2ART 115 (H) 10/21/2020 1727   HCO3 20.4 10/21/2020 1727   TCO2 22 10/21/2020 1727   ACIDBASEDEF 7.0 (H) 10/21/2020 1727   O2SAT 98.0 10/21/2020 1727      Coagulation Profile: Recent Labs  Lab 11/02/2020 1832  INR 1.4*     Cardiac Enzymes: No results for input(s): CKTOTAL, CKMB, CKMBINDEX, TROPONINI in the last 168 hours.  HbA1C: Hgb A1c MFr Bld  Date/Time Value Ref Range Status   10/21/2020 03:30 AM 6.3 (H) 4.8 - 5.6 % Final    Comment:    (NOTE) Pre diabetes:          5.7%-6.4%  Diabetes:              >6.4%  Glycemic control for   <7.0% adults with diabetes   08/14/2020 06:44 PM 5.5 4.8 - 5.6 % Final    Comment:    (NOTE)         Prediabetes: 5.7 - 6.4         Diabetes: >6.4         Glycemic control for adults with diabetes: <7.0     CBG: Recent Labs  Lab 10/22/20 0848 10/22/20 1136 10/22/20 1646 10/22/20 2125 11/13/20 G[place], MD Internal Medicine Resident PGY-3 [facility] Pager: [phone] 11-13-20 7:11 AM     Attending Addendum 2020/11/13 I have seen and evaluated the patient for postop respiratory failure.   S:  Not doing too well. New graft now suspected to have thrombosed Suspect having terminal delirium, periods of agitation and apparent discomfort.   O: Blood pressure 95/67, pulse 78, temperature 98.2 F (36.8 C), temperature source Axillary, resp. rate (!) 31, height 5\' 4"  (1.626 m), weight 37.6 kg, SpO2 98 %.    Frail cachetic elderly woman writhing   Worsening transaminitis Worsening renal function WBC up Had okay diuresis   A:  Worsening multiorgan dysfunction after thrombosed peripheral bypass graft requiring salvage L AKA and axillary-profunda femoral bypass; signs of re-thrombosis likely related to the pro-thrombotic milieu induced by COVID CAP vs. COVID ARDS Prior laryngectomy Profound muscular deconditioning and malnutrition POA Frail elderly DNR   P:  I think we need to have family come in and discuss transitioning to comfort care, there is not going to be a meaningful recovery here  Appreciate palliative help  Anticipate in hospital death  Patient critically ill due to respiratory failure Interventions to address this today family discussions, vent titration Risk of deterioration without these interventions is high    I personally spent 35 minutes providing critical care not including any separately billable procedures   Erskine Emery MD [facility] Prefer epic messenger for cross cover needs If after hours, please call E-link

## 2020-11-14 NOTE — Progress Notes (Addendum)
Progress Note    11/07/20 8:10 AM 3 Days Post-Op  Subjective:  Intubated/sedated. Dr. Darrick Meigs and attendant RN at bedside. Palliative care notes reviewed.   Vitals:   11/07/20 0600 11-07-2020 0745  BP: 95/67 (!) 132/93  Pulse:  60  Resp: (!) 31 (!) 24  Temp:    SpO2: 98% 100%    Physical Exam: General appearance: chronically and acutely ill; intubated/sedated Cardiac: Heart rate and rhythm are regular Respirations: Nonlabored Extremities: Left residual limb is blue with areas of skin slough. Unable to obtain  Doppler pulse in groin or in graft.   CBC    Component Value Date/Time   WBC 15.8 (H) November 07, 2020 0450   RBC 3.80 (L) November 07, 2020 0450   HGB 11.1 (L) November 07, 2020 0450   HCT 32.7 (L) November 07, 2020 0450   PLT 255 07-Nov-2020 0450   MCV 86.1 07-Nov-2020 0450   MCH 29.2 Nov 07, 2020 0450   MCHC 33.9 2020/11/07 0450   RDW 14.8 07-Nov-2020 0450   LYMPHSABS 0.5 (L) Nov 07, 2020 0450   MONOABS 0.6 11/07/20 0450   EOSABS 0.0 07-Nov-2020 0450   BASOSABS 0.0 11/07/20 0450    BMET    Component Value Date/Time   NA 135 Nov 07, 2020 0450   K 3.6 2020-11-07 0450   CL 98 Nov 07, 2020 0450   CO2 22 11/07/2020 0450   GLUCOSE 228 (H) 11-07-20 0450   BUN 21 11/07/20 0450   CREATININE 1.25 (H) 11-07-2020 0450   CALCIUM 7.2 (L) 2020-11-07 0450   GFRNONAA 45 (L) 11-07-20 0450   GFRAA >60 12/31/2015 0820     Intake/Output Summary (Last 24 hours) at 11-07-20 0810 Last data filed at 2020-11-07 0600 Gross per 24 hour  Intake 711.92 ml  Output 3220 ml  Net -2508.08 ml    HOSPITAL MEDICATIONS Scheduled Meds:  aspirin  81 mg Per Tube Daily   atorvastatin  10 mg Per Tube Daily   baricitinib  2 mg Oral Daily   chlorhexidine  15 mL Mouth Rinse BID   Chlorhexidine Gluconate Cloth  6 each Topical Daily   dexamethasone (DECADRON) injection  6 mg Intravenous Q24H   docusate sodium  100 mg Oral Daily   feeding supplement  237 mL Per Tube BID BM   folic acid  1 mg Oral Daily    gabapentin  400 mg Per Tube TID   heparin  5,000 Units Subcutaneous Q8H   insulin aspart  0-15 Units Subcutaneous TID WC   insulin aspart  0-5 Units Subcutaneous QHS   mouth rinse  15 mL Mouth Rinse q12n4p   multivitamin with minerals  1 tablet Oral Daily   pantoprazole (PROTONIX) IV  40 mg Intravenous QHS   polyethylene glycol  17 g Per Tube Daily   sodium chloride flush  10-40 mL Intracatheter Q12H   thiamine  100 mg Oral Daily   Continuous Infusions:  sodium chloride Stopped (10/21/20 1526)   dexmedetomidine (PRECEDEX) IV infusion 1.2 mcg/kg/hr (2020-11-07 0406)   dextrose 30 mL/hr at 10/22/20 1800   fentaNYL infusion INTRAVENOUS 200 mcg/hr (07-Nov-2020 0412)   magnesium sulfate bolus IVPB     norepinephrine (LEVOPHED) Adult infusion 5 mcg/min (10/22/20 2104)   piperacillin-tazobactam (ZOSYN)  IV 3.375 g (Nov 07, 2020 0541)   potassium chloride     PRN Meds:.acetaminophen **OR** acetaminophen, alum & mag hydroxide-simeth, chlorpheniramine-HYDROcodone, docusate sodium, fentaNYL, guaiFENesin-dextromethorphan, hydrALAZINE, labetalol, metoprolol tartrate, midazolam, midazolam, morphine injection, ondansetron, oxyCODONE-acetaminophen, phenol, polyethylene glycol, sodium chloride flush  Assessment and Plan: 75 y.o. female is s/p L ax-femoral bypass  with L AKA  POD 3 Suspect axillary to left femoral artery bypass is thrombosed. Left residual is non-viable. I understand family is considering comfort care and this seems reasonable and in accordance with her wishes and current clinical picture. Dr. Stanford Breed will assess.  -DVT prophylaxis:  heparin    Risa Grill, PA-C [facility] [phone] 2020/11/15  8:10 AM   VASCULAR STAFF ADDENDUM: I have independently interviewed and examined the patient. I agree with the above.  Ax-femoral bypass appears to have thrombosed clinically Left AKA stump is mottled No other options from a vascular standpoint. Recommend transition to  comfort measures only.  Yevonne Aline. Stanford Breed, MD [facility] Phone Number: [phone] 2020-11-15 11:20 AM

## 2020-11-14 NOTE — Procedures (Signed)
Extubation Procedure Note  Patient Details:   Name: Heather Jimenez DOB: Oct 05, 1945 MRN: [id number]   Airway Documentation:    Vent end date: 2020/11/07 Vent end time: 1502   Evaluation  O2 sats: transiently fell during during procedure Complications: No apparent complications Patient did tolerate procedure well. Bilateral Breath Sounds: Diminished   No  Patient was compassionately extubated to room air with family and RN at the bedside.   Aslan Himes, Eddie North November 07, 2020, 3:02 PM

## 2020-11-14 NOTE — Progress Notes (Signed)
PT Cancellation Note  Patient Details Name: Heather Jimenez MRN: [id number] DOB: [birth year]   Cancelled Treatment:    Reason Eval/Treat Not Completed: Other (comment) (pt/family have agreed to transition toward comfort care.).  Will sign off at this time. 11-11-2020  Ginger Carne., PT Acute Rehabilitation Services [phone]  (pager) [phone]  (office)   Tessie Fass Annalei Friesz 11/11/2020, 5:29 PM

## 2020-11-14 NOTE — Progress Notes (Signed)
Palliative Care Progress Note, Assessment & Plan   Patient Name: Heather Jimenez       Date: 10-24-20 DOB: Apr 19, 1945  Age: 75 y.o. MRN#: [id number] Attending Physician: Cherre Robins, MD Primary Care Physician: Cher Nakai, MD Admit Date: 10/26/2020  Reason for Consultation/Follow-up: Establishing goals of care and Pain control  Subjective: Patient is ventilated and sedated with inability to close her eyelids, hyperextension of her neck, decreased response to verbal or visual stimuli, and drooping of the nasolabial folds. She has intermittent non-purposeful UE movements but appears to be in NAD.  HPI: Heather Jimenez is a 75 y.o. female  with past medical history of throat cancer s/p laryngectomy with chronic tracheostomy, PAD, and tobacco abuse admitted on 10/31/2020 as a transfer from [facility]. Reason for transfer was 3 weeks of left lower extremity pain. Vascular was consulted for thrombosis of left aortofemoral bypass and left aortofemoral bypass limb to profunda femoris bypass - non-salvageable LLE. Pt found to be Covid19 positive on 10/21/20.   Palliative Medicine Team consulted for goals of care. Patient and family face ongoing treatment option decisions, advanced directive decisions, and anticipatory care needs. As of today, patient is a DNR.  Plan of Care: MD Christian and I were at patient's room and met with the patient's husband Mikki Santee and patient's HCPOA/son Audelia Acton this morning. Patient's son and husband visited with the patient while complying with Covid19 donning/doffing. After their visit, we met in the [facility] consult room with MD Darrick Meigs and PMT NP Jordan Hawks and NP Vinie Sill to discuss [place] and EOL.   We discussed clinical course as well as wishes moving forward in regards to the patient's  advanced directives. The husband brought the patient's living will to the hospital today. It clearly indicates that the patient would not want to prolong life if her prognosis is poor. We discussed the difference between an aggressive medical intervention path and a palliative, comfort focused care path.  Values and goals of care important to patient and family were attempted to be elicited. After much discussion and calls to multiple other family members the husband and son were in agreement that the patient would not want to live like this and that the breathing machine was the only thing keeping her alive at this point. With support from other family over the phone they were able to come to the difficult conclusion that we should not prolong her life in this state. This was a very difficult decision for them to make.   Dr. Darrick Meigs explained how multiple organs were shutting down and that the patient's BP is being fully supported by IV medications.  Even though we spoke in depth in the phone yesterday regarding code status and Palliative medicine, I re-introduced palliative care as specialized medical care for people living with serious illness. It focuses on providing relief from the symptoms and stress of a serious illness. I described to the patient's husband and son that we would do everything with medications and non-medical interventions to keep the patient clean and dry with dignity while also managing symptoms at EOL such as anxiety, dyspnea, and pain.   The family confirmed understanding that the ventilator and IV medications were  prolonging her life and that they wished to no longer have her suffer. They were appropriately tearful during our discussion. We reviewed that the transition to liberate her from the ventilator would happen today but that we would want Audelia Acton and Mikki Santee to speak with any and all friends and family members that needed to be notified prior to extubation.   Questions and  concerns addressed. Therapeutic listening provided. Emotional support provided. After our PMT will continue to support holistically.   Code Status: DNR  Prognosis:  Hours - Days  Discharge Planning: Anticipated Hospital Death  Recommendations/Plan: Full comfort care - extubation to room air once family speaks with family and friends to update them of patient's transition to EOL care Extubate to room air today Comfort care orders placed Unrestricted visitors  Care plan was discussed with MD Tamala Julian, [place], patient's Carollee Herter and patient's husband Cheryll Dessert of Stay: 3  Physical Exam Vitals and nursing note reviewed.  Constitutional:      Appearance: She is ill-appearing.  HENT:     Head:     Comments: Temporal wasting, nasolabial fold drooping Eyes:     Comments: Non-reactive, fixed gaze  Neck:     Comments: Hyperextension of neck Cardiovascular:     Rate and Rhythm: Normal rate.  Pulmonary:     Comments: Ventilated and sedated Musculoskeletal:     Comments: Bilateral LE amputations  Neurological:     Comments: Decreased to no response to verbal or visual stimuli            Vital Signs: BP (!) 132/93   Pulse 60   Temp 98.2 F (36.8 C) (Axillary)   Resp (!) 24   Ht _0  (1.626 m)   Wt 37.6 kg   SpO2 100%   BMI 14.23 kg/m  SpO2: SpO2: 100 % O2 Device: O2 Device: Ventilator O2 Flow Rate: O2 Flow Rate (L/min): 14 L/min      Total Time 1100-1300 120 minutes Prolonged Time Billed  yes   Greater than 50%  of this time was spent counseling and coordinating care related to the above assessment and plan.  Thank you for allowing the Palliative Medicine Team to assist in the care of this patient.  Vinie Sill, NP Palliative Medicine Team Pager [phone] (Please see [email or web address] for schedule) Team Phone [phone]    Greater than 50%  of this time was spent counseling and coordinating care related to the above assessment and plan

## 2020-11-14 NOTE — Death Summary Note (Signed)
DEATH SUMMARY   Patient Details  Name: Heather Jimenez MRN: [id number] DOB: [birth year]  Admission/Discharge Information   Admit Date:  11-02-2020  Date of Death: Date of Death: 11-05-20  Time of Death: Time of Death: 06/05/09  Length of Stay: 3  Referring Physician: Cher Nakai, Jimenez   Reason(s) for Hospitalization  COVID ARDS Active Problems:   Aortobifemoral bypass graft thrombosis (HCC)   Ischemic leg Throat cancer post laryngectomy Severe protein calorie malnutrition POA Frail elderly  Brief Hospital Course (including significant findings, care, treatment, and services provided and events leading to death)  Heather Jimenez is a 75 year old woman with throat cancer s/p tracheostomy, peripheral arterial disease, tobacco use who was transferred from [facility] for 3 weeks of left lower extremity pain. She has been admitted by Vascular Surgery for thrombosis of left aortofemoral bypass and left aortofemoral bypass limb to profunda femoris bypass causing non-salvageable left lower extremity. She is status post left axillary - profunda femoris bypass and left above the knee amputation.  Found to have COVID ARDS.   Despite ventilator support, empiric antibiotics she developed recurrent ischemia of left limb thought to represent graft failure.  Due to this and worsening multiorgan failure along with baseline severe malnourished deconditioned state, family meeting held and all agreed to allow her to pass in peace.    Pertinent Labs and Studies  Significant Diagnostic Studies DG Chest 1 View  Result Date: 10/21/2020 CLINICAL DATA:  History of ET tube EXAM: CHEST  1 VIEW COMPARISON:  10/21/2020 FINDINGS: Tracheostomy is in place with the tip in the lower trachea. Right internal jugular dialysis catheter is unchanged. No pneumothorax. Bilateral airspace disease and layering effusions, similar to prior study. IMPRESSION: Interval placement of tracheostomy with the tip in the lower trachea. No  pneumothorax. Bilateral airspace disease and layering effusions, unchanged. Electronically Signed   By: Heather Jimenez M.D.   On: 10/21/2020 17:51   DG Chest Port 1 View  Result Date: Nov 05, 2020 CLINICAL DATA:  ARDS.  COVID positive. EXAM: PORTABLE CHEST 1 VIEW COMPARISON:  Radiograph 10/22/2020 FINDINGS: Tracheostomy tube and central venous line unchanged. Improved airspace disease in LEFT lower lobe. Persistent RIGHT lower lobe airspace disease. No pneumothorax. No upper lobe pulmonary edema. IMPRESSION: 1. Improvement in airspace disease in the LEFT lower lobe. Persistent RIGHT lower lobe airspace disease. 2. Stable support apparatus. Electronically Signed   By: Heather Jimenez M.D.   On: 2020/11/05 13:40   DG CHEST PORT 1 VIEW  Result Date: 10/22/2020 CLINICAL DATA:  Reason for exam: status post thoracentesis EXAM: PORTABLE CHEST 1 VIEW COMPARISON:  10/21/2020 FINDINGS: Tracheostomy 2 central venous line unchanged. Stable cardiac silhouette. Bilateral lower lower lobe airspace disease again noted. No pneumothorax. Small effusions. IMPRESSION: 1. No significant change. 2. Stable support apparatus. 3. Bibasilar airspace disease suggesting pneumonia Electronically Signed   By: Heather Jimenez M.D.   On: 10/22/2020 12:51   DG CHEST PORT 1 VIEW  Result Date: 10/21/2020 CLINICAL DATA:  Respiratory failure.  COVID positive. EXAM: PORTABLE CHEST 1 VIEW COMPARISON:  Nov 02, 2020. FINDINGS: Right IJ central venous catheter with the tip projecting at the expected location of the SVC. Increased moderate left pleural effusion and similar small layering right pleural effusion. No visible pneumothorax on this limited semi erect radiograph. Increased left greater than right interstitial and airspace opacities. Enlarged cardiac silhouette, partially obscured and accentuated by low lung volumes and AP technique. Calcific atherosclerosis of the aorta. Left upper chest/lower neck clips. Polyarticular degenerative change.  IMPRESSION: 1. Increased moderate left pleural effusion and similar small layering right pleural effusion. 2. Increased left greater than right interstitial and airspace opacities, which could represent infection and/or edema. 3. Cardiomegaly. Electronically Signed   By: Heather Jimenez   On: 10/21/2020 11:12   DG CHEST PORT 1 VIEW  Result Date: 11/01/2020 CLINICAL DATA:  Repositioned endotracheal tube EXAM: PORTABLE CHEST 1 VIEW COMPARISON:  10/28/2020, 08/15/2020 FINDINGS: Endotracheal tube tip at carina, slightly above orifice of right mainstem bronchus. Right IJ central venous catheter tip over the SVC. Increased bilateral pleural effusions and basilar airspace disease. Stable cardiomediastinal silhouette with vascular congestion and worsening pulmonary edema. Aortic atherosclerosis. No pneumothorax. Additional linear monitoring device over the upper mediastinum. IMPRESSION: 1. Endotracheal tube tip at the carina, just above the orifice of right mainstem bronchus 2. Increased bilateral pleural effusions and basilar airspace disease. Increasing vascular congestion and pulmonary edema. Electronically Signed   By: Donavan Foil M.D.   On: 11/10/2020 21:44   DG Chest Port 1 View  Result Date: 11/06/2020 CLINICAL DATA:  Respiratory failure EXAM: PORTABLE CHEST 1 VIEW COMPARISON:  08/22/2020 FINDINGS: Endotracheal tube tip is at the level of the carina. Recommend retracting 5 cm. Right IJ approach central venous catheter tip projects in the lower SVC. There is bilateral perihilar opacity, left-greater-than-right, likely pulmonary edema. Small left pleural effusion. IMPRESSION: Endotracheal tube tip at the level of the carina. Recommend retracting 5 cm. Electronically Signed   By: Ulyses Jarred M.D.   On: 10/30/2020 20:13   ECHOCARDIOGRAM LIMITED  Result Date: 10/22/2020    ECHOCARDIOGRAM LIMITED REPORT   Patient Name:   LOUCILLE Jimenez Date of Exam: 10/22/2020 Medical Rec #:  [id number]       Height:        64.0 in Accession #:    [id number]      Weight:       82.9 lb Date of Birth:  [birth year]        BSA:          1.345 m Patient Age:    10 years        BP:           104/79 mmHg Patient Gender: F               HR:           71 bpm. Exam Location:  Inpatient Procedure: 2D Echo, Cardiac Doppler, Intracardiac Opacification Agent, Limited            Echo and Limited Color Doppler Indications:    Shock  History:        Patient has no prior history of Echocardiogram examinations.                 Respiratory failure. Ventilated through nect stoma. Covid 19                 positive. Malnutrition.  Sonographer:    Merrie Roof RDCS Referring Phys: [id number] [place]  1. Left ventricular ejection fraction, by estimation, is 40 to 45%. The left ventricle has mildly decreased function. The left ventricle demonstrates regional wall motion abnormalities (see scoring diagram/findings for description).  2. Right ventricular systolic function is normal. The right ventricular size is normal. There is normal pulmonary artery systolic pressure. The estimated right ventricular systolic pressure is 35.3 mmHg.  3. Left atrial size was moderately dilated.  4. The mitral valve is normal in structure. Mild to moderate  mitral valve regurgitation. No evidence of mitral stenosis.  5. Tricuspid valve regurgitation is severe.  6. The aortic valve is tricuspid. There is moderate calcification of the aortic valve. There is moderate thickening of the aortic valve. Aortic valve regurgitation is not visualized. Mild to moderate aortic valve sclerosis/calcification is present, without any evidence of aortic stenosis.  7. The inferior vena cava is dilated in size with <50% respiratory variability, suggesting right atrial pressure of 15 mmHg. FINDINGS  Left Ventricle: Left ventricular ejection fraction, by estimation, is 40 to 45%. The left ventricle has mildly decreased function. The left ventricle demonstrates regional wall motion  abnormalities. The left ventricular internal cavity size was normal in size. There is no left ventricular hypertrophy.  LV Wall Scoring: The basal inferolateral segment, basal anterolateral segment, apical septal segment, basal inferior segment, and apical inferior segment are akinetic. Right Ventricle: The right ventricular size is normal. No increase in right ventricular wall thickness. Right ventricular systolic function is normal. There is normal pulmonary artery systolic pressure. The tricuspid regurgitant velocity is 2.78 m/s, and  with an assumed right atrial pressure of 3 mmHg, the estimated right ventricular systolic pressure is 53.9 mmHg. Left Atrium: Left atrial size was moderately dilated. Right Atrium: Right atrial size was normal in size. Pericardium: There is no evidence of pericardial effusion. Mitral Valve: The mitral valve is normal in structure. Mild to moderate mitral valve regurgitation. No evidence of mitral valve stenosis. Tricuspid Valve: The tricuspid valve is normal in structure. Tricuspid valve regurgitation is severe. No evidence of tricuspid stenosis. Aortic Valve: The aortic valve is tricuspid. There is moderate calcification of the aortic valve. There is moderate thickening of the aortic valve. Aortic valve regurgitation is not visualized. Mild to moderate aortic valve sclerosis/calcification is present, without any evidence of aortic stenosis. Pulmonic Valve: The pulmonic valve was normal in structure. Pulmonic valve regurgitation is not visualized. No evidence of pulmonic stenosis. Aorta: The aortic root is normal in size and structure. Venous: The inferior vena cava is dilated in size with less than 50% respiratory variability, suggesting right atrial pressure of 15 mmHg. IAS/Shunts: No atrial level shunt detected by color flow Doppler. LEFT VENTRICLE PLAX 2D LVIDd:         3.10 cm     Diastology LVIDs:         2.80 cm     LV e' medial:    3.19 cm/s LV PW:         0.80 cm     LV  E/e' medial:  25.8 LV IVS:        0.80 cm     LV e' lateral:   3.75 cm/s LVOT diam:     2.00 cm     LV E/e' lateral: 21.9 LV SV:         21 LV SV Index:   16 LVOT Area:     3.14 cm  LV Volumes (MOD) LV vol d, MOD A2C: 61.3 ml LV vol d, MOD A4C: 61.4 ml LV vol s, MOD A2C: 38.0 ml LV vol s, MOD A4C: 29.3 ml LV SV MOD A2C:     23.3 ml LV SV MOD A4C:     61.4 ml LV SV MOD BP:      27.9 ml RIGHT VENTRICLE          IVC RV Basal diam:  3.10 cm  IVC diam: 1.90 cm LEFT ATRIUM  Index       RIGHT ATRIUM           Index LA diam:        2.90 cm 2.16 cm/m  RA Area:     13.80 cm LA Vol (A2C):   60.1 ml 44.67 ml/m RA Volume:   33.40 ml  24.82 ml/m LA Vol (A4C):   46.3 ml 34.41 ml/m LA Biplane Vol: 56.9 ml 42.29 ml/m  AORTIC VALVE LVOT Vmax:   36.00 cm/s LVOT Vmean:  22.000 cm/s LVOT VTI:    0.067 m  AORTA Ao Root diam: 2.70 cm MITRAL VALVE               TRICUSPID VALVE MV Area (PHT): 4.86 cm    TR Peak grad:   30.9 mmHg MV Decel Time: 156 msec    TR Vmax:        278.00 cm/s MV E velocity: 82.30 cm/s MV A velocity: 68.10 cm/s  SHUNTS MV E/A ratio:  1.21        Systemic VTI:  0.07 m                            Systemic Diam: 2.00 cm Candee Furbish Jimenez Electronically signed by Candee Furbish Jimenez Signature Date/Time: 10/22/2020/1:35:34 PM    Final     Microbiology Recent Results (from the past 240 hour(s))  SARS CORONAVIRUS 2 (TAT 6-24 HRS) Nasopharyngeal Nasopharyngeal Swab     Status: Abnormal   Collection Time: 10/30/2020  6:39 PM   Specimen: Nasopharyngeal Swab  Result Value Ref Range Status   SARS Coronavirus 2 POSITIVE (A) NEGATIVE Final    Comment: (NOTE) SARS-CoV-2 target nucleic acids are DETECTED.  The SARS-CoV-2 RNA is generally detectable in upper and lower respiratory specimens during the acute phase of infection. Positive results are indicative of the presence of SARS-CoV-2 RNA. Clinical correlation with patient history and other diagnostic information is  necessary to determine patient infection  status. Positive results do not rule out bacterial infection or co-infection with other viruses.  The expected result is Negative.  Fact Sheet for Patients: SugarRoll.be  Fact Sheet for Healthcare Providers: https://www.woods-mathews.com/  This test is not yet approved or cleared by the [place] FDA and  has been authorized for detection and/or diagnosis of SARS-CoV-2 by FDA under an Emergency Use Authorization (EUA). This EUA will remain  in effect (meaning this test can be used) for the duration of the COVID-19 declaration under Section 564(b)(1) of the Act, 21 U. S.C. section 360bbb-3(b)(1), unless the authorization is terminated or revoked sooner.   Performed at [facility], [place] [address] [place], [place] [address]   Surgical pcr screen     Status: None   Collection Time: 10/25/2020  6:56 PM   Specimen: Nasal Mucosa; Nasal Swab  Result Value Ref Range Status   MRSA, PCR NEGATIVE NEGATIVE Final   Staphylococcus aureus NEGATIVE NEGATIVE Final    Comment: (NOTE) The Xpert SA Assay (FDA approved for NASAL specimens in patients 33 years of age and older), is one component of a comprehensive surveillance program. It is not intended to diagnose infection nor to guide or monitor treatment. Performed at [facility], [place] [address] [place], [place] [address]   Resp Panel by RT-PCR (Flu A&B, Covid)     Status: Abnormal   Collection Time: 10/21/20  9:41 AM  Result Value Ref Range Status   SARS  Coronavirus 2 by RT PCR POSITIVE (A) NEGATIVE Final    Comment: RESULT CALLED TO, READ BACK BY AND VERIFIED WITH: RN Joellyn Haff 10/21/20@12 :04 BY TW (NOTE) SARS-CoV-2 target nucleic acids are DETECTED.  The SARS-CoV-2 RNA is generally detectable in upper respiratory specimens during the acute phase of infection. Positive results are indicative of the presence of the identified virus, but do not rule out bacterial infection or  co-infection with other pathogens not detected by the test. Clinical correlation with patient history and other diagnostic information is necessary to determine patient infection status. The expected result is Negative.  Fact Sheet for Patients: EntrepreneurPulse.com.au  Fact Sheet for Healthcare Providers: IncredibleEmployment.be  This test is not yet approved or cleared by the [place] FDA and  has been authorized for detection and/or diagnosis of SARS-CoV-2 by FDA under an Emergency Use Authorization (EUA).  This EUA will remain in effect (meaning this test can  be used) for the duration of  the COVID-19 declaration under Section 564(b)(1) of the Act, 21 U.S.C. section 360bbb-3(b)(1), unless the authorization is terminated or revoked sooner.     Influenza A by PCR NEGATIVE NEGATIVE Final   Influenza B by PCR NEGATIVE NEGATIVE Final    Comment: (NOTE) The Xpert Xpress SARS-CoV-2/FLU/RSV plus assay is intended as an aid in the diagnosis of influenza from Nasopharyngeal swab specimens and should not be used as a sole basis for treatment. Nasal washings and aspirates are unacceptable for Xpert Xpress SARS-CoV-2/FLU/RSV testing.  Fact Sheet for Patients: EntrepreneurPulse.com.au  Fact Sheet for Healthcare Providers: IncredibleEmployment.be  This test is not yet approved or cleared by the [place] FDA and has been authorized for detection and/or diagnosis of SARS-CoV-2 by FDA under an Emergency Use Authorization (EUA). This EUA will remain in effect (meaning this test can be used) for the duration of the COVID-19 declaration under Section 564(b)(1) of the Act, 21 U.S.C. section 360bbb-3(b)(1), unless the authorization is terminated or revoked.  Performed at [facility], [place] [address], [place], [place] [address]   Body fluid culture w Gram Stain     Status: None (Preliminary result)    Collection Time: 10/22/20  8:54 AM   Specimen: Fluid  Result Value Ref Range Status   Specimen Description FLUID  Final   Special Requests NONE  Final   Gram Stain   Final    WBC PRESENT, PREDOMINANTLY PMN NO ORGANISMS SEEN CYTOSPIN SMEAR    Culture   Final    NO GROWTH < 24 HOURS Performed at [facility], [place] [address], [place], [place] [address]    Report Status PENDING  Incomplete    Lab Basic Metabolic Panel: Recent Labs  Lab 10/29/2020 1627 10/22/2020 1738 10/24/2020 1832 10/22/2020 2037 10/21/20 0330 10/21/20 1727 10/22/20 0443 2020-10-28 0450 October 28, 2020 0853  NA 135   < > 134*   < > 132* 138 136 135 136  K 4.2   < > 5.1   < > 4.2 3.8 3.4* 3.6 3.3*  CL 105  --  106  --  107  --  104 98  --   CO2  --   --  18*  --  16*  --  23 22  --   GLUCOSE 124*  --  318*  --  277*  --  119* 228*  --   BUN 6*  --  8  --  9  --  13 21  --   CREATININE 0.30*  --  0.65  --  0.74  --  0.70 1.25*  --   CALCIUM  --   --  8.6*  --  7.3*  --  6.8* 7.2*  --   MG  --   --  1.6*  --  1.7  --  2.1 1.7  --   PHOS  --   --  6.8*  --  4.5  --  3.2 3.2  --    < > = values in this interval not displayed.   Liver Function Tests: Recent Labs  Lab 11/13/2020 1832 10/22/20 0443 11-20-20 0450  AST 76* 266* 522*  ALT 22 27 18   ALKPHOS 71 49 65  BILITOT 1.1 0.6 1.1  PROT 4.4* 4.8* 4.8*  ALBUMIN 2.3* 3.5 3.0*   No results for input(s): LIPASE, AMYLASE in the last 168 hours. No results for input(s): AMMONIA in the last 168 hours. CBC: Recent Labs  Lab 11/07/2020 1832 10/22/2020 2037 10/21/20 0330 10/21/20 1727 10/22/20 0849 11-20-2020 0450 11/20/2020 0853  WBC 18.0*  --  20.7*  --  12.4* 15.8*  --   NEUTROABS 14.9*  --   --   --  11.1* 14.5*  --   HGB 12.0   < > 12.3 10.5* 10.0* 11.1* 12.2  HCT 38.2   < > 36.7 31.0* 29.7* 32.7* 36.0  MCV 93.6  --  89.1  --  88.1 86.1  --   PLT 320  --  308  --  249 255  --    < > = values in this interval not displayed.   Cardiac Enzymes: No results for  input(s): CKTOTAL, CKMB, CKMBINDEX, TROPONINI in the last 168 hours. Sepsis Labs: Recent Labs  Lab 11/06/2020 1832 10/26/2020 1833 11/01/2020 2133 10/21/20 0020 10/21/20 0330 10/22/20 0849 2020/11/20 0450  PROCALCITON  --   --   --   --   --  1.17  --   WBC 18.0*  --   --   --  20.7* 12.4* 15.8*  LATICACIDVEN  --  5.0* 4.4* 2.5*  --   --   --     Candee Furbish 20-Nov-2020, 5:37 PM

## 2020-11-14 DEATH — deceased

## 2022-07-14 IMAGING — CT CT ABD-PELV W/ CM
2 of 7 series · 14 of 46 positions shown, 18 images · IV contrast (Omni 300)
Comparison: December 29, 2015.

CLINICAL DATA: Left lower quadrant abdominal pain.

EXAM:
CT ABDOMEN AND PELVIS WITH CONTRAST
TECHNIQUE: Multidetector CT imaging of the abdomen and pelvis was performed
using the standard protocol following bolus administration of
intravenous contrast.
CONTRAST:  75mL OMNIPAQUE IOHEXOL 300 MG/ML  SOLN

[Series 3: a/p w/ 5mm · axial · 0.61mm/px · z∈[+800,+1174]mm · 11 of 86 slices shown, 15 images]
[im 6/86  soft-tissue]
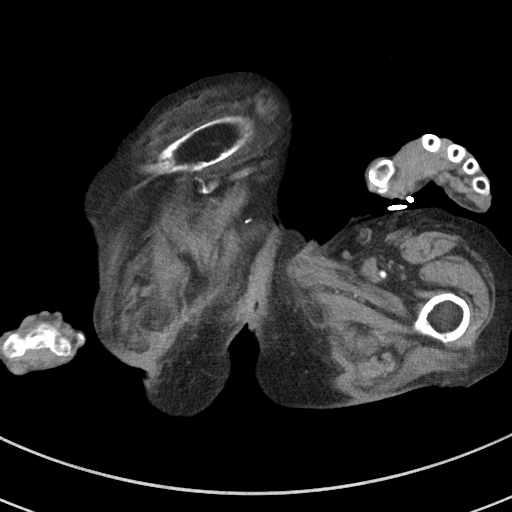
[im 6/86  bone]
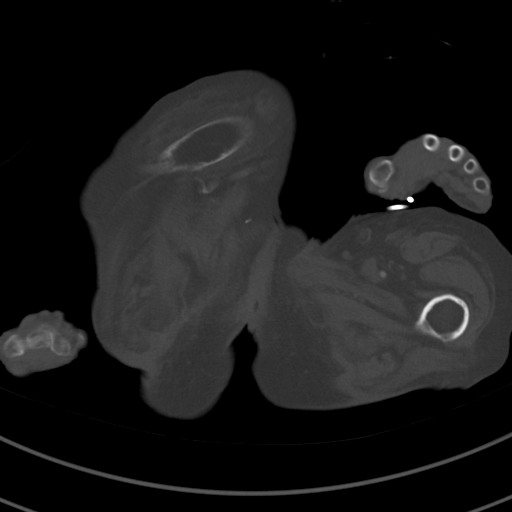
[im 16/86  soft-tissue]
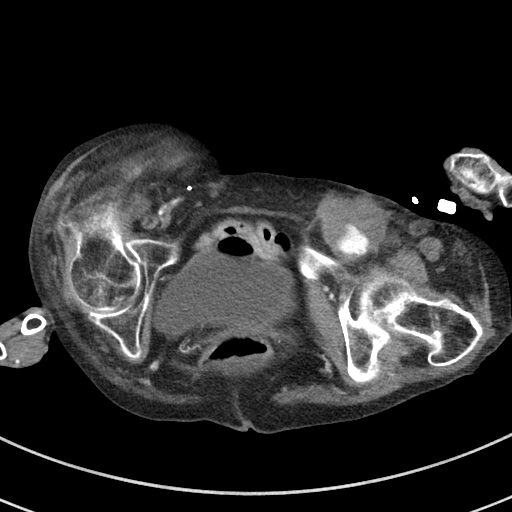
[im 26/86  soft-tissue]
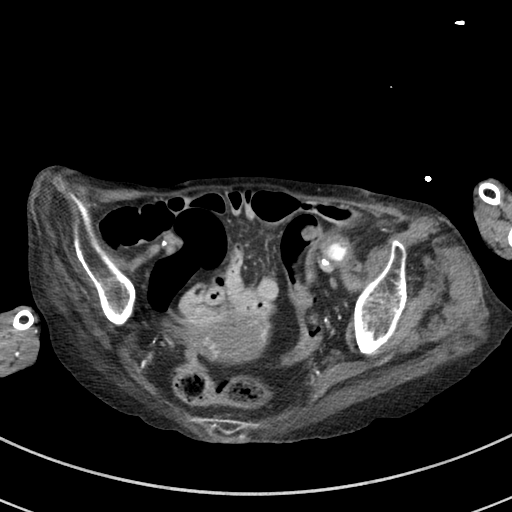
[im 36/86  soft-tissue]
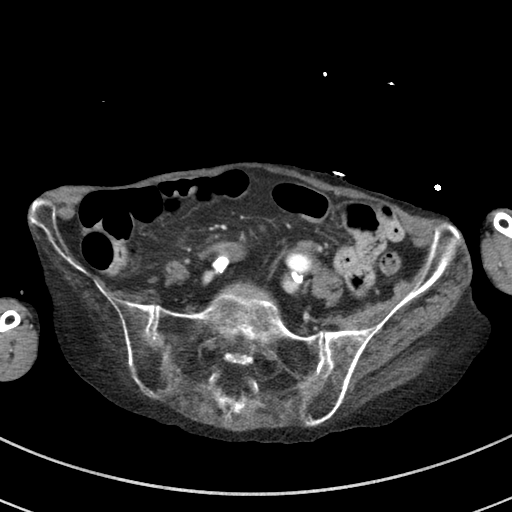
[im 46/86  soft-tissue]
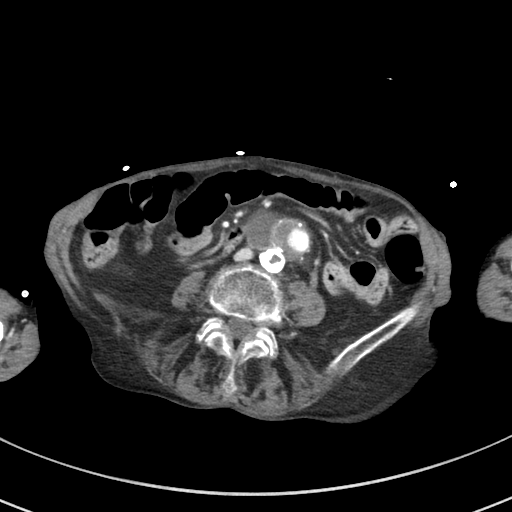
[im 51/86  soft-tissue]
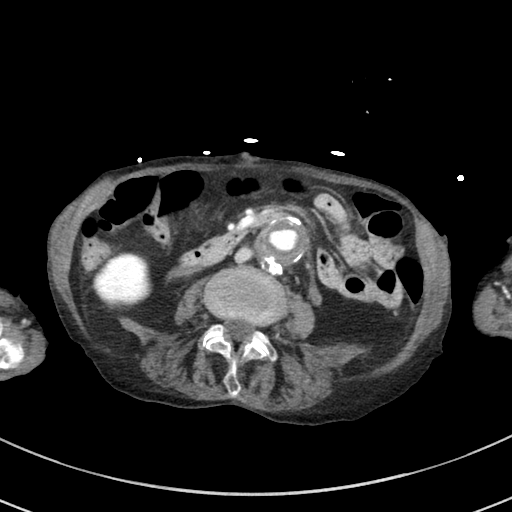
[im 61/86  soft-tissue]
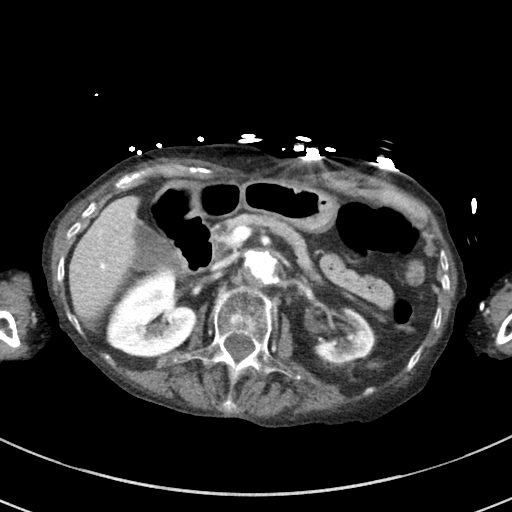
[im 66/86  lung]
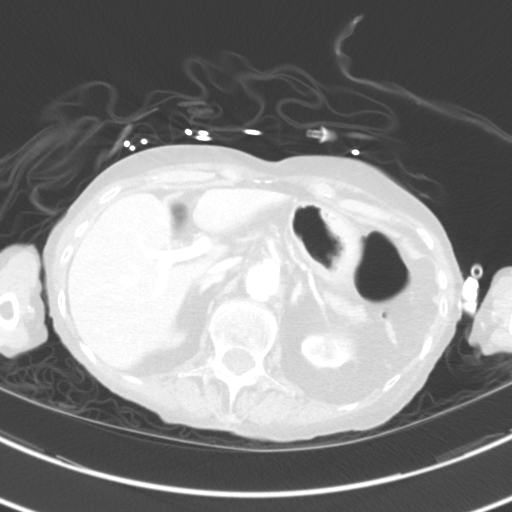
[im 71/86  soft-tissue]
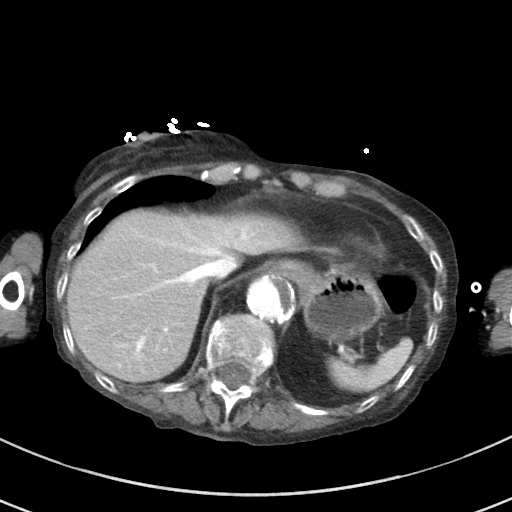
[im 71/86  lung]
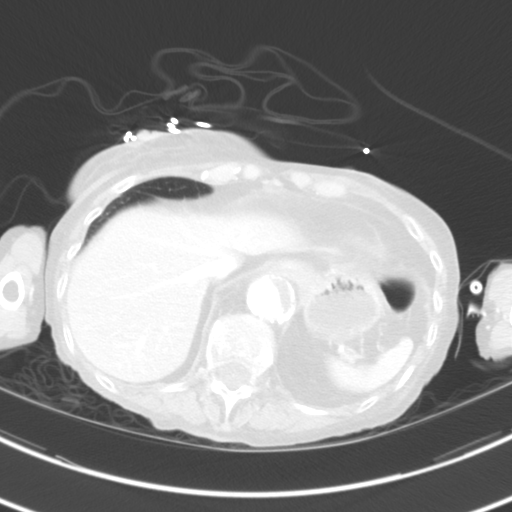
[im 76/86  lung]
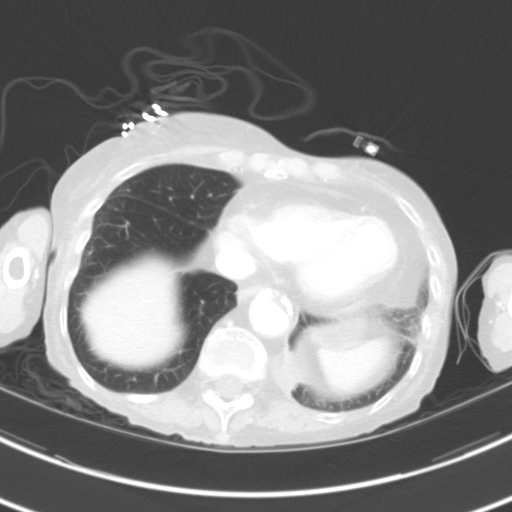
[im 81/86  soft-tissue]
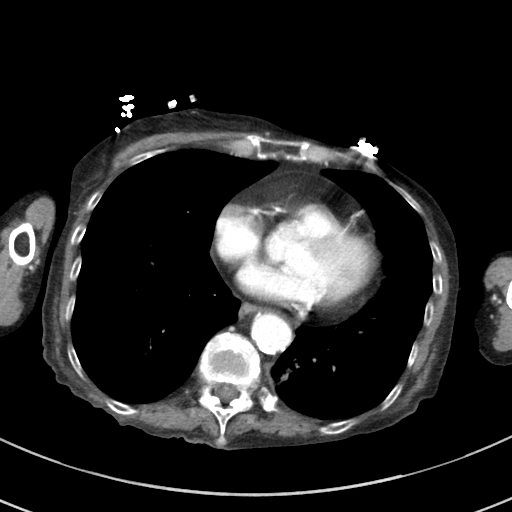
[im 81/86  lung]
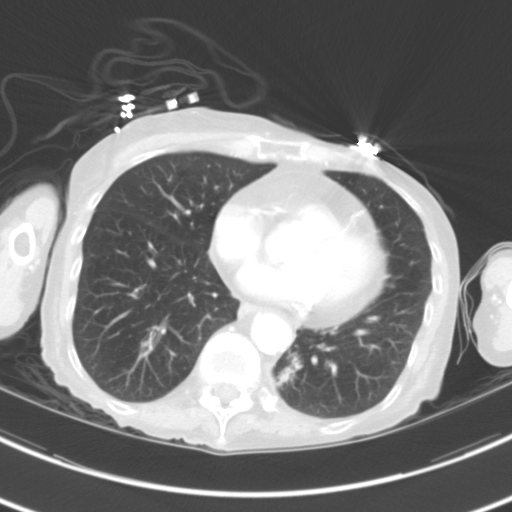
[im 81/86  bone]
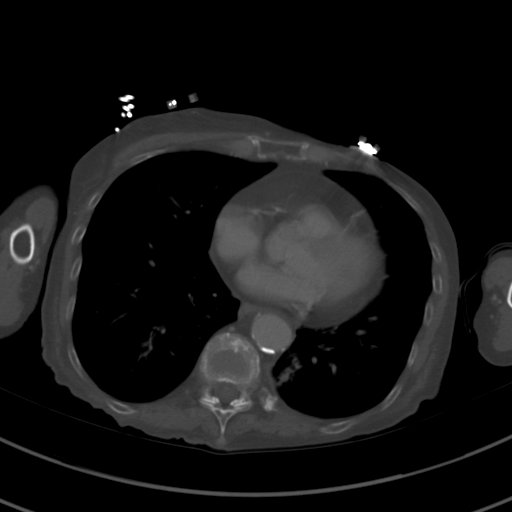

[Series 7: a/p w/ cor · coronal · 0.75mm/px · 3 of 112 slices shown]
[im 28/112  soft-tissue]
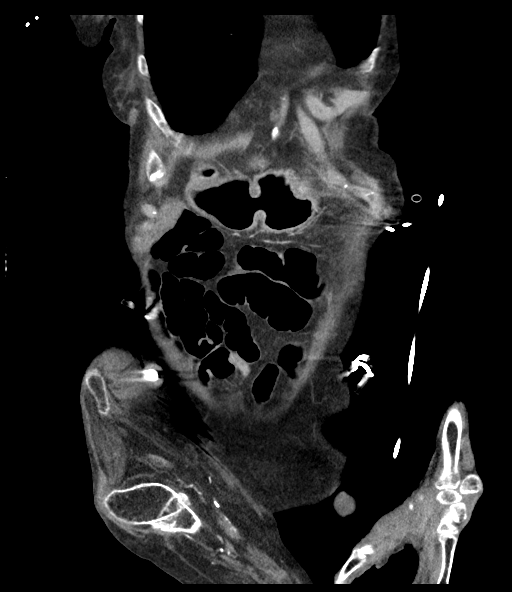
[im 56/112  soft-tissue]
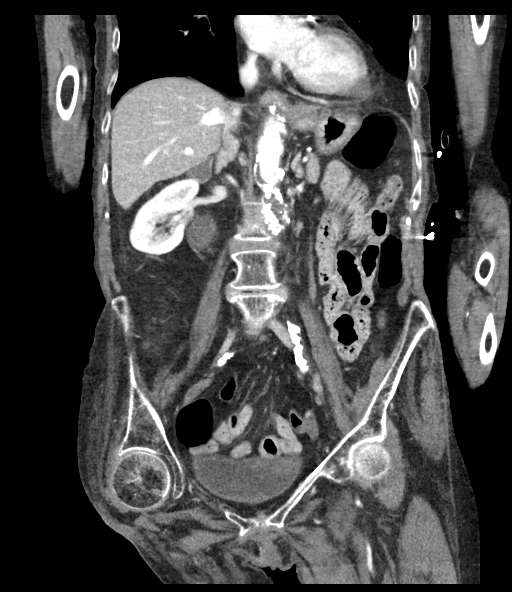
[im 84/112  soft-tissue]
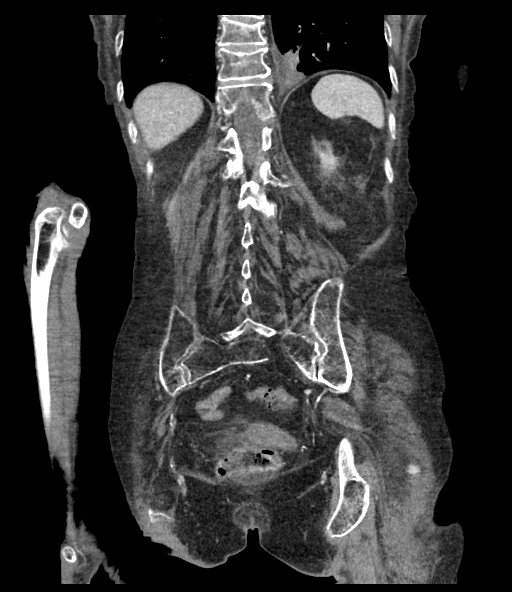

[14 of 46 positions shown; findings below may reference images not displayed]

FINDINGS: Lower chest: No acute abnormality.

Hepatobiliary: No focal liver abnormality is seen. No gallstones,
gallbladder wall thickening, or biliary dilatation.

Pancreas: Unremarkable. No pancreatic ductal dilatation or
surrounding inflammatory changes.

Spleen: Normal in size without focal abnormality.

Adrenals/Urinary Tract: Adrenal glands appear normal. Left renal
atrophy is noted. No hydronephrosis or renal obstruction is noted.
Urinary bladder is unremarkable.

Stomach/Bowel: Stomach is within normal limits. Appendix appears
normal. No evidence of bowel wall thickening, distention, or
inflammatory changes.

Vascular/Lymphatic: Status post aortobifemoral graft placement.
There is occlusion of the right limb of the graft as well as
occlusion of the right common, internal and external iliac arteries.
Left common femoral artery pseudoaneurysm is again noted measuring 4
x 3 cm which is significantly smaller compared to prior exam. No
significant adenopathy is noted. There is interval development of
what appears to be 3.8 x 2.5 cm saccular aneurysm formation
involving the excluded aneurysmal sac at the level of the aortic
bifurcation. It is not seen to fill with contrast.

Reproductive: Uterus and bilateral adnexa are unremarkable.

Other: No abdominal wall hernia or abnormality. No abdominopelvic
ascites.

Musculoskeletal: No acute or significant osseous findings.
IMPRESSION: Left renal atrophy.

Status post aortobifemoral graft placement. Occlusion of the right
limb of the graft is noted as well as chronic occlusion of the right
common, internal and external iliac arteries.

There is interval development of 3.8 x 2.5 cm probable saccular
aneurysm involving the excluded aneurysmal sac at the level of
aortic bifurcation. It is not seen to fill with contrast.

Left common femoral artery pseudoaneurysm noted on prior exam is
significantly smaller currently, measuring 4 x 3 cm.
